# Patient Record
Sex: Male | Born: 1955 | Race: Black or African American | Hispanic: No | Smoking: Former smoker
Health system: Southern US, Community
[De-identification: ages and names within clinical notes are randomized; demographics above are authoritative.]

## PROBLEM LIST (undated history)

## (undated) DIAGNOSIS — K219 Gastro-esophageal reflux disease without esophagitis: Secondary | ICD-10-CM

## (undated) DIAGNOSIS — I10 Essential (primary) hypertension: Secondary | ICD-10-CM

## (undated) DIAGNOSIS — K760 Fatty (change of) liver, not elsewhere classified: Secondary | ICD-10-CM

## (undated) DIAGNOSIS — K831 Obstruction of bile duct: Secondary | ICD-10-CM

## (undated) DIAGNOSIS — E785 Hyperlipidemia, unspecified: Secondary | ICD-10-CM

## (undated) DIAGNOSIS — E119 Type 2 diabetes mellitus without complications: Secondary | ICD-10-CM

## (undated) HISTORY — DX: Hyperlipidemia, unspecified: E78.5

## (undated) HISTORY — PX: CATARACT EXTRACTION: SUR2

## (undated) HISTORY — DX: Obstruction of bile duct: K83.1

## (undated) HISTORY — DX: Gastro-esophageal reflux disease without esophagitis: K21.9

## (undated) HISTORY — DX: Type 2 diabetes mellitus without complications: E11.9

## (undated) HISTORY — DX: Fatty (change of) liver, not elsewhere classified: K76.0

## (undated) HISTORY — DX: Essential (primary) hypertension: I10

## (undated) HISTORY — PX: NECK SURGERY: SHX720

---

## 2015-06-09 DIAGNOSIS — M25562 Pain in left knee: Secondary | ICD-10-CM | POA: Diagnosis not present

## 2015-06-19 DIAGNOSIS — M47812 Spondylosis without myelopathy or radiculopathy, cervical region: Secondary | ICD-10-CM | POA: Diagnosis not present

## 2015-06-19 DIAGNOSIS — M5412 Radiculopathy, cervical region: Secondary | ICD-10-CM | POA: Diagnosis not present

## 2015-06-21 DIAGNOSIS — Z01810 Encounter for preprocedural cardiovascular examination: Secondary | ICD-10-CM | POA: Diagnosis not present

## 2015-06-21 DIAGNOSIS — Z01818 Encounter for other preprocedural examination: Secondary | ICD-10-CM | POA: Diagnosis not present

## 2015-06-21 DIAGNOSIS — M79603 Pain in arm, unspecified: Secondary | ICD-10-CM | POA: Diagnosis not present

## 2015-06-21 DIAGNOSIS — Z79899 Other long term (current) drug therapy: Secondary | ICD-10-CM | POA: Diagnosis not present

## 2015-06-21 DIAGNOSIS — R52 Pain, unspecified: Secondary | ICD-10-CM | POA: Diagnosis not present

## 2015-07-05 DIAGNOSIS — M47892 Other spondylosis, cervical region: Secondary | ICD-10-CM | POA: Diagnosis not present

## 2015-07-05 DIAGNOSIS — M50123 Cervical disc disorder at C6-C7 level with radiculopathy: Secondary | ICD-10-CM | POA: Diagnosis not present

## 2015-07-05 DIAGNOSIS — Z79899 Other long term (current) drug therapy: Secondary | ICD-10-CM | POA: Diagnosis not present

## 2015-07-05 DIAGNOSIS — E119 Type 2 diabetes mellitus without complications: Secondary | ICD-10-CM | POA: Diagnosis not present

## 2015-07-05 DIAGNOSIS — Z23 Encounter for immunization: Secondary | ICD-10-CM | POA: Diagnosis not present

## 2015-07-05 DIAGNOSIS — Z87891 Personal history of nicotine dependence: Secondary | ICD-10-CM | POA: Diagnosis not present

## 2015-07-05 DIAGNOSIS — M5412 Radiculopathy, cervical region: Secondary | ICD-10-CM | POA: Diagnosis not present

## 2015-07-05 DIAGNOSIS — E785 Hyperlipidemia, unspecified: Secondary | ICD-10-CM | POA: Diagnosis not present

## 2015-07-05 DIAGNOSIS — Z7982 Long term (current) use of aspirin: Secondary | ICD-10-CM | POA: Diagnosis not present

## 2015-07-05 DIAGNOSIS — M5013 Cervical disc disorder with radiculopathy, cervicothoracic region: Secondary | ICD-10-CM | POA: Diagnosis not present

## 2015-07-06 DIAGNOSIS — Z87891 Personal history of nicotine dependence: Secondary | ICD-10-CM | POA: Diagnosis not present

## 2015-07-06 DIAGNOSIS — Z79899 Other long term (current) drug therapy: Secondary | ICD-10-CM | POA: Diagnosis not present

## 2015-07-06 DIAGNOSIS — Z23 Encounter for immunization: Secondary | ICD-10-CM | POA: Diagnosis not present

## 2015-07-06 DIAGNOSIS — M5013 Cervical disc disorder with radiculopathy, cervicothoracic region: Secondary | ICD-10-CM | POA: Diagnosis not present

## 2015-07-06 DIAGNOSIS — E119 Type 2 diabetes mellitus without complications: Secondary | ICD-10-CM | POA: Diagnosis not present

## 2015-07-06 DIAGNOSIS — E785 Hyperlipidemia, unspecified: Secondary | ICD-10-CM | POA: Diagnosis not present

## 2015-07-06 DIAGNOSIS — Z7982 Long term (current) use of aspirin: Secondary | ICD-10-CM | POA: Diagnosis not present

## 2015-07-19 DIAGNOSIS — Z0000 Encounter for general adult medical examination without abnormal findings: Secondary | ICD-10-CM | POA: Diagnosis not present

## 2015-09-26 DIAGNOSIS — M5412 Radiculopathy, cervical region: Secondary | ICD-10-CM | POA: Diagnosis not present

## 2015-11-22 DIAGNOSIS — Z1389 Encounter for screening for other disorder: Secondary | ICD-10-CM | POA: Diagnosis not present

## 2015-11-22 DIAGNOSIS — E1165 Type 2 diabetes mellitus with hyperglycemia: Secondary | ICD-10-CM | POA: Diagnosis not present

## 2015-11-22 DIAGNOSIS — N400 Enlarged prostate without lower urinary tract symptoms: Secondary | ICD-10-CM | POA: Diagnosis not present

## 2015-11-22 DIAGNOSIS — E782 Mixed hyperlipidemia: Secondary | ICD-10-CM | POA: Diagnosis not present

## 2015-12-17 DIAGNOSIS — E119 Type 2 diabetes mellitus without complications: Secondary | ICD-10-CM | POA: Diagnosis not present

## 2016-01-01 DIAGNOSIS — M5412 Radiculopathy, cervical region: Secondary | ICD-10-CM | POA: Diagnosis not present

## 2016-02-19 DIAGNOSIS — E1165 Type 2 diabetes mellitus with hyperglycemia: Secondary | ICD-10-CM | POA: Diagnosis not present

## 2016-02-26 DIAGNOSIS — E782 Mixed hyperlipidemia: Secondary | ICD-10-CM | POA: Diagnosis not present

## 2016-02-26 DIAGNOSIS — E1165 Type 2 diabetes mellitus with hyperglycemia: Secondary | ICD-10-CM | POA: Diagnosis not present

## 2016-02-26 DIAGNOSIS — I10 Essential (primary) hypertension: Secondary | ICD-10-CM | POA: Diagnosis not present

## 2016-06-17 DIAGNOSIS — H25813 Combined forms of age-related cataract, bilateral: Secondary | ICD-10-CM | POA: Diagnosis not present

## 2016-07-01 DIAGNOSIS — M5412 Radiculopathy, cervical region: Secondary | ICD-10-CM | POA: Diagnosis not present

## 2016-08-05 DIAGNOSIS — Z131 Encounter for screening for diabetes mellitus: Secondary | ICD-10-CM | POA: Diagnosis not present

## 2016-08-05 DIAGNOSIS — Z0000 Encounter for general adult medical examination without abnormal findings: Secondary | ICD-10-CM | POA: Diagnosis not present

## 2016-08-05 DIAGNOSIS — Z6828 Body mass index (BMI) 28.0-28.9, adult: Secondary | ICD-10-CM | POA: Diagnosis not present

## 2016-08-05 DIAGNOSIS — Z125 Encounter for screening for malignant neoplasm of prostate: Secondary | ICD-10-CM | POA: Diagnosis not present

## 2016-08-12 DIAGNOSIS — E049 Nontoxic goiter, unspecified: Secondary | ICD-10-CM | POA: Diagnosis not present

## 2016-09-04 DIAGNOSIS — R748 Abnormal levels of other serum enzymes: Secondary | ICD-10-CM | POA: Diagnosis not present

## 2016-10-15 DIAGNOSIS — Z6827 Body mass index (BMI) 27.0-27.9, adult: Secondary | ICD-10-CM | POA: Diagnosis not present

## 2016-10-15 DIAGNOSIS — Z23 Encounter for immunization: Secondary | ICD-10-CM | POA: Diagnosis not present

## 2016-10-15 DIAGNOSIS — I10 Essential (primary) hypertension: Secondary | ICD-10-CM | POA: Diagnosis not present

## 2016-10-15 DIAGNOSIS — E1165 Type 2 diabetes mellitus with hyperglycemia: Secondary | ICD-10-CM | POA: Diagnosis not present

## 2016-12-17 DIAGNOSIS — Z1339 Encounter for screening examination for other mental health and behavioral disorders: Secondary | ICD-10-CM | POA: Diagnosis not present

## 2016-12-17 DIAGNOSIS — Z1331 Encounter for screening for depression: Secondary | ICD-10-CM | POA: Diagnosis not present

## 2016-12-17 DIAGNOSIS — E1165 Type 2 diabetes mellitus with hyperglycemia: Secondary | ICD-10-CM | POA: Diagnosis not present

## 2016-12-17 DIAGNOSIS — E782 Mixed hyperlipidemia: Secondary | ICD-10-CM | POA: Diagnosis not present

## 2016-12-22 DIAGNOSIS — E119 Type 2 diabetes mellitus without complications: Secondary | ICD-10-CM | POA: Diagnosis not present

## 2017-03-11 DIAGNOSIS — E1165 Type 2 diabetes mellitus with hyperglycemia: Secondary | ICD-10-CM | POA: Diagnosis not present

## 2017-03-11 DIAGNOSIS — Z6827 Body mass index (BMI) 27.0-27.9, adult: Secondary | ICD-10-CM | POA: Diagnosis not present

## 2017-03-11 DIAGNOSIS — E782 Mixed hyperlipidemia: Secondary | ICD-10-CM | POA: Diagnosis not present

## 2017-03-11 DIAGNOSIS — I10 Essential (primary) hypertension: Secondary | ICD-10-CM | POA: Diagnosis not present

## 2017-03-11 DIAGNOSIS — R5383 Other fatigue: Secondary | ICD-10-CM | POA: Diagnosis not present

## 2017-06-16 DIAGNOSIS — H25813 Combined forms of age-related cataract, bilateral: Secondary | ICD-10-CM | POA: Diagnosis not present

## 2017-07-08 DIAGNOSIS — I10 Essential (primary) hypertension: Secondary | ICD-10-CM | POA: Diagnosis not present

## 2017-07-08 DIAGNOSIS — E1165 Type 2 diabetes mellitus with hyperglycemia: Secondary | ICD-10-CM | POA: Diagnosis not present

## 2017-07-08 DIAGNOSIS — E782 Mixed hyperlipidemia: Secondary | ICD-10-CM | POA: Diagnosis not present

## 2017-07-28 DIAGNOSIS — E119 Type 2 diabetes mellitus without complications: Secondary | ICD-10-CM | POA: Diagnosis not present

## 2017-07-28 DIAGNOSIS — H25812 Combined forms of age-related cataract, left eye: Secondary | ICD-10-CM | POA: Diagnosis not present

## 2017-07-28 DIAGNOSIS — H40013 Open angle with borderline findings, low risk, bilateral: Secondary | ICD-10-CM | POA: Diagnosis not present

## 2017-08-25 DIAGNOSIS — Z79899 Other long term (current) drug therapy: Secondary | ICD-10-CM | POA: Diagnosis not present

## 2017-08-25 DIAGNOSIS — Z87891 Personal history of nicotine dependence: Secondary | ICD-10-CM | POA: Diagnosis not present

## 2017-08-25 DIAGNOSIS — Z7984 Long term (current) use of oral hypoglycemic drugs: Secondary | ICD-10-CM | POA: Diagnosis not present

## 2017-08-25 DIAGNOSIS — I10 Essential (primary) hypertension: Secondary | ICD-10-CM | POA: Diagnosis not present

## 2017-08-25 DIAGNOSIS — Z7982 Long term (current) use of aspirin: Secondary | ICD-10-CM | POA: Diagnosis not present

## 2017-08-25 DIAGNOSIS — H259 Unspecified age-related cataract: Secondary | ICD-10-CM | POA: Diagnosis not present

## 2017-08-25 DIAGNOSIS — H25812 Combined forms of age-related cataract, left eye: Secondary | ICD-10-CM | POA: Diagnosis not present

## 2017-08-25 DIAGNOSIS — E1136 Type 2 diabetes mellitus with diabetic cataract: Secondary | ICD-10-CM | POA: Diagnosis not present

## 2017-08-25 DIAGNOSIS — H40013 Open angle with borderline findings, low risk, bilateral: Secondary | ICD-10-CM | POA: Diagnosis not present

## 2017-10-06 DIAGNOSIS — Z87891 Personal history of nicotine dependence: Secondary | ICD-10-CM | POA: Diagnosis not present

## 2017-10-06 DIAGNOSIS — Z7982 Long term (current) use of aspirin: Secondary | ICD-10-CM | POA: Diagnosis not present

## 2017-10-06 DIAGNOSIS — E1136 Type 2 diabetes mellitus with diabetic cataract: Secondary | ICD-10-CM | POA: Diagnosis not present

## 2017-10-06 DIAGNOSIS — Z79899 Other long term (current) drug therapy: Secondary | ICD-10-CM | POA: Diagnosis not present

## 2017-10-06 DIAGNOSIS — Z7984 Long term (current) use of oral hypoglycemic drugs: Secondary | ICD-10-CM | POA: Diagnosis not present

## 2017-10-06 DIAGNOSIS — H25811 Combined forms of age-related cataract, right eye: Secondary | ICD-10-CM | POA: Diagnosis not present

## 2017-10-06 DIAGNOSIS — I10 Essential (primary) hypertension: Secondary | ICD-10-CM | POA: Diagnosis not present

## 2017-10-07 DIAGNOSIS — Z125 Encounter for screening for malignant neoplasm of prostate: Secondary | ICD-10-CM | POA: Diagnosis not present

## 2017-10-07 DIAGNOSIS — Z0000 Encounter for general adult medical examination without abnormal findings: Secondary | ICD-10-CM | POA: Diagnosis not present

## 2017-10-07 DIAGNOSIS — Z6828 Body mass index (BMI) 28.0-28.9, adult: Secondary | ICD-10-CM | POA: Diagnosis not present

## 2017-10-07 DIAGNOSIS — Z131 Encounter for screening for diabetes mellitus: Secondary | ICD-10-CM | POA: Diagnosis not present

## 2017-11-05 DIAGNOSIS — Z23 Encounter for immunization: Secondary | ICD-10-CM | POA: Diagnosis not present

## 2017-12-02 DIAGNOSIS — E1165 Type 2 diabetes mellitus with hyperglycemia: Secondary | ICD-10-CM | POA: Diagnosis not present

## 2017-12-02 DIAGNOSIS — Z6828 Body mass index (BMI) 28.0-28.9, adult: Secondary | ICD-10-CM | POA: Diagnosis not present

## 2017-12-02 DIAGNOSIS — Z2821 Immunization not carried out because of patient refusal: Secondary | ICD-10-CM | POA: Diagnosis not present

## 2017-12-02 DIAGNOSIS — I10 Essential (primary) hypertension: Secondary | ICD-10-CM | POA: Diagnosis not present

## 2018-03-17 DIAGNOSIS — E1165 Type 2 diabetes mellitus with hyperglycemia: Secondary | ICD-10-CM | POA: Diagnosis not present

## 2018-03-17 DIAGNOSIS — K760 Fatty (change of) liver, not elsewhere classified: Secondary | ICD-10-CM | POA: Diagnosis not present

## 2018-03-17 DIAGNOSIS — Z1331 Encounter for screening for depression: Secondary | ICD-10-CM | POA: Diagnosis not present

## 2018-03-17 DIAGNOSIS — Z6828 Body mass index (BMI) 28.0-28.9, adult: Secondary | ICD-10-CM | POA: Diagnosis not present

## 2018-07-06 DIAGNOSIS — E782 Mixed hyperlipidemia: Secondary | ICD-10-CM | POA: Diagnosis not present

## 2018-07-06 DIAGNOSIS — Z6828 Body mass index (BMI) 28.0-28.9, adult: Secondary | ICD-10-CM | POA: Diagnosis not present

## 2018-07-06 DIAGNOSIS — I10 Essential (primary) hypertension: Secondary | ICD-10-CM | POA: Diagnosis not present

## 2018-07-06 DIAGNOSIS — E1165 Type 2 diabetes mellitus with hyperglycemia: Secondary | ICD-10-CM | POA: Diagnosis not present

## 2018-11-04 DIAGNOSIS — Z9181 History of falling: Secondary | ICD-10-CM | POA: Diagnosis not present

## 2018-11-04 DIAGNOSIS — Z23 Encounter for immunization: Secondary | ICD-10-CM | POA: Diagnosis not present

## 2018-11-04 DIAGNOSIS — Z6829 Body mass index (BMI) 29.0-29.9, adult: Secondary | ICD-10-CM | POA: Diagnosis not present

## 2018-11-04 DIAGNOSIS — Z1331 Encounter for screening for depression: Secondary | ICD-10-CM | POA: Diagnosis not present

## 2018-11-04 DIAGNOSIS — E782 Mixed hyperlipidemia: Secondary | ICD-10-CM | POA: Diagnosis not present

## 2018-11-04 DIAGNOSIS — Z131 Encounter for screening for diabetes mellitus: Secondary | ICD-10-CM | POA: Diagnosis not present

## 2018-11-04 DIAGNOSIS — Z125 Encounter for screening for malignant neoplasm of prostate: Secondary | ICD-10-CM | POA: Diagnosis not present

## 2018-11-04 DIAGNOSIS — Z0000 Encounter for general adult medical examination without abnormal findings: Secondary | ICD-10-CM | POA: Diagnosis not present

## 2018-11-19 DIAGNOSIS — R601 Generalized edema: Secondary | ICD-10-CM | POA: Diagnosis not present

## 2018-11-19 DIAGNOSIS — R609 Edema, unspecified: Secondary | ICD-10-CM | POA: Diagnosis not present

## 2018-11-19 DIAGNOSIS — L0390 Cellulitis, unspecified: Secondary | ICD-10-CM | POA: Diagnosis not present

## 2018-11-25 DIAGNOSIS — Z6828 Body mass index (BMI) 28.0-28.9, adult: Secondary | ICD-10-CM | POA: Diagnosis not present

## 2018-11-25 DIAGNOSIS — R600 Localized edema: Secondary | ICD-10-CM | POA: Diagnosis not present

## 2018-11-25 DIAGNOSIS — I10 Essential (primary) hypertension: Secondary | ICD-10-CM | POA: Diagnosis not present

## 2018-11-26 DIAGNOSIS — M79604 Pain in right leg: Secondary | ICD-10-CM | POA: Diagnosis not present

## 2018-11-26 DIAGNOSIS — R600 Localized edema: Secondary | ICD-10-CM | POA: Diagnosis not present

## 2018-12-10 DIAGNOSIS — E119 Type 2 diabetes mellitus without complications: Secondary | ICD-10-CM | POA: Diagnosis not present

## 2019-04-07 DIAGNOSIS — Z6828 Body mass index (BMI) 28.0-28.9, adult: Secondary | ICD-10-CM | POA: Diagnosis not present

## 2019-04-07 DIAGNOSIS — E1165 Type 2 diabetes mellitus with hyperglycemia: Secondary | ICD-10-CM | POA: Diagnosis not present

## 2019-04-07 DIAGNOSIS — Z1331 Encounter for screening for depression: Secondary | ICD-10-CM | POA: Diagnosis not present

## 2019-04-07 DIAGNOSIS — I10 Essential (primary) hypertension: Secondary | ICD-10-CM | POA: Diagnosis not present

## 2019-04-11 DIAGNOSIS — Z01818 Encounter for other preprocedural examination: Secondary | ICD-10-CM | POA: Diagnosis not present

## 2019-04-29 DIAGNOSIS — Z20828 Contact with and (suspected) exposure to other viral communicable diseases: Secondary | ICD-10-CM | POA: Diagnosis not present

## 2019-04-29 DIAGNOSIS — Z7189 Other specified counseling: Secondary | ICD-10-CM | POA: Diagnosis not present

## 2019-05-06 DIAGNOSIS — Z7982 Long term (current) use of aspirin: Secondary | ICD-10-CM | POA: Diagnosis not present

## 2019-05-06 DIAGNOSIS — K5750 Diverticulosis of both small and large intestine without perforation or abscess without bleeding: Secondary | ICD-10-CM | POA: Diagnosis not present

## 2019-05-06 DIAGNOSIS — Z1211 Encounter for screening for malignant neoplasm of colon: Secondary | ICD-10-CM | POA: Diagnosis not present

## 2019-05-06 DIAGNOSIS — E119 Type 2 diabetes mellitus without complications: Secondary | ICD-10-CM | POA: Diagnosis not present

## 2019-05-06 DIAGNOSIS — I10 Essential (primary) hypertension: Secondary | ICD-10-CM | POA: Diagnosis not present

## 2019-05-06 DIAGNOSIS — Z79899 Other long term (current) drug therapy: Secondary | ICD-10-CM | POA: Diagnosis not present

## 2019-05-06 DIAGNOSIS — K760 Fatty (change of) liver, not elsewhere classified: Secondary | ICD-10-CM | POA: Diagnosis not present

## 2019-05-06 DIAGNOSIS — K621 Rectal polyp: Secondary | ICD-10-CM | POA: Diagnosis not present

## 2019-05-06 DIAGNOSIS — Z7984 Long term (current) use of oral hypoglycemic drugs: Secondary | ICD-10-CM | POA: Diagnosis not present

## 2019-05-27 DIAGNOSIS — K828 Other specified diseases of gallbladder: Secondary | ICD-10-CM | POA: Diagnosis not present

## 2019-05-27 DIAGNOSIS — K760 Fatty (change of) liver, not elsewhere classified: Secondary | ICD-10-CM | POA: Diagnosis not present

## 2020-03-23 DIAGNOSIS — E1142 Type 2 diabetes mellitus with diabetic polyneuropathy: Secondary | ICD-10-CM

## 2020-03-23 DIAGNOSIS — I10 Essential (primary) hypertension: Secondary | ICD-10-CM | POA: Diagnosis present

## 2020-03-23 DIAGNOSIS — K219 Gastro-esophageal reflux disease without esophagitis: Secondary | ICD-10-CM | POA: Insufficient documentation

## 2020-03-23 DIAGNOSIS — E782 Mixed hyperlipidemia: Secondary | ICD-10-CM | POA: Insufficient documentation

## 2020-03-30 DIAGNOSIS — K760 Fatty (change of) liver, not elsewhere classified: Secondary | ICD-10-CM | POA: Insufficient documentation

## 2020-06-26 DIAGNOSIS — R1011 Right upper quadrant pain: Secondary | ICD-10-CM | POA: Insufficient documentation

## 2020-07-13 DIAGNOSIS — K831 Obstruction of bile duct: Secondary | ICD-10-CM | POA: Diagnosis present

## 2020-07-24 DIAGNOSIS — K8689 Other specified diseases of pancreas: Secondary | ICD-10-CM | POA: Insufficient documentation

## 2020-07-27 DIAGNOSIS — C259 Malignant neoplasm of pancreas, unspecified: Secondary | ICD-10-CM | POA: Diagnosis present

## 2020-07-31 LAB — GLUCOSE, POCT (MANUAL RESULT ENTRY): POC Glucose: 49 mg/dl — AB (ref 70–99)

## 2020-08-01 ENCOUNTER — Telehealth: Payer: Self-pay | Admitting: Hematology and Oncology

## 2020-08-01 NOTE — Progress Notes (Signed)
Jeff Jordan  [address] [place],  [place]  [address] [phone]  Clinic Day:  08/02/2020  Referring physician: No ref. provider found   CHIEF COMPLAINT:  CC: A 65 year old male with newly found pancreatic mass here for further evaluation.  Current Treatment:  Diagnostics   HISTORY OF PRESENT ILLNESS:  Jeff Jordan is a 65 y.o. male with a history of pancreatic mass on CT imaging who is referred in consultation with Jeff Jordan, [facility] for assessment and management. His history dates back several months to when he started having low back pain that moved to his abdomen. He then began to have abdominal distention and saw his PCP where he was found to have elevated liver enzymes. At that time, he also began experiencing increased itching. Ultrasound of the abdomen revealed intrahepatic and extrahepatic biliary dilation including a distended gallbladder. He was scheduled for MRCP and had this on 07/23/2020. Findings from this revealed: Diffuse intra and extrahepatic biliary ductal dilatation, due to distal common bile duct stricture in the region of the pancreatic head. Ill-defined soft tissue prominence in the pancreatic head and uncinate process is poorly characterized without IV contrast, but is suspicious for pancreatic carcinoma with focal pancreatitis considered less likely. Consider pancreatic protocol abdomen CT without and with contrast for further evaluation. Mild perihepatic ascites and diffuse mesenteric edema.   CT imaging of abdomen was obtained 07/27/2020 and revealed 1. 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 There is loss of fat plane between this mass and the descending and transverse portions of the duodenum. Cannot exclude duodenal invasion. Within the tail of pancreas there is hypoenhancement and masslike enlargement. This may represent a second tumor, focus of  metastasis, or focal pancreatitis due to main duct obstruction. 2. 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 There is also complete encasement of the superior mesenteric artery. Findings compatible with vascular involvement. 3. No signs of liver involvement. 4. Prominent upper abdominal lymph nodes are identified none of which meet CT criteria for adenopathy. Nonetheless these are somewhat suspicious in the current context and given their multiplicity. 5. There is a complex solid and cystic lesion arising off the inferior pole of the right kidney which is indeterminate. Cannot rule out small renal cell carcinoma. 6. Small volume of free fluid noted along the right pericolic gutter and mild thickening along the peritoneal reflection of the left pericolic gutter. No convincing evidence for peritoneal nodularity at this time.   He presents to clinic today with continued complaints of diarrhea, decreased appetite, weight loss, nausea, abdominal pain, back pain and shortness of breath upon exertion. He works as a Clinical cytogeneticist at [employer] and states it has become more difficult for him to work due to fatigue and weakness. He is taking prescribed Oxycodone 5 mg every 4 hours for his pain, but this is not relieving his pain adequately. He denies fever or chills. His medical history is significant for hypertension, diabetes, GERD and hypercholesterolemia. His surgical history is significant for cataract surgery and neck surgery. He denies any family history of cancer. He used to drink alcohol, but quit in 2002. He used to smoke around a pack per day, but quit about 12 years ago. He lives alone and has one daughter.    REVIEW OF SYSTEMS:  Review of Systems  Constitutional:  Positive for appetite change, fatigue and unexpected  weight change. Negative for chills, diaphoresis and fever.  HENT:   Negative for hearing loss,  lump/mass, mouth sores, nosebleeds, sore throat, tinnitus, trouble swallowing and voice change.   Eyes:  Positive for icterus. Negative for eye problems.  Respiratory:  Positive for shortness of breath. Negative for chest tightness, cough, hemoptysis and wheezing.   Cardiovascular:  Negative for chest pain, leg swelling and palpitations.  Gastrointestinal:  Positive for abdominal distention, abdominal pain and diarrhea. Negative for blood in stool, constipation, nausea, rectal pain and vomiting.  Endocrine: Negative for hot flashes.  Genitourinary:  Negative for bladder incontinence, difficulty urinating, dyspareunia, dysuria, frequency, hematuria and nocturia.   Musculoskeletal:  Positive for back pain. Negative for arthralgias, flank pain, gait problem, myalgias, neck pain and neck stiffness.  Skin:  Positive for itching. Negative for rash and wound.  Neurological:  Negative for dizziness, extremity weakness, gait problem, headaches, light-headedness, numbness, seizures and speech difficulty.  Hematological:  Negative for adenopathy. Does not bruise/bleed easily.  Psychiatric/Behavioral:  Negative for confusion, decreased concentration, depression, sleep disturbance and suicidal ideas. The patient is not nervous/anxious.     VITALS:  Blood pressure (!) 152/61, pulse 90, temperature 97.8 F (36.6 C), temperature source Oral, resp. rate 16, height 5\' 10"  (1.778 m), weight 164 lb 11.2 oz (74.7 kg), SpO2 99 %.  Wt Readings from Last 3 Encounters:  08/02/20 164 lb 11.2 oz (74.7 kg)    Body mass index is 23.63 kg/m.  Performance status (ECOG): 1 - Symptomatic but completely ambulatory  PHYSICAL EXAM:  Physical Exam Constitutional:      General: He is not in acute distress.    Appearance: Normal appearance. He is normal weight. He is not ill-appearing, toxic-appearing or diaphoretic.  HENT:     Head: Normocephalic and atraumatic.     Nose: Nose normal. No congestion or rhinorrhea.      Mouth/Throat:     Mouth: Mucous membranes are moist.     Pharynx: Oropharynx is clear. No oropharyngeal exudate or posterior oropharyngeal erythema.  Eyes:     General: Scleral icterus present.        Right eye: No discharge.        Left eye: No discharge.     Extraocular Movements: Extraocular movements intact.     Conjunctiva/sclera: Conjunctivae normal.     Pupils: Pupils are equal, round, and reactive to light.  Neck:     Vascular: No carotid bruit.  Cardiovascular:     Rate and Rhythm: Normal rate and regular rhythm.     Heart sounds: No murmur heard.   No friction rub. No gallop.  Pulmonary:     Effort: Pulmonary effort is normal. No respiratory distress.     Breath sounds: Normal breath sounds. No stridor. No wheezing, rhonchi or rales.  Chest:     Chest wall: No tenderness.  Abdominal:     General: Bowel sounds are normal. There is distension.     Palpations: There is no mass.     Tenderness: There is abdominal tenderness. There is no right CVA tenderness, left CVA tenderness, guarding or rebound.     Hernia: No hernia is present.  Musculoskeletal:        General: No swelling, tenderness, deformity or signs of injury. Normal range of motion.     Cervical back: Normal range of motion and neck supple. No rigidity or tenderness.     Right lower leg: No edema.     Left lower leg: No edema.  Lymphadenopathy:     Cervical: No cervical adenopathy.  Skin:    General: Skin is warm and dry.     Capillary Refill: Capillary refill takes less than 2 seconds.     Coloration: Skin is jaundiced. Skin is not pale.     Findings: No bruising, erythema, lesion or rash.  Neurological:     General: No focal deficit present.     Mental Status: He is alert and oriented to person, place, and time. Mental status is at baseline.     Cranial Nerves: No cranial nerve deficit.     Sensory: No sensory deficit.     Motor: No weakness.     Coordination: Coordination normal.     Gait: Gait normal.      Deep Tendon Reflexes: Reflexes normal.  Psychiatric:        Mood and Affect: Mood normal.        Behavior: Behavior normal.        Thought Content: Thought content normal.        Judgment: Judgment normal.   Lymph nodes:   There is no cervical, clavicular, axillary or lymphadenopathy.  LABS:  No flowsheet data found. No flowsheet data found.   No results found for: CEA1 / No results found for: CEA1 No results found for: PSA1 No results found for: LOV564 No results found for: CAN125  No results found for: TOTALPROTELP, ALBUMINELP, A1GS, A2GS, BETS, BETA2SER, GAMS, MSPIKE, SPEI No results found for: TIBC, FERRITIN, IRONPCTSAT No results found for: LDH  STUDIES:  No results found.    HISTORY:   Past Medical History:  Diagnosis Date   Biliary obstruction    Essential hypertension    Gastroesophageal reflux    Hepatic steatosis    Mild hyperlipidemia    Type 2 diabetes mellitus ([place])       No family history on file.  Social History:  reports that he quit smoking about 20 years ago. His smoking use included cigarettes. He has a 12.00 pack-year smoking history. He has never used smokeless tobacco. He reports previous alcohol use. He reports previous drug use.The patient is alone  today.  Allergies: No Known Allergies  Current Medications: Current Outpatient Medications  Medication Sig Dispense Refill   amLODipine (NORVASC) 5 MG tablet Take 5 mg by mouth daily.     aspirin 81 MG EC tablet Take by mouth.     JARDIANCE 25 MG TABS tablet Take 25 mg by mouth daily.     metFORMIN (GLUCOPHAGE) 1000 MG tablet Take 1,000 mg by mouth 2 (two) times daily.     No current facility-administered medications for this visit.     ASSESSMENT & PLAN:   Assessment:  Jeff Jordan is a 65 y.o. male with newly found pancreatic mass and biliary obstruction. Total bilirubin is 2.4. We discussed the findings of the CT today and the urgency of the need for biopsy and biliary stent  placement. We also discussed overall treatment planning to include chemotherapy should the biopsy confirm malignancy. The patient is agreeable to moving forward with continued diagnostics as well as following treatment.   Plan: 1.  We will refer to [facility] for evaluation and likely ERCP with biopsy and biliary stent placement. Once pathology results have been obtained, we will see the patient back in the office with one of the attendings for further treatment planning. I will send in an increase in pain medicine today as well as zofran for nausea. I also provided him  a sample of anti-diarrheal medication.   He verbalizes understanding of and agreement to the plans discussed today. He knows to call the office should any new questions or concerns arise.     Melodye Ped, NP

## 2020-08-01 NOTE — Telephone Encounter (Signed)
Patient referred by Barnie Del, PA-C for Pancreatic Mass.  Appt made for 08/02/20 Labs 10:00 am - Consult 10:30 am

## 2020-08-02 ENCOUNTER — Inpatient Hospital Stay: Payer: Medicare HMO

## 2020-08-02 ENCOUNTER — Other Ambulatory Visit: Payer: Self-pay

## 2020-08-02 ENCOUNTER — Encounter: Payer: Self-pay | Admitting: Hematology and Oncology

## 2020-08-02 ENCOUNTER — Other Ambulatory Visit: Payer: Self-pay | Admitting: Hematology and Oncology

## 2020-08-02 ENCOUNTER — Inpatient Hospital Stay: Payer: Medicare HMO | Attending: Hematology and Oncology | Admitting: Hematology and Oncology

## 2020-08-02 VITALS — BP 152/61 | HR 90 | Temp 97.8°F | Resp 16 | Ht 70.0 in | Wt 164.7 lb

## 2020-08-02 DIAGNOSIS — K8689 Other specified diseases of pancreas: Secondary | ICD-10-CM

## 2020-08-02 MED ORDER — ONDANSETRON HCL 4 MG PO TABS: 4.0000 mg | ORAL_TABLET | ORAL | 3 refills | Status: DC | PRN

## 2020-08-02 MED ORDER — OXYCODONE HCL 10 MG PO TABS: 10.0000 mg | ORAL_TABLET | ORAL | 0 refills | Status: AC | PRN

## 2020-08-03 ENCOUNTER — Telehealth: Payer: Self-pay | Admitting: Gastroenterology

## 2020-08-03 NOTE — Telephone Encounter (Signed)
Hi Dr. Rush Landmark, we have received an urgent referral from the [facility]. For pancreatic mass and biliary obstruction. Records will be sent to you for review. Please advise on scheduling. Thank you.

## 2020-08-06 ENCOUNTER — Other Ambulatory Visit: Payer: Self-pay

## 2020-08-06 DIAGNOSIS — K831 Obstruction of bile duct: Secondary | ICD-10-CM

## 2020-08-06 NOTE — Telephone Encounter (Signed)
I had an opportunity to review the chart and imaging now that I am back in the office (was out of the office on Friday afternoon). Biliary obstruction with likely pancreatic versus duodenal mass causing obstruction. Bilirubin on 5 July was already greater than 10. Patient needs next available EUS/ERCP versus ERCP. I am willing to do this next week on my purple week (Tuesday/Wednesday/Thursday) at either hospital at lunchtime first available 1230. If DJ has something sooner than this he certainly can be offered that slot. If the patient has progressive symptoms or develops fevers or chills he needs to come to the hospital as he may end up needing a hospital-based ERCP (placing CG on this since he is the hospital ERCP used). Patty, please move forward with scheduling. Please update NP Ronne Binning to when the patient is scheduled. Thanks. GM

## 2020-08-06 NOTE — Telephone Encounter (Signed)
Thanks Patty. GM 

## 2020-08-06 NOTE — Telephone Encounter (Signed)
ERCP scheduled for 08/14/20 at 1230 pm with GM at [facility].    Instructions mailed to the home and given to the pt verbally over the phone.    The pt has been advised to call if his symptoms worsen or if he develops fever or chills.

## 2020-08-08 ENCOUNTER — Encounter (HOSPITAL_COMMUNITY): Payer: Self-pay | Admitting: Gastroenterology

## 2020-08-08 ENCOUNTER — Other Ambulatory Visit: Payer: Self-pay

## 2020-08-14 ENCOUNTER — Encounter (HOSPITAL_COMMUNITY)
Admission: RE | Disposition: A | Discharge status: Home Health | Payer: Self-pay | Source: Home / Self Care | Attending: Student

## 2020-08-14 ENCOUNTER — Other Ambulatory Visit: Payer: Self-pay

## 2020-08-14 ENCOUNTER — Ambulatory Visit (HOSPITAL_COMMUNITY): Payer: Medicare HMO

## 2020-08-14 ENCOUNTER — Ambulatory Visit (HOSPITAL_COMMUNITY): Payer: Medicare HMO | Admitting: Anesthesiology

## 2020-08-14 ENCOUNTER — Encounter (HOSPITAL_COMMUNITY): Payer: Self-pay | Admitting: Gastroenterology

## 2020-08-14 ENCOUNTER — Inpatient Hospital Stay (HOSPITAL_COMMUNITY)
Admission: RE | Admit: 2020-08-14 | Discharge: 2020-09-06 | DRG: 435 | Disposition: A | Discharge status: Home Health | Payer: Medicare HMO | Attending: Internal Medicine | Admitting: Internal Medicine

## 2020-08-14 DIAGNOSIS — K8689 Other specified diseases of pancreas: Secondary | ICD-10-CM

## 2020-08-14 DIAGNOSIS — K2971 Gastritis, unspecified, with bleeding: Secondary | ICD-10-CM | POA: Diagnosis not present

## 2020-08-14 DIAGNOSIS — E1165 Type 2 diabetes mellitus with hyperglycemia: Secondary | ICD-10-CM | POA: Diagnosis present

## 2020-08-14 DIAGNOSIS — C250 Malignant neoplasm of head of pancreas: Principal | ICD-10-CM | POA: Diagnosis present

## 2020-08-14 DIAGNOSIS — K8050 Calculus of bile duct without cholangitis or cholecystitis without obstruction: Secondary | ICD-10-CM

## 2020-08-14 DIAGNOSIS — R627 Adult failure to thrive: Secondary | ICD-10-CM | POA: Diagnosis present

## 2020-08-14 DIAGNOSIS — R079 Chest pain, unspecified: Secondary | ICD-10-CM

## 2020-08-14 DIAGNOSIS — K315 Obstruction of duodenum: Secondary | ICD-10-CM

## 2020-08-14 DIAGNOSIS — D490 Neoplasm of unspecified behavior of digestive system: Secondary | ICD-10-CM | POA: Diagnosis not present

## 2020-08-14 DIAGNOSIS — K922 Gastrointestinal hemorrhage, unspecified: Secondary | ICD-10-CM | POA: Diagnosis not present

## 2020-08-14 DIAGNOSIS — E785 Hyperlipidemia, unspecified: Secondary | ICD-10-CM | POA: Diagnosis present

## 2020-08-14 DIAGNOSIS — E872 Acidosis: Secondary | ICD-10-CM | POA: Diagnosis not present

## 2020-08-14 DIAGNOSIS — E43 Unspecified severe protein-calorie malnutrition: Secondary | ICD-10-CM | POA: Insufficient documentation

## 2020-08-14 DIAGNOSIS — E8770 Fluid overload, unspecified: Secondary | ICD-10-CM | POA: Diagnosis present

## 2020-08-14 DIAGNOSIS — Z4659 Encounter for fitting and adjustment of other gastrointestinal appliance and device: Secondary | ICD-10-CM

## 2020-08-14 DIAGNOSIS — K219 Gastro-esophageal reflux disease without esophagitis: Secondary | ICD-10-CM | POA: Diagnosis present

## 2020-08-14 DIAGNOSIS — K760 Fatty (change of) liver, not elsewhere classified: Secondary | ICD-10-CM | POA: Diagnosis present

## 2020-08-14 DIAGNOSIS — K311 Adult hypertrophic pyloric stenosis: Secondary | ICD-10-CM | POA: Diagnosis present

## 2020-08-14 DIAGNOSIS — E119 Type 2 diabetes mellitus without complications: Secondary | ICD-10-CM | POA: Diagnosis not present

## 2020-08-14 DIAGNOSIS — C259 Malignant neoplasm of pancreas, unspecified: Secondary | ICD-10-CM | POA: Diagnosis present

## 2020-08-14 DIAGNOSIS — K831 Obstruction of bile duct: Secondary | ICD-10-CM | POA: Diagnosis present

## 2020-08-14 DIAGNOSIS — Z79899 Other long term (current) drug therapy: Secondary | ICD-10-CM

## 2020-08-14 DIAGNOSIS — Z0189 Encounter for other specified special examinations: Secondary | ICD-10-CM

## 2020-08-14 DIAGNOSIS — Z7984 Long term (current) use of oral hypoglycemic drugs: Secondary | ICD-10-CM

## 2020-08-14 DIAGNOSIS — Z538 Procedure and treatment not carried out for other reasons: Secondary | ICD-10-CM | POA: Diagnosis not present

## 2020-08-14 DIAGNOSIS — Z20822 Contact with and (suspected) exposure to covid-19: Secondary | ICD-10-CM | POA: Diagnosis present

## 2020-08-14 DIAGNOSIS — R509 Fever, unspecified: Secondary | ICD-10-CM

## 2020-08-14 DIAGNOSIS — E871 Hypo-osmolality and hyponatremia: Secondary | ICD-10-CM | POA: Diagnosis present

## 2020-08-14 DIAGNOSIS — I10 Essential (primary) hypertension: Secondary | ICD-10-CM | POA: Diagnosis present

## 2020-08-14 DIAGNOSIS — Z7982 Long term (current) use of aspirin: Secondary | ICD-10-CM

## 2020-08-14 DIAGNOSIS — E11649 Type 2 diabetes mellitus with hypoglycemia without coma: Secondary | ICD-10-CM | POA: Diagnosis not present

## 2020-08-14 DIAGNOSIS — D638 Anemia in other chronic diseases classified elsewhere: Secondary | ICD-10-CM | POA: Diagnosis present

## 2020-08-14 DIAGNOSIS — K8309 Other cholangitis: Secondary | ICD-10-CM

## 2020-08-14 DIAGNOSIS — E1142 Type 2 diabetes mellitus with diabetic polyneuropathy: Secondary | ICD-10-CM

## 2020-08-14 DIAGNOSIS — E861 Hypovolemia: Secondary | ICD-10-CM | POA: Diagnosis not present

## 2020-08-14 DIAGNOSIS — D509 Iron deficiency anemia, unspecified: Secondary | ICD-10-CM | POA: Diagnosis not present

## 2020-08-14 DIAGNOSIS — E876 Hypokalemia: Secondary | ICD-10-CM | POA: Diagnosis not present

## 2020-08-14 DIAGNOSIS — B370 Candidal stomatitis: Secondary | ICD-10-CM | POA: Diagnosis not present

## 2020-08-14 DIAGNOSIS — A4189 Other specified sepsis: Secondary | ICD-10-CM | POA: Diagnosis not present

## 2020-08-14 DIAGNOSIS — E870 Hyperosmolality and hypernatremia: Secondary | ICD-10-CM | POA: Diagnosis not present

## 2020-08-14 DIAGNOSIS — Z87891 Personal history of nicotine dependence: Secondary | ICD-10-CM

## 2020-08-14 DIAGNOSIS — T85598A Other mechanical complication of other gastrointestinal prosthetic devices, implants and grafts, initial encounter: Secondary | ICD-10-CM

## 2020-08-14 DIAGNOSIS — E440 Moderate protein-calorie malnutrition: Secondary | ICD-10-CM | POA: Diagnosis present

## 2020-08-14 DIAGNOSIS — R1110 Vomiting, unspecified: Secondary | ICD-10-CM

## 2020-08-14 DIAGNOSIS — Z6821 Body mass index (BMI) 21.0-21.9, adult: Secondary | ICD-10-CM

## 2020-08-14 HISTORY — PX: FINE NEEDLE ASPIRATION: SHX5430

## 2020-08-14 HISTORY — PX: ESOPHAGOGASTRODUODENOSCOPY: SHX5428

## 2020-08-14 HISTORY — PX: ENDOSCOPIC RETROGRADE CHOLANGIOPANCREATOGRAPHY (ERCP) WITH PROPOFOL: SHX5810

## 2020-08-14 HISTORY — PX: BIOPSY: SHX5522

## 2020-08-14 HISTORY — PX: EUS: SHX5427

## 2020-08-14 LAB — CBC WITH DIFFERENTIAL/PLATELET
Abs Immature Granulocytes: 0.04 10*3/uL (ref 0.00–0.07)
Basophils Absolute: 0 10*3/uL (ref 0.0–0.1)
Basophils Relative: 1 %
Eosinophils Absolute: 0 10*3/uL (ref 0.0–0.5)
Eosinophils Relative: 0 %
HCT: 37.6 % — ABNORMAL LOW (ref 39.0–52.0)
Hemoglobin: 13.1 g/dL (ref 13.0–17.0)
Immature Granulocytes: 1 %
Lymphocytes Relative: 10 %
Lymphs Abs: 0.6 10*3/uL — ABNORMAL LOW (ref 0.7–4.0)
MCH: 28.2 pg (ref 26.0–34.0)
MCHC: 34.8 g/dL (ref 30.0–36.0)
MCV: 80.9 fL (ref 80.0–100.0)
Monocytes Absolute: 0.1 10*3/uL (ref 0.1–1.0)
Monocytes Relative: 2 %
Neutro Abs: 5 10*3/uL (ref 1.7–7.7)
Neutrophils Relative %: 86 %
Platelets: 264 10*3/uL (ref 150–400)
RBC: 4.65 MIL/uL (ref 4.22–5.81)
RDW: 25.1 % — ABNORMAL HIGH (ref 11.5–15.5)
WBC: 5.7 10*3/uL (ref 4.0–10.5)
nRBC: 0 % (ref 0.0–0.2)

## 2020-08-14 LAB — COMPREHENSIVE METABOLIC PANEL
ALT: 115 U/L — ABNORMAL HIGH (ref 0–44)
AST: 163 U/L — ABNORMAL HIGH (ref 15–41)
Albumin: 2.9 g/dL — ABNORMAL LOW (ref 3.5–5.0)
Alkaline Phosphatase: 166 U/L — ABNORMAL HIGH (ref 38–126)
Anion gap: 17 — ABNORMAL HIGH (ref 5–15)
BUN: 26 mg/dL — ABNORMAL HIGH (ref 8–23)
CO2: 26 mmol/L (ref 22–32)
Calcium: 9.6 mg/dL (ref 8.9–10.3)
Chloride: 99 mmol/L (ref 98–111)
Creatinine, Ser: 1.15 mg/dL (ref 0.61–1.24)
GFR, Estimated: >60 mL/min (ref >60)
Glucose, Bld: 160 mg/dL — ABNORMAL HIGH (ref 70–99)
Potassium: 4.9 mmol/L (ref 3.5–5.1)
Sodium: 142 mmol/L (ref 135–145)
Total Bilirubin: 18 mg/dL — ABNORMAL HIGH (ref 0.3–1.2)
Total Protein: 6.9 g/dL (ref 6.5–8.1)

## 2020-08-14 LAB — ABO/RH: ABO/RH(D): AB POS

## 2020-08-14 LAB — TYPE AND SCREEN
ABO/RH(D): AB POS
Antibody Screen: NEGATIVE

## 2020-08-14 LAB — GLUCOSE, CAPILLARY
Glucose-Capillary: 116 mg/dL — ABNORMAL HIGH (ref 70–99)
Glucose-Capillary: 171 mg/dL — ABNORMAL HIGH (ref 70–99)

## 2020-08-14 LAB — PROTIME-INR
INR: 1.7 — ABNORMAL HIGH (ref 0.8–1.2)
Prothrombin Time: 19.6 seconds — ABNORMAL HIGH (ref 11.4–15.2)

## 2020-08-14 SURGERY — ENDOSCOPIC RETROGRADE CHOLANGIOPANCREATOGRAPHY (ERCP) WITH PROPOFOL
Anesthesia: General

## 2020-08-14 MED ORDER — INSULIN ASPART 100 UNIT/ML IJ SOLN
0.0000 [IU] | Freq: Three times a day (TID) | INTRAMUSCULAR | Status: DC
Start: 2020-08-15 — End: 2020-08-20
  Administered 2020-08-16: 1 [IU] via SUBCUTANEOUS
  Administered 2020-08-17 (×2): 2 [IU] via SUBCUTANEOUS
  Administered 2020-08-17 – 2020-08-18 (×3): 1 [IU] via SUBCUTANEOUS
  Administered 2020-08-18: 2 [IU] via SUBCUTANEOUS
  Administered 2020-08-19: 3 [IU] via SUBCUTANEOUS
  Administered 2020-08-19: 2 [IU] via SUBCUTANEOUS
  Administered 2020-08-19: 1 [IU] via SUBCUTANEOUS
  Administered 2020-08-20 (×2): 2 [IU] via SUBCUTANEOUS

## 2020-08-14 MED ORDER — MIDAZOLAM HCL 2 MG/2ML IJ SOLN
INTRAMUSCULAR | Status: AC
  Filled 2020-08-14: qty 2

## 2020-08-14 MED ORDER — FENTANYL CITRATE (PF) 100 MCG/2ML IJ SOLN
INTRAMUSCULAR | Status: DC | PRN
  Administered 2020-08-14 (×2): 50 ug via INTRAVENOUS

## 2020-08-14 MED ORDER — MIDAZOLAM HCL 5 MG/5ML IJ SOLN
INTRAMUSCULAR | Status: DC | PRN
  Administered 2020-08-14: 1 mg via INTRAVENOUS

## 2020-08-14 MED ORDER — INDOMETHACIN 50 MG RE SUPP
RECTAL | Status: DC | PRN
  Administered 2020-08-14: 100 mg via RECTAL

## 2020-08-14 MED ORDER — GLUCAGON HCL RDNA (DIAGNOSTIC) 1 MG IJ SOLR
INTRAMUSCULAR | Status: AC
  Filled 2020-08-14: qty 1

## 2020-08-14 MED ORDER — LIDOCAINE 2% (20 MG/ML) 5 ML SYRINGE
INTRAMUSCULAR | Status: DC | PRN
  Administered 2020-08-14: 60 mg via INTRAVENOUS

## 2020-08-14 MED ORDER — SUGAMMADEX SODIUM 200 MG/2ML IV SOLN
INTRAVENOUS | Status: DC | PRN
  Administered 2020-08-14: 200 mg via INTRAVENOUS

## 2020-08-14 MED ORDER — HYDRALAZINE HCL 20 MG/ML IJ SOLN
10.0000 mg | INTRAMUSCULAR | Status: DC | PRN
  Administered 2020-08-19 – 2020-08-29 (×3): 10 mg via INTRAVENOUS
  Filled 2020-08-14 (×3): qty 1

## 2020-08-14 MED ORDER — FENTANYL CITRATE (PF) 100 MCG/2ML IJ SOLN
INTRAMUSCULAR | Status: AC
  Filled 2020-08-14: qty 2

## 2020-08-14 MED ORDER — FERROUS SULFATE 325 (65 FE) MG PO TABS
325.0000 mg | ORAL_TABLET | Freq: Every day | ORAL | Status: DC
  Administered 2020-08-17 – 2020-09-06 (×19): 325 mg via ORAL
  Filled 2020-08-14 (×20): qty 1

## 2020-08-14 MED ORDER — CIPROFLOXACIN IN D5W 400 MG/200ML IV SOLN
INTRAVENOUS | Status: AC
  Filled 2020-08-14: qty 200

## 2020-08-14 MED ORDER — ROSUVASTATIN CALCIUM 5 MG PO TABS
5.0000 mg | ORAL_TABLET | ORAL | Status: DC
  Administered 2020-08-18: 5 mg via ORAL
  Filled 2020-08-14 (×2): qty 1

## 2020-08-14 MED ORDER — PROPOFOL 10 MG/ML IV BOLUS
INTRAVENOUS | Status: DC | PRN
  Administered 2020-08-14: 180 mg via INTRAVENOUS

## 2020-08-14 MED ORDER — PANTOPRAZOLE SODIUM 40 MG PO TBEC
40.0000 mg | DELAYED_RELEASE_TABLET | Freq: Every day | ORAL | Status: DC
  Administered 2020-08-14: 40 mg via ORAL
  Filled 2020-08-14: qty 1

## 2020-08-14 MED ORDER — GLUCAGON HCL RDNA (DIAGNOSTIC) 1 MG IJ SOLR
INTRAMUSCULAR | Status: DC | PRN
  Administered 2020-08-14: .25 mg via INTRAVENOUS

## 2020-08-14 MED ORDER — INDOMETHACIN 50 MG RE SUPP
RECTAL | Status: AC
  Filled 2020-08-14: qty 2

## 2020-08-14 MED ORDER — AMLODIPINE BESYLATE 5 MG PO TABS
5.0000 mg | ORAL_TABLET | Freq: Every day | ORAL | Status: DC
  Administered 2020-08-16 – 2020-08-28 (×11): 5 mg via ORAL
  Filled 2020-08-14 (×12): qty 1

## 2020-08-14 MED ORDER — PROPOFOL 10 MG/ML IV BOLUS
INTRAVENOUS | Status: AC
  Filled 2020-08-14: qty 20

## 2020-08-14 MED ORDER — LACTATED RINGERS IV SOLN: INTRAVENOUS | Status: DC

## 2020-08-14 MED ORDER — SODIUM CHLORIDE 0.9 % IV SOLN: INTRAVENOUS | Status: DC

## 2020-08-14 MED ORDER — ONDANSETRON HCL 4 MG/2ML IJ SOLN
INTRAMUSCULAR | Status: DC | PRN
  Administered 2020-08-14: 4 mg via INTRAVENOUS

## 2020-08-14 MED ORDER — CIPROFLOXACIN IN D5W 400 MG/200ML IV SOLN
INTRAVENOUS | Status: DC | PRN
  Administered 2020-08-14: 400 mg via INTRAVENOUS

## 2020-08-14 MED ORDER — ROCURONIUM BROMIDE 10 MG/ML (PF) SYRINGE
PREFILLED_SYRINGE | INTRAVENOUS | Status: DC | PRN
  Administered 2020-08-14: 10 mg via INTRAVENOUS
  Administered 2020-08-14: 50 mg via INTRAVENOUS

## 2020-08-14 MED ORDER — SODIUM CHLORIDE 0.9 % IV SOLN
2.0000 g | INTRAVENOUS | Status: AC
  Administered 2020-08-16: 2 g via INTRAVENOUS
  Filled 2020-08-14: qty 2

## 2020-08-14 MED ORDER — DEXAMETHASONE SODIUM PHOSPHATE 10 MG/ML IJ SOLN
INTRAMUSCULAR | Status: DC | PRN
  Administered 2020-08-14: 4 mg via INTRAVENOUS

## 2020-08-14 MED ORDER — OXYCODONE HCL 5 MG PO TABS
10.0000 mg | ORAL_TABLET | Freq: Four times a day (QID) | ORAL | Status: DC | PRN
  Administered 2020-08-14 – 2020-08-19 (×8): 10 mg via ORAL
  Filled 2020-08-14 (×8): qty 2

## 2020-08-14 NOTE — Progress Notes (Addendum)
IR was requested for image guided percutaneous biliary tube placement.   Dr. Serafina Royals was contacted by GI, procedure can be done either tomorrow or day after tomorrow per GI.  The procedure is tentatively scheduled for Thursday pending IR schedule.  Made npo at midnight on Thursday.  Cefoxitin ordered.  INR, CBC, CMP ordered by primary team, will review result.   Patient to be remained off anticoagulation and/or antiplatelet treatment until percutaneous biliary tube placement is done with IR.  Please call IR for questions and concerns.  Formal consult to follow.  [name] Shreyansh Tiffany PA-C 08/14/2020 4:17 PM

## 2020-08-14 NOTE — Op Note (Addendum)
[facility] Patient Name: Jeff Jordan Procedure Date: 08/14/2020 MRN: [id number] Attending MD: Justice Britain , MD Date of Birth: Aug 02, 1955 CSN: [id number] Age: 65 Admit Type: Outpatient Procedure:                Upper EUS Indications:              Esophageal mucosal mass/polyp found on endoscopy Providers:                Justice Britain, MD, Elmer Ramp. Tilden Dome, RN,                            Laverda Sorenson, Technician, Arnoldo Hooker, CRNA Referring MD:              Medicines:                General Anesthesia Complications:            No immediate complications. Estimated Blood Loss:     Estimated blood loss was minimal. Procedure:                Pre-Anesthesia Assessment:                           - Prior to the procedure, a History and Physical                            was performed, and patient medications and                            allergies were reviewed. The patient's tolerance of                            previous anesthesia was also reviewed. The risks                            and benefits of the procedure and the sedation                            options and risks were discussed with the patient.                            All questions were answered, and informed consent                            was obtained. Prior Anticoagulants: The patient has                            taken no previous anticoagulant or antiplatelet                            agents. ASA Grade Assessment: III - A patient with                            severe systemic disease. After reviewing the risks  and benefits, the patient was deemed in                            satisfactory condition to undergo the procedure.                           After obtaining informed consent, the endoscope was                            passed under direct vision. Throughout the                            procedure, the patient's blood pressure, pulse, and                             oxygen saturations were monitored continuously. The                            [id number]-[id number] ([id number]) Olympus gastroscope was                            introduced through the mouth, and advanced to the                            second part of duodenum. The [id number] ([id number])                            Olympus Linear EUS was introduced through the                            mouth, and advanced to the duodenum for ultrasound                            examination from the stomach and duodenum. The                            upper EUS was somewhat difficult due to unusual                            anatomy. Successful completion of the procedure was                            aided by performing the maneuvers documented                            (below) in this report. The patient tolerated the                            procedure. Scope In: Scope Out: Findings:      ENDOSCOPIC FINDING: :      No gross lesions were noted in the entire esophagus.      The Z-line was regular and was found 39 cm from the incisors.      Hematin (altered blood/coffee-ground-like material) was found in the  entire examined stomach. Lavage of the area was performed using a large       amount, resulting in clearance with adequate visualization.      Scattered moderate inflammation with hemorrhage characterized by       erosions, erythema and granularity was found in the entire examined       stomach. Biopsies were taken with a cold forceps for histology and       Helicobacter pylori testing.      An acquired malignant-appearing, intrinsic severe stenosis was found in       the D1/D2 angle. The region is approximately 8 mm in diameter and after       gentle traction with the adult endoscope, I could traverse this area.       There seemed to be 2 separate other areas of malignant appearing       stenoses in D2 and at the presumed ampullary region (though I never       could see the ampulla  fully). Biopsies were taken with a cold forceps       for histology to understand invasiveness.      ENDOSONOGRAPHIC FINDING: :      An irregular mass-like was identified in the pancreatic head. The area       was hypoechoic. It measured 27 mm by 23 mm in maximal cross-sectional       diameter. The outer margins were irregular. There was sonographic       evidence suggesting invasion into the superior mesenteric vein       (manifested by encasement). An intact interface was seen between the       mass and the celiac trunk suggesting a lack of invasion. The remainder       of the pancreas was examined. The endosonographic appearance of       parenchyma and the upstream pancreatic duct indicated duct dilation (PDH       - 4.5 mm, PDN - 5.5 mm, PDB - 5.4 mm, PDT - 2.8 mm) and parenchymal       atrophy. Fine needle biopsy was performed of the mass. Color Doppler       imaging was utilized prior to needle puncture to confirm a lack of       significant vascular structures within the needle path. Six passes were       made with the Acquire 22 gauge ultrasound biopsy needle using a       transduodenal approach. Visible cores of tissue were obtained. A       preliminary cytologic examination was performed. Final cytology results       are pending.      There was dilation in the common bile duct and in the common hepatic       duct.      No malignant appearing lymph nodes were visualized in the celiac region       (level 20), peripancreatic region and porta hepatis region.      The celiac region was visualized. Impression:               EGD Impression:                           - No gross lesions in esophagus. Z-line regular, 39  cm from the incisors.                           - Hematin (altered blood/coffee-ground-like                            material) in the entire stomach. Gastritis with                            hemorrhage. Biopsied.                           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 Biopsied regions that had                            invasive appearance.                           EUS Impression:                           - A mass-like area was identified in the pancreatic                            head. Tissue was obtained from this exam, and                            results are pending. However, the endosonographic                            appearance is highly suspicious for adenocarcinoma.                            There is encasement of the superior mesenteric vein                            at a minimum, but currently the SMA and Celiac look                            uninvolved. This was staged T2 N0 Mx by                            endosonographic criteria. The staging applies if                            malignancy is confirmed. Fine needle biopsy                            performed.                           -  There was dilation in the common bile duct and in                            the common hepatic duct.                           - No malignant appearing lymph nodes were                            visualized in the celiac region (level 20),                            peripancreatic region and porta hepatis region. Moderate Sedation:      Not Applicable - Patient had care per Anesthesia. Recommendation:           - Proceed to scheduled ERCP for attempt at                            decompression.                           - Observe patient's clinical course.                           - Await cytology results and await path results.                           - The findings and recommendations were discussed                            with the patient.                           - The findings and recommendations were discussed                            with the patient's  family. Procedure Code(s):        --- Professional ---                           [phone], Esophagogastroduodenoscopy, flexible,                            transoral; with transendoscopic ultrasound-guided                            intramural or transmural fine needle                            aspiration/biopsy(s), (includes endoscopic                            ultrasound examination limited to the esophagus,                            stomach or duodenum, and adjacent structures) Diagnosis Code(s):        ---  Professional ---                           K92.2, Gastrointestinal hemorrhage, unspecified                           K29.71, Gastritis, unspecified, with bleeding                           K31.5, Obstruction of duodenum                           K86.89, Other specified diseases of pancreas                           K22.8, Other specified diseases of esophagus                           K83.8, Other specified diseases of biliary tract CPT copyright 2019 [employer]. All rights reserved. The codes documented in this report are preliminary and upon coder review may  be revised to meet current compliance requirements. Justice Britain, MD 08/14/2020 5:22:50 PM Number of Addenda: 0

## 2020-08-14 NOTE — H&P (Signed)
History and Physical    Jeff Jordan GNF:[id number] DOB: 05-12-1955 DOA: 08/14/2020  PCP: Georganna Skeans, PA-C  Patient coming from: Home.  Chief Complaint: Abdominal pain.  HPI: Jeff Jordan is a 65 y.o. 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 Rush Landmark for stent placement for obstruction and had ERCP done today during which stent was attempted to be placed for the biliary obstruction but was not able to be placed and patient was referred for admission for IR consult.  Patient states over the last few days he has been having poor appetite with some nausea vomiting.  Denies any fever chills.  ED Course: Patient is a direct admit.  Review of Systems: As per HPI, rest all negative.   Past Medical History:  Diagnosis Date   Biliary obstruction    Essential hypertension    Gastroesophageal reflux    Hepatic steatosis    Mild hyperlipidemia    Type 2 diabetes mellitus ([place])     Past Surgical History:  Procedure Laterality Date   CATARACT EXTRACTION Bilateral    NECK SURGERY       reports that he quit smoking about 20 years ago. His smoking use included cigarettes. He has a 12.00 pack-year smoking history. He has never used smokeless tobacco. He reports previous alcohol use. He reports previous drug use.  No Known Allergies  Family History  Family history unknown: Yes    Prior to Admission medications   Medication Sig Start Date End Date Taking? Authorizing Provider  amLODipine (NORVASC) 5 MG tablet Take 5 mg by mouth daily. 06/19/20  Yes [provider]  aspirin 81 MG EC tablet Take 81 mg by mouth daily.   Yes [provider]  ferrous sulfate 325 (65 FE) MG tablet Take 325 mg by mouth daily with breakfast.   Yes [provider]  JARDIANCE 25 MG TABS tablet Take 25 mg by mouth daily. 07/23/20  Yes [provider]  loperamide (IMODIUM) 2  MG capsule Take by mouth as needed for diarrhea or loose stools.   Yes [provider]  metFORMIN (GLUCOPHAGE) 1000 MG tablet Take 1,000 mg by mouth 2 (two) times daily. 07/16/20  Yes [provider]  olmesartan (BENICAR) 40 MG tablet Take 40 mg by mouth daily. 06/25/20  Yes [provider]  omeprazole (PRILOSEC) 40 MG capsule Take 40 mg by mouth every morning. 03/01/20  Yes [provider]  ondansetron (ZOFRAN) 4 MG tablet Take 1 tablet (4 mg total) by mouth every 4 (four) hours as needed for nausea. 08/02/20  Yes Dayton Scrape A, NP  Oxycodone HCl 10 MG TABS Take 1 tablet (10 mg total) by mouth every 4 (four) hours as needed. Patient taking differently: Take 10 mg by mouth every 4 (four) hours as needed (pain). 08/02/20  Yes Dayton Scrape A, NP  pioglitazone (ACTOS) 45 MG tablet Take 45 mg by mouth daily. 06/25/20  Yes [provider]  rosuvastatin (CRESTOR) 5 MG tablet Take 5 mg by mouth once a week. 06/25/20  Yes [provider]    Physical Exam: Constitutional: Moderately built and nourished. Vitals:   08/14/20 1452 08/14/20 1500 08/14/20 1510 08/14/20 1536  BP: (!) 161/57 (!) 152/61 (!) 151/58 (!) 147/65  Pulse: 91 86 89 83  Resp: 14 16 15 16   Temp: 97.7 F (36.5 C)   97.6 F (36.4 C)  TempSrc: Axillary   Oral  SpO2:  100% 100% 99% 100%  Weight:      Height:       Eyes: Icterus present no pallor. ENMT: No discharge from the ears eyes nose and mouth. Neck: No mass felt.  No neck rigidity. Respiratory: No rhonchi or crepitations. Cardiovascular: S1-S2 heard. Abdomen: Soft nontender bowel sounds present. Musculoskeletal: No edema. Skin: No rash. Neurologic: Alert awake oriented to time place and person.  Moves all extremities. Psychiatric: Appears normal.  Normal affect.   Labs on Admission: I have personally reviewed following labs and imaging studies  CBC: No results for input(s): WBC, NEUTROABS, HGB, HCT, MCV, PLT in the  last 168 hours. Basic Metabolic Panel: No results for input(s): NA, K, CL, CO2, GLUCOSE, BUN, CREATININE, CALCIUM, MG, PHOS in the last 168 hours. GFR: CrCl cannot be calculated (No successful lab value found.). Liver Function Tests: No results for input(s): AST, ALT, ALKPHOS, BILITOT, PROT, ALBUMIN in the last 168 hours. No results for input(s): LIPASE, AMYLASE in the last 168 hours. No results for input(s): AMMONIA in the last 168 hours. Coagulation Profile: No results for input(s): INR, PROTIME in the last 168 hours. Cardiac Enzymes: No results for input(s): CKTOTAL, CKMB, CKMBINDEX, TROPONINI in the last 168 hours. BNP (last 3 results) No results for input(s): PROBNP in the last 8760 hours. HbA1C: No results for input(s): HGBA1C in the last 72 hours. CBG: Recent Labs  Lab 08/14/20 1225  GLUCAP 116*   Lipid Profile: No results for input(s): CHOL, HDL, LDLCALC, TRIG, CHOLHDL, LDLDIRECT in the last 72 hours. Thyroid Function Tests: No results for input(s): TSH, T4TOTAL, FREET4, T3FREE, THYROIDAB in the last 72 hours. Anemia Panel: No results for input(s): VITAMINB12, FOLATE, FERRITIN, TIBC, IRON, RETICCTPCT in the last 72 hours. Urine analysis: No results found for: COLORURINE, APPEARANCEUR, LABSPEC, PHURINE, GLUCOSEU, HGBUR, BILIRUBINUR, KETONESUR, PROTEINUR, UROBILINOGEN, NITRITE, LEUKOCYTESUR Sepsis Labs: @LABRCNTIP (procalcitonin:4,lacticidven:4) )No results found for this or any previous visit (from the past 240 hour(s)).   Radiological Exams on Admission: DG ERCP  Result Date: 08/14/2020 CLINICAL DATA:  Pancreatic mass and biliary obstruction. EXAM: ERCP TECHNIQUE: Multiple spot images obtained with the fluoroscopic device and submitted for interpretation post-procedure. COMPARISON:  CT of the abdomen on 07/27/2020 at [facility]: Imaging with a C-arm demonstrates attempted cannulation of the ampulla and common bile duct. Cholangiogram was not able to be  performed. IMPRESSION: Attempted cannulation of the ampulla and common bile duct. These images were submitted for radiologic interpretation only. Please see the procedural report for the amount of contrast and the fluoroscopy time utilized. Electronically Signed   By: Aletta Edouard M.D.   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  Stent placement for which IR has been consulted.  GI will be following. Hypertension continue amlodipine we will hold ARB as needed IV hydralazine. Diabetes mellitus type 2 we will keep patient on sliding scale coverage.  All labs are pending.  COVID test is pending.   DVT prophylaxis: SCDs.  In anticipation of procedure will avoid anticoagulation. Code Status: Full code. Family Communication: Patient's daughter. Disposition Plan: Home. Consults called: Interventional radiology and GI. Admission status: Observation.   Rise Patience MD [facility] Pager [phone].  If 7PM-7AM, please contact night-coverage [email or web address] Password [email or web address]  08/14/2020, 4:20 PM

## 2020-08-14 NOTE — Anesthesia Preprocedure Evaluation (Addendum)
Anesthesia Evaluation  Patient identified by MRN, date of birth, ID band Patient awake    Reviewed: Allergy & Precautions, NPO status , Patient's Chart, lab work & pertinent test results  History of Anesthesia Complications Negative for: history of anesthetic complications  Airway Mallampati: II  TM Distance: >3 FB Neck ROM: Full    Dental  (+) Edentulous Upper, Edentulous Lower   Pulmonary former smoker,    Pulmonary exam normal        Cardiovascular hypertension, Pt. on medications Normal cardiovascular exam     Neuro/Psych negative neurological ROS  negative psych ROS   GI/Hepatic Neg liver ROS, GERD  Medicated and Controlled,  Endo/Other  diabetes, Type 2, Oral Hypoglycemic Agents  Renal/GU negative Renal ROS     Musculoskeletal negative musculoskeletal ROS (+)   Abdominal   Peds  Hematology negative hematology ROS (+)   Anesthesia Other Findings   Reproductive/Obstetrics                            Anesthesia Physical Anesthesia Plan  ASA: 3  Anesthesia Plan: General   Post-op Pain Management:    Induction: Intravenous  PONV Risk Score and Plan: 2 and Treatment may vary due to age or medical condition, Ondansetron and Dexamethasone  Airway Management Planned: Oral ETT  Additional Equipment: None  Intra-op Plan:   Post-operative Plan: Extubation in OR  Informed Consent: I have reviewed the patients History and Physical, chart, labs and discussed the procedure including the risks, benefits and alternatives for the proposed anesthesia with the patient or authorized representative who has indicated his/her understanding and acceptance.     Dental advisory given  Plan Discussed with: CRNA and Anesthesiologist  Anesthesia Plan Comments:        Anesthesia Quick Evaluation

## 2020-08-14 NOTE — Anesthesia Procedure Notes (Signed)
Procedure Name: Intubation Date/Time: 08/14/2020 1:18 PM Performed by: Lavina Hamman, CRNA Pre-anesthesia Checklist: Patient identified, Emergency Drugs available, Suction available, Patient being monitored and Timeout performed Patient Re-evaluated:Patient Re-evaluated prior to induction Oxygen Delivery Method: Circle system utilized Preoxygenation: Pre-oxygenation with 100% oxygen Induction Type: IV induction Ventilation: Mask ventilation without difficulty Laryngoscope Size: Mac and 4 Grade View: Grade I Tube type: Oral Tube size: 7.0 mm Number of attempts: 1 Airway Equipment and Method: Stylet Placement Confirmation: ETT inserted through vocal cords under direct vision, positive ETCO2, CO2 detector and breath sounds checked- equal and bilateral Secured at: 22 cm Tube secured with: Tape Dental Injury: Teeth and Oropharynx as per pre-operative assessment  Comments: ATOI by EMS student, large epiglottis.

## 2020-08-14 NOTE — H&P (Signed)
GASTROENTEROLOGY PROCEDURE H&P NOTE   Primary Care Physician: Georganna Skeans, PA-C  HPI: Jeff Jordan is a 65 y.o. male who presents for EGD/EUS with ERCP attempt at biliary obstruction from pancreatic v duodenal mass.  Past Medical History:  Diagnosis Date   Biliary obstruction    Essential hypertension    Gastroesophageal reflux    Hepatic steatosis    Mild hyperlipidemia    Type 2 diabetes mellitus ([place])    Past Surgical History:  Procedure Laterality Date   CATARACT EXTRACTION Bilateral    NECK SURGERY     Current Facility-Administered Medications  Medication Dose Route Frequency Provider Last Rate Last Admin   0.9 %  sodium chloride infusion   Intravenous Continuous Mansouraty, Telford Nab., MD       lactated ringers infusion   Intravenous Continuous Mansouraty, Telford Nab., MD   New Bag at 08/14/20 1230   No Known Allergies History reviewed. No pertinent family history. Social History   Socioeconomic History   Marital status: Divorced    Spouse name: Not on file   Number of children: 1   Years of education: Not on file   Highest education level: Not on file  Occupational History   Not on file  Tobacco Use   Smoking status: Former    Packs/day: 1.00    Years: 12.00    Pack years: 12.00    Types: Cigarettes    Quit date: 01/28/2000    Years since quitting: 20.5   Smokeless tobacco: Never  Vaping Use   Vaping Use: Never used  Substance and Sexual Activity   Alcohol use: Not Currently    Comment: quit 2002 usednto drink alot   Drug use: Not Currently   Sexual activity: Yes  Other Topics Concern   Not on file  Social History Narrative   Not on file   Social Determinants of Health   Financial Resource Strain: Not on file  Food Insecurity: Not on file  Transportation Needs: Not on file  Physical Activity: Not on file  Stress: Not on file  Social Connections: Not on file  Intimate Partner Violence: Not on file    Physical Exam: Vital signs in  last 24 hours: Temp:  [97.8 F (36.6 C)] 97.8 F (36.6 C) (07/19 1203) Pulse Rate:  [95] 95 (07/19 1203) Resp:  [18] 18 (07/19 1203) BP: (164)/(53) 164/53 (07/19 1203) SpO2:  [100 %] 100 % (07/19 1203) Weight:  [68 kg] 68 kg (07/19 1203)   GEN: NAD EYE: Sclerae anicteric ENT: MMM CV: Non-tachycardic GI: Soft, TTP throughout abdomen 7-8/10 pain currently NEURO:  Alert & Oriented x 3  Lab Results: No results for input(s): WBC, HGB, HCT, PLT in the last 72 hours. BMET No results for input(s): NA, K, CL, CO2, GLUCOSE, BUN, CREATININE, CALCIUM in the last 72 hours. LFT No results for input(s): PROT, ALBUMIN, AST, ALT, ALKPHOS, BILITOT, BILIDIR, IBILI in the last 72 hours. PT/INR No results for input(s): LABPROT, INR in the last 72 hours.   Impression / Plan: This is a 65 y.o.malewho presents for EGD/EUS with ERCP attempt at biliary obstruction from pancreatic v duodenal mass.  The risks of an EUS including intestinal perforation, bleeding, infection, aspiration, and medication effects were discussed as was the possibility it may not give a definitive diagnosis if a biopsy is performed.  When a biopsy of the pancreas is done as part of the EUS, there is an additional risk of pancreatitis at the rate of about  1-2%.  It was explained that procedure related pancreatitis is typically mild, although it can be severe and even life threatening, which is why we do not perform random pancreatic biopsies and only biopsy a lesion/area we feel is concerning enough to warrant the risk.  The risks of an ERCP were discussed at length, including but not limited to the risk of perforation, bleeding, abdominal pain, post-ERCP pancreatitis (while usually mild can be severe and even life threatening).   The risks and benefits of endoscopic evaluation were discussed with the patient; these include but are not limited to the risk of perforation, infection, bleeding, missed lesions, lack of diagnosis, severe  illness requiring hospitalization, as well as anesthesia and sedation related illnesses.  The patient is agreeable to proceed.    Justice Britain, MD [facility] Advanced [facility]oscopy Office # [phone]

## 2020-08-14 NOTE — Transfer of Care (Signed)
Immediate Anesthesia Transfer of Care Note  Patient: Jeff Jordan  Procedure(s) Performed: ENDOSCOPIC RETROGRADE CHOLANGIOPANCREATOGRAPHY (ERCP) WITH PROPOFOL BIOPSY FINE NEEDLE ASPIRATION (FNA) LINEAR UPPER ENDOSCOPIC ULTRASOUND (EUS) LINEAR ESOPHAGOGASTRODUODENOSCOPY (EGD)  Patient Location: PACU  Anesthesia Type:General  Level of Consciousness: sedated  Airway & Oxygen Therapy: Patient Spontanous Breathing and Patient connected to face mask oxygen  Post-op Assessment: Report given to RN and Post -op Vital signs reviewed and stable  Post vital signs: Reviewed and stable  Last Vitals:  Vitals Value Taken Time  BP    Temp    Pulse    Resp    SpO2      Last Pain:  Vitals:   08/14/20 1203  TempSrc: Oral  PainSc: 7          Complications: No notable events documented.

## 2020-08-14 NOTE — Anesthesia Postprocedure Evaluation (Signed)
Anesthesia Post Note  Patient: Jeff Jordan  Procedure(s) Performed: ENDOSCOPIC RETROGRADE CHOLANGIOPANCREATOGRAPHY (ERCP) WITH PROPOFOL BIOPSY FINE NEEDLE ASPIRATION (FNA) LINEAR UPPER ENDOSCOPIC ULTRASOUND (EUS) LINEAR ESOPHAGOGASTRODUODENOSCOPY (EGD)     Patient location during evaluation: PACU Anesthesia Type: General Level of consciousness: awake and alert Pain management: pain level controlled Vital Signs Assessment: post-procedure vital signs reviewed and stable Respiratory status: spontaneous breathing, nonlabored ventilation and respiratory function stable Cardiovascular status: stable and blood pressure returned to baseline Anesthetic complications: no   No notable events documented.  Last Vitals:  Vitals:   08/14/20 1510 08/14/20 1536  BP: (!) 151/58 (!) 147/65  Pulse: 89 83  Resp: 15 16  Temp:  36.4 C  SpO2: 99% 100%    Last Pain:  Vitals:   08/14/20 1536  TempSrc: Oral  PainSc:                  Audry Pili

## 2020-08-14 NOTE — Op Note (Signed)
[facility] Patient Name: Jeff Jordan Procedure Date: 08/14/2020 MRN: [id number] Attending MD: Justice Britain , MD Date of Birth: Dec 02, 1955 CSN: [id number] Age: 65 Admit Type: Outpatient Procedure:                ERCP Indications:              Malignant stricture of the common bile duct,                            Abnormal abdominal CT, Abnormal MRCP, Jaundice,                            Abnormal liver function test, Tumor of the head of                            pancreas Providers:                Justice Britain, MD, Elmer Ramp. Tilden Dome, RN,                            Laverda Sorenson, Technician, [employer], CRNA Referring MD:              Medicines:                General Anesthesia, Cipro 400 mg IV, Glucagon 0.25                            mg IV, Indomethacin 793 mg PR Complications:            No immediate complications. Estimated Blood Loss:     Estimated blood loss was minimal. Procedure:                Pre-Anesthesia Assessment:                           - Prior to the procedure, a History and Physical                            was performed, and patient medications and                            allergies were reviewed. The patient's tolerance of                            previous anesthesia was also reviewed. The risks                            and benefits of the procedure and the sedation                            options and risks were discussed with the patient.                            All questions were answered, and informed consent  was obtained. Prior Anticoagulants: The patient has                            taken no previous anticoagulant or antiplatelet                            agents. ASA Grade Assessment: III - A patient with                            severe systemic disease. After reviewing the risks                            and benefits, the patient was deemed in                             satisfactory condition to undergo the procedure.                           After obtaining informed consent, the scope was                            passed under direct vision. Throughout the                            procedure, the patient's blood pressure, pulse, and                            oxygen saturations were monitored continuously. The                            TJF-Q180V ([id number]) Olympus Duodenoscope was                            introduced through the mouth, and used to inject                            contrast into without successful cannulation. The                            ERCP was extremely difficult due to challenging                            cannulation because of inability to visualize the                            major papilla and duodenal stenosis. The patient                            tolerated the procedure. Scope In: Scope Out: Findings:      The scout film was normal.      The upper GI tract had been evaluated during EUS. As previously noted in       the EUS report, an acquired malignant-appearing, intrinsic severe       stenosis was found in  the D1/D2 angle but was non-traversable with the       Duodenoscope. I placed a wire into the duodenum and used Fluroscopy in       an attempt to get deeper into the D2 portion but this was not possible.      In an area of possible proximal ampullary tissue, I attempted to       cannulate using a long-positioned sphincterotome and bend the wire       upwards towards the biliary tree fluroroscopically. This was not       successful.      Unfortunately, endoscopic placement of a biliary stent is not possible       at this junction within typical ERCP capabilities.      The duodenoscope was withdrawn from the patient. 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                           - The major papilla was not found  because of                            duodenal obstruction. Moderate Sedation:      Not Applicable - Patient had care per Anesthesia. Recommendation:           - The patient will be observed post-procedure,                            until all discharge criteria are met.                           - Admit the patient to hospital ward for ongoing                            care.                           - Observe patient's clinical course.                           - Refer to an interventional radiologist (order in                            place) for attempt at percutaneous biliary drainage                            catheter placement to aid in biliary obstruction.                           - Biliary brushings would be ideal as well.                           - Watch for pancreatitis, bleeding, perforation,  and cholangitis.                           - Depending on how patient does in the coming                            days/weeks, regarding his nutritional status, he                            may require consideration of TPN +/- enteral                            stenting. Recommend patient undergo Nutrition                            consultation and optimization with protein shakes                            as able.                           - The findings and recommendations were discussed                            with the patient.                           - The findings and recommendations were discussed                            with the patient's family.                           - The findings and recommendations were discussed                            with the [facility] team. Procedure Code(s):        --- Professional ---                           [phone], Esophagogastroduodenoscopy, flexible,                            transoral; diagnostic, including collection of                            specimen(s) by brushing or washing, when  performed                            (separate procedure) Diagnosis Code(s):        --- Professional ---                           K31.5, Obstruction of duodenum                           K83.1, Obstruction of bile duct  R17, Unspecified jaundice                           R94.5, Abnormal results of liver function studies                           D49.0, Neoplasm of unspecified behavior of                            digestive system                           R93.5, Abnormal findings on diagnostic imaging of                            other abdominal regions, including retroperitoneum                           R93.2, Abnormal findings on diagnostic imaging of                            liver and biliary tract CPT copyright 2019 [employer]. All rights reserved. The codes documented in this report are preliminary and upon coder review may  be revised to meet current compliance requirements. Justice Britain, MD 08/14/2020 9:29:47 PM Number of Addenda: 0

## 2020-08-15 ENCOUNTER — Encounter: Payer: Self-pay | Admitting: Gastroenterology

## 2020-08-15 ENCOUNTER — Other Ambulatory Visit: Payer: Self-pay | Admitting: Oncology

## 2020-08-15 ENCOUNTER — Encounter (HOSPITAL_COMMUNITY): Payer: Self-pay | Admitting: Internal Medicine

## 2020-08-15 DIAGNOSIS — E119 Type 2 diabetes mellitus without complications: Secondary | ICD-10-CM | POA: Diagnosis not present

## 2020-08-15 DIAGNOSIS — D638 Anemia in other chronic diseases classified elsewhere: Secondary | ICD-10-CM | POA: Diagnosis present

## 2020-08-15 DIAGNOSIS — I10 Essential (primary) hypertension: Secondary | ICD-10-CM | POA: Diagnosis present

## 2020-08-15 DIAGNOSIS — R112 Nausea with vomiting, unspecified: Secondary | ICD-10-CM | POA: Diagnosis not present

## 2020-08-15 DIAGNOSIS — K8689 Other specified diseases of pancreas: Secondary | ICD-10-CM | POA: Diagnosis not present

## 2020-08-15 DIAGNOSIS — E870 Hyperosmolality and hypernatremia: Secondary | ICD-10-CM | POA: Diagnosis not present

## 2020-08-15 DIAGNOSIS — B370 Candidal stomatitis: Secondary | ICD-10-CM | POA: Diagnosis not present

## 2020-08-15 DIAGNOSIS — R509 Fever, unspecified: Secondary | ICD-10-CM | POA: Diagnosis not present

## 2020-08-15 DIAGNOSIS — E8770 Fluid overload, unspecified: Secondary | ICD-10-CM | POA: Diagnosis present

## 2020-08-15 DIAGNOSIS — K831 Obstruction of bile duct: Secondary | ICD-10-CM | POA: Diagnosis present

## 2020-08-15 DIAGNOSIS — D62 Acute posthemorrhagic anemia: Secondary | ICD-10-CM | POA: Diagnosis not present

## 2020-08-15 DIAGNOSIS — K311 Adult hypertrophic pyloric stenosis: Secondary | ICD-10-CM | POA: Diagnosis present

## 2020-08-15 DIAGNOSIS — K2971 Gastritis, unspecified, with bleeding: Secondary | ICD-10-CM | POA: Diagnosis present

## 2020-08-15 DIAGNOSIS — K920 Hematemesis: Secondary | ICD-10-CM | POA: Diagnosis not present

## 2020-08-15 DIAGNOSIS — C259 Malignant neoplasm of pancreas, unspecified: Secondary | ICD-10-CM | POA: Diagnosis not present

## 2020-08-15 DIAGNOSIS — K56600 Partial intestinal obstruction, unspecified as to cause: Secondary | ICD-10-CM | POA: Diagnosis not present

## 2020-08-15 DIAGNOSIS — R627 Adult failure to thrive: Secondary | ICD-10-CM | POA: Diagnosis present

## 2020-08-15 DIAGNOSIS — Z87891 Personal history of nicotine dependence: Secondary | ICD-10-CM | POA: Diagnosis not present

## 2020-08-15 DIAGNOSIS — K315 Obstruction of duodenum: Secondary | ICD-10-CM | POA: Diagnosis present

## 2020-08-15 DIAGNOSIS — C250 Malignant neoplasm of head of pancreas: Secondary | ICD-10-CM | POA: Diagnosis present

## 2020-08-15 DIAGNOSIS — E871 Hypo-osmolality and hyponatremia: Secondary | ICD-10-CM | POA: Diagnosis present

## 2020-08-15 DIAGNOSIS — E11649 Type 2 diabetes mellitus with hypoglycemia without coma: Secondary | ICD-10-CM | POA: Diagnosis not present

## 2020-08-15 DIAGNOSIS — A4189 Other specified sepsis: Secondary | ICD-10-CM | POA: Diagnosis not present

## 2020-08-15 DIAGNOSIS — Z515 Encounter for palliative care: Secondary | ICD-10-CM | POA: Diagnosis not present

## 2020-08-15 DIAGNOSIS — K219 Gastro-esophageal reflux disease without esophagitis: Secondary | ICD-10-CM | POA: Diagnosis present

## 2020-08-15 DIAGNOSIS — Z20822 Contact with and (suspected) exposure to covid-19: Secondary | ICD-10-CM | POA: Diagnosis present

## 2020-08-15 DIAGNOSIS — E785 Hyperlipidemia, unspecified: Secondary | ICD-10-CM | POA: Diagnosis present

## 2020-08-15 DIAGNOSIS — D509 Iron deficiency anemia, unspecified: Secondary | ICD-10-CM | POA: Diagnosis not present

## 2020-08-15 DIAGNOSIS — E1165 Type 2 diabetes mellitus with hyperglycemia: Secondary | ICD-10-CM | POA: Diagnosis present

## 2020-08-15 DIAGNOSIS — Z7189 Other specified counseling: Secondary | ICD-10-CM | POA: Diagnosis not present

## 2020-08-15 DIAGNOSIS — K760 Fatty (change of) liver, not elsewhere classified: Secondary | ICD-10-CM | POA: Diagnosis present

## 2020-08-15 DIAGNOSIS — K8309 Other cholangitis: Secondary | ICD-10-CM | POA: Diagnosis present

## 2020-08-15 DIAGNOSIS — E872 Acidosis: Secondary | ICD-10-CM | POA: Diagnosis not present

## 2020-08-15 DIAGNOSIS — Z0189 Encounter for other specified special examinations: Secondary | ICD-10-CM | POA: Diagnosis not present

## 2020-08-15 DIAGNOSIS — E440 Moderate protein-calorie malnutrition: Secondary | ICD-10-CM | POA: Diagnosis present

## 2020-08-15 LAB — BILIRUBIN, DIRECT: Bilirubin, Direct: 11.4 mg/dL — ABNORMAL HIGH (ref 0.0–0.2)

## 2020-08-15 LAB — CBC
HCT: 36 % — ABNORMAL LOW (ref 39.0–52.0)
Hemoglobin: 12.7 g/dL — ABNORMAL LOW (ref 13.0–17.0)
MCH: 28.1 pg (ref 26.0–34.0)
MCHC: 35.3 g/dL (ref 30.0–36.0)
MCV: 79.6 fL — ABNORMAL LOW (ref 80.0–100.0)
Platelets: 274 10*3/uL (ref 150–400)
RBC: 4.52 MIL/uL (ref 4.22–5.81)
RDW: 24.8 % — ABNORMAL HIGH (ref 11.5–15.5)
WBC: 10 10*3/uL (ref 4.0–10.5)
nRBC: 0 % (ref 0.0–0.2)

## 2020-08-15 LAB — SARS CORONAVIRUS 2 (TAT 6-24 HRS): SARS Coronavirus 2: NEGATIVE

## 2020-08-15 LAB — GLUCOSE, CAPILLARY
Glucose-Capillary: 135 mg/dL — ABNORMAL HIGH (ref 70–99)
Glucose-Capillary: 140 mg/dL — ABNORMAL HIGH (ref 70–99)
Glucose-Capillary: 148 mg/dL — ABNORMAL HIGH (ref 70–99)
Glucose-Capillary: 121 mg/dL — ABNORMAL HIGH (ref 70–99)

## 2020-08-15 LAB — COMPREHENSIVE METABOLIC PANEL
ALT: 130 U/L — ABNORMAL HIGH (ref 0–44)
AST: 187 U/L — ABNORMAL HIGH (ref 15–41)
Albumin: 3 g/dL — ABNORMAL LOW (ref 3.5–5.0)
Alkaline Phosphatase: 171 U/L — ABNORMAL HIGH (ref 38–126)
Anion gap: 19 — ABNORMAL HIGH (ref 5–15)
BUN: 25 mg/dL — ABNORMAL HIGH (ref 8–23)
CO2: 25 mmol/L (ref 22–32)
Calcium: 9.8 mg/dL (ref 8.9–10.3)
Chloride: 100 mmol/L (ref 98–111)
Creatinine, Ser: 0.95 mg/dL (ref 0.61–1.24)
GFR, Estimated: >60 mL/min (ref >60)
Glucose, Bld: 127 mg/dL — ABNORMAL HIGH (ref 70–99)
Potassium: 4.6 mmol/L (ref 3.5–5.1)
Sodium: 144 mmol/L (ref 135–145)
Total Bilirubin: 19.1 mg/dL (ref 0.3–1.2)
Total Protein: 7.2 g/dL (ref 6.5–8.1)

## 2020-08-15 LAB — SURGICAL PATHOLOGY

## 2020-08-15 LAB — PROTIME-INR
INR: 1.5 — ABNORMAL HIGH (ref 0.8–1.2)
Prothrombin Time: 18.4 seconds — ABNORMAL HIGH (ref 11.4–15.2)

## 2020-08-15 LAB — CYTOLOGY - NON PAP

## 2020-08-15 LAB — HIV ANTIBODY (ROUTINE TESTING W REFLEX): HIV Screen 4th Generation wRfx: NONREACTIVE

## 2020-08-15 MED ORDER — ONDANSETRON HCL 4 MG/2ML IJ SOLN
4.0000 mg | Freq: Four times a day (QID) | INTRAMUSCULAR | Status: DC | PRN
  Administered 2020-08-17 – 2020-08-19 (×4): 4 mg via INTRAVENOUS
  Filled 2020-08-15 (×4): qty 2

## 2020-08-15 MED ORDER — VITAMIN K1 10 MG/ML IJ SOLN
10.0000 mg | Freq: Once | INTRAVENOUS | Status: AC
  Administered 2020-08-15: 10 mg via INTRAVENOUS
  Filled 2020-08-15: qty 1

## 2020-08-15 MED ORDER — MORPHINE SULFATE (PF) 2 MG/ML IV SOLN
2.0000 mg | INTRAVENOUS | Status: DC | PRN
  Administered 2020-08-15 – 2020-08-20 (×10): 2 mg via INTRAVENOUS
  Filled 2020-08-15 (×11): qty 1

## 2020-08-15 NOTE — Progress Notes (Signed)
PROGRESS NOTE  Jeff Jordan ASN:[id number] DOB: Jul 04, 1955 DOA: 08/14/2020 PCP: Georganna Skeans, PA-C  HPI/Recap of past 46 hours: 65 year old male with past medical history of hypertension and diabetes mellitus with recent diagnosis of pancreatic mass suspected to be pancreatic adenocarcinoma referred to Dr. Allison Quarry of gastroenterology for stent placement for obstruction and underwent ERCP on 7/19 with stent unable to be placed.  Patient was then directly admitted to the hospital service for interventional radiology consultation and stent intervention.    Seen by interventional radiology with plans for stent placement on 7/21.  In the interim, biopsy results positive for pancreatic adenocarcinoma.  Patient continues to complain of midepigastric pain and some nausea.  Assessment/Plan: Principal Problem:   Pancreatic adenocarcinoma causing biliary obstruction: Unable to place ERCP.  Interventional radiology for stent placement on 7/21.  Nutrition consulted.  Referral made for oncology as outpatient Active Problems:   Diabetes mellitus type 2 in nonobese [facility]): Patient NPO.  On sliding scale.    Essential hypertension: On IV antihypertensives.   Code Status: Full code  Family Communication: Left message for family  Disposition Plan: Discharge in next 1 to 2 days with stent placed   Consultants: Interventional radiology Gastroenterology  Procedures: Outpatient ERCP attempted 7/19-unsuccessful Interventional radiology biliary drain placement potential  Antimicrobials: IV cefoxitin x1 dose preop  DVT prophylaxis: SCDs  Level of care: Med-Surg   Objective: Vitals:   08/14/20 2333 08/15/20 0335  BP: (!) 149/60 (!) 134/56  Pulse: 97 96  Resp: 16 16  Temp: 98.7 F (37.1 C) 98.1 F (36.7 C)  SpO2: 100% 99%    Intake/Output Summary (Last 24 hours) at 08/15/2020 1017 Last data filed at 08/15/2020 0600 Gross per 24 hour  Intake 1100 ml  Output 50 ml  Net 1050 ml    Filed Weights   08/08/20 1138 08/14/20 1203  Weight: 72.6 kg 68 kg   Body mass index is 21.52 kg/m.  Exam:  General: Alert and oriented x3, mild distress secondary to pain HEENT: Normocephalic, atraumatic, sclera anicteric Cardiovascular: Regular rate and rhythm, S1-S2 Respiratory: Clear to auscultation bilaterally Abdomen: Soft, midepigastric abdominal pain with radiation to back, hypoactive bowel sounds Musculoskeletal: No clubbing or cyanosis or edema Skin: Jaundiced skin Psychiatry: Appropriate, no evidence of psychoses Neurology: No focal deficits   Data Reviewed: CBC: Recent Labs  Lab 08/14/20 1934 08/15/20 0452  WBC 5.7 10.0  NEUTROABS 5.0  --   HGB 13.1 12.7*  HCT 37.6* 36.0*  MCV 80.9 79.6*  PLT 264 193   Basic Metabolic Panel: Recent Labs  Lab 08/14/20 1934 08/15/20 0452  NA 142 144  K 4.9 4.6  CL 99 100  CO2 26 25  GLUCOSE 160* 127*  BUN 26* 25*  CREATININE 1.15 0.95  CALCIUM 9.6 9.8   GFR: Estimated Creatinine Clearance: 73.6 mL/min (by C-G formula based on SCr of 0.95 mg/dL). Liver Function Tests: Recent Labs  Lab 08/14/20 1934 08/15/20 0452  AST 163* 187*  ALT 115* 130*  ALKPHOS 166* 171*  BILITOT 18.0* 19.1*  PROT 6.9 7.2  ALBUMIN 2.9* 3.0*   No results for input(s): LIPASE, AMYLASE in the last 168 hours. No results for input(s): AMMONIA in the last 168 hours. Coagulation Profile: Recent Labs  Lab 08/14/20 1934 08/15/20 0452  INR 1.7* 1.5*   Cardiac Enzymes: No results for input(s): CKTOTAL, CKMB, CKMBINDEX, TROPONINI in the last 168 hours. BNP (last 3 results) No results for input(s): PROBNP in the last 8760 hours. HbA1C: No results  for input(s): HGBA1C in the last 72 hours. CBG: Recent Labs  Lab 08/14/20 1225 08/14/20 2102 08/15/20 0733  GLUCAP 116* 171* 135*   Lipid Profile: No results for input(s): CHOL, HDL, LDLCALC, TRIG, CHOLHDL, LDLDIRECT in the last 72 hours. Thyroid Function Tests: No results for  input(s): TSH, T4TOTAL, FREET4, T3FREE, THYROIDAB in the last 72 hours. Anemia Panel: No results for input(s): VITAMINB12, FOLATE, FERRITIN, TIBC, IRON, RETICCTPCT in the last 72 hours. Urine analysis: No results found for: COLORURINE, APPEARANCEUR, LABSPEC, PHURINE, GLUCOSEU, HGBUR, BILIRUBINUR, KETONESUR, PROTEINUR, UROBILINOGEN, NITRITE, LEUKOCYTESUR Sepsis Labs: @LABRCNTIP (procalcitonin:4,lacticidven:4)  ) Recent Results (from the past 240 hour(s))  SARS CORONAVIRUS 2 (TAT 6-24 HRS) Nasopharyngeal Nasopharyngeal Swab     Status: None   Collection Time: 08/14/20  6:04 PM   Specimen: Nasopharyngeal Swab  Result Value Ref Range Status   SARS Coronavirus 2 NEGATIVE NEGATIVE Final    Comment: (NOTE) SARS-CoV-2 target nucleic acids are NOT DETECTED.  The SARS-CoV-2 RNA is generally detectable in upper and lower respiratory specimens during the acute phase of infection. Negative results do not preclude SARS-CoV-2 infection, do not rule out co-infections with other pathogens, and should not be used as the sole basis for treatment or other patient management decisions. Negative results must be combined with clinical observations, patient history, and epidemiological information. The expected result is Negative.  Fact Sheet for Patients: SugarRoll.be  Fact Sheet for Healthcare Providers: https://www.woods-mathews.com/  This test is not yet approved or cleared by the [place] FDA and  has been authorized for detection and/or diagnosis of SARS-CoV-2 by FDA under an Emergency Use Authorization (EUA). This EUA will remain  in effect (meaning this test can be used) for the duration of the COVID-19 declaration under Se ction 564(b)(1) of the Act, 21 U.S.C. section 360bbb-3(b)(1), unless the authorization is terminated or revoked sooner.  Performed at [facility], [place] [address], [place], [place] [address]       Studies: DG  ERCP  Result Date: 08/14/2020 CLINICAL DATA:  Pancreatic mass and biliary obstruction. EXAM: ERCP TECHNIQUE: Multiple spot images obtained with the fluoroscopic device and submitted for interpretation post-procedure. COMPARISON:  CT of the abdomen on 07/27/2020 at [place]: Imaging with a C-arm demonstrates attempted cannulation of the ampulla and common bile duct. Cholangiogram was not able to be performed. IMPRESSION: Attempted cannulation of the ampulla and common bile duct. These images were submitted for radiologic interpretation only. Please see the procedural report for the amount of contrast and the fluoroscopy time utilized. Electronically Signed   By: Aletta Edouard M.D.   On: 08/14/2020 15:00    Scheduled Meds:  amLODipine  5 mg Oral Daily   ferrous sulfate  325 mg Oral Q breakfast   insulin aspart  0-6 Units Subcutaneous TID WC   pantoprazole  40 mg Oral Daily   rosuvastatin  5 mg Oral Weekly    Continuous Infusions:  [START ON 08/16/2020] cefOXitin     phytonadione (VITAMIN K) IV       LOS: 0 days     Annita Brod, MD [facility]   08/15/2020, 10:17 AM

## 2020-08-15 NOTE — Plan of Care (Signed)
Patient is alert and oriented x4. Up to the bathroom with walker and stand-by assist. Patient medicated for pain with oxycodone. NPO per order. Problem: Education: Goal: Knowledge of General Education information will improve Description: Including pain rating scale, medication(s)/side effects and non-pharmacologic comfort measures Outcome: Progressing   Problem: Health Behavior/Discharge Planning: Goal: Ability to manage health-related needs will improve Outcome: Progressing   Problem: Clinical Measurements: Goal: Ability to maintain clinical measurements within normal limits will improve Outcome: Progressing Goal: Will remain free from infection Outcome: Progressing Goal: Diagnostic test results will improve Outcome: Progressing Goal: Respiratory complications will improve Outcome: Progressing Goal: Cardiovascular complication will be avoided Outcome: Progressing   Problem: Activity: Goal: Risk for activity intolerance will decrease Outcome: Progressing   Problem: Nutrition: Goal: Adequate nutrition will be maintained Outcome: Progressing   Problem: Coping: Goal: Level of anxiety will decrease Outcome: Progressing   Problem: Elimination: Goal: Will not experience complications related to bowel motility Outcome: Progressing Goal: Will not experience complications related to urinary retention Outcome: Progressing   Problem: Pain Managment: Goal: General experience of comfort will improve Outcome: Progressing   Problem: Safety: Goal: Ability to remain free from injury will improve Outcome: Progressing   Problem: Skin Integrity: Goal: Risk for impaired skin integrity will decrease Outcome: Progressing

## 2020-08-15 NOTE — H&P (Addendum)
Chief Complaint: Patient was seen in consultation today for image guided biliary drain placement at the request of Dr. Hal Hope, A  Referring Physician(s):  Dr. Hal Hope, A  Supervising Physician: Ruthann Cancer  Patient Status: [facility] - In-pt  History of Present Illness: Jeff Jordan is a 65 y.o. male with past medical history of HTN, hyperlipidemia, DM type II, hepatic steatosis, recent diagnosis of pancreatic mass with biliary obstruction.  Per note from oncology, patient underwent CT imaging recently which showed a pancreatic mass.  Patient also underwent ultrasound which revealed intrahepatic and extrahepatic biliary dilation including a distended gallbladder.  Patient has subsequent MRCP on 07/23/2020 which showed diffuse intra and extrahepatic biliary ductal dilation due to distal common bile duct stricture in the region of the pancreatic head, the pancreatic mass is suspicious for pancreatic carcinoma.  Patient had a consultation with hematology/oncology on 08/02/2020, when a urgent biopsy and biliary stent placement was recommended to the patient.  Patient was referred to GI and underwent ERCP with stent placement on 08/13/20, the  procedure was unsuccessful.   IR was requested for image guided biliary drain placement. Case was reviewed and approved by Dr. Serafina Royals.  Patient laying in bed, not in acute distress.  RN at the bedside. Reports mild tenderness on right upper quadrant.  Denise headache, fever, chills, shortness of breath, cough, chest pain,  nausea ,vomiting, and bleeding.  Past Medical History:  Diagnosis Date   Biliary obstruction    Essential hypertension    Gastroesophageal reflux    Hepatic steatosis    Mild hyperlipidemia    Type 2 diabetes mellitus [facility])     Past Surgical History:  Procedure Laterality Date   BIOPSY  08/14/2020   Procedure: BIOPSY;  Surgeon: Rush Landmark Telford Nab., MD;  Location: [facility];  Service: Gastroenterology;;   CATARACT  EXTRACTION Bilateral    ENDOSCOPIC RETROGRADE CHOLANGIOPANCREATOGRAPHY (ERCP) WITH PROPOFOL N/A 08/14/2020   Procedure: ENDOSCOPIC RETROGRADE CHOLANGIOPANCREATOGRAPHY (ERCP) WITH PROPOFOL;  Surgeon: Irving Copas., MD;  Location: [facility];  Service: Gastroenterology;  Laterality: N/A;  aborted ercp   ESOPHAGOGASTRODUODENOSCOPY N/A 08/14/2020   Procedure: ESOPHAGOGASTRODUODENOSCOPY (EGD);  Surgeon: Irving Copas., MD;  Location: [facility];  Service: Gastroenterology;  Laterality: N/A;   EUS  08/14/2020   Procedure: UPPER ENDOSCOPIC ULTRASOUND (EUS) LINEAR;  Surgeon: Irving Copas., MD;  Location: [facility];  Service: Gastroenterology;;   FINE NEEDLE ASPIRATION  08/14/2020   Procedure: FINE NEEDLE ASPIRATION (FNA) LINEAR;  Surgeon: Irving Copas., MD;  Location: [facility];  Service: Gastroenterology;;   NECK SURGERY      Allergies: Patient has no known allergies.  Medications: Prior to Admission medications   Medication Sig Start Date End Date Taking? Authorizing Provider  amLODipine (NORVASC) 5 MG tablet Take 5 mg by mouth daily. 06/19/20  Yes [provider]  aspirin 81 MG EC tablet Take 81 mg by mouth daily.   Yes [provider]  ferrous sulfate 325 (65 FE) MG tablet Take 325 mg by mouth daily with breakfast.   Yes [provider]  JARDIANCE 25 MG TABS tablet Take 25 mg by mouth daily. 07/23/20  Yes [provider]  loperamide (IMODIUM) 2 MG capsule Take by mouth as needed for diarrhea or loose stools.   Yes [provider]  metFORMIN (GLUCOPHAGE) 1000 MG tablet Take 1,000 mg by mouth 2 (two) times daily. 07/16/20  Yes [provider]  olmesartan (BENICAR) 40 MG tablet Take 40 mg by mouth daily. 06/25/20  Yes [provider]  omeprazole (PRILOSEC) 40 MG capsule Take 40 mg by mouth every morning. 03/01/20  Yes [provider]  ondansetron (ZOFRAN) 4 MG tablet Take 1 tablet (4 mg  total) by mouth every 4 (four) hours as needed for nausea. 08/02/20  Yes Dayton Scrape A, NP  Oxycodone HCl 10 MG TABS Take 1 tablet (10 mg total) by mouth every 4 (four) hours as needed. Patient taking differently: Take 10 mg by mouth every 4 (four) hours as needed (pain). 08/02/20  Yes Dayton Scrape A, NP  pioglitazone (ACTOS) 45 MG tablet Take 45 mg by mouth daily. 06/25/20  Yes [provider]  rosuvastatin (CRESTOR) 5 MG tablet Take 5 mg by mouth once a week. 06/25/20  Yes [provider]     Family History  Family history unknown: Yes    Social History   Socioeconomic History   Marital status: Divorced    Spouse name: Not on file   Number of children: 1   Years of education: Not on file   Highest education level: Not on file  Occupational History   Not on file  Tobacco Use   Smoking status: Former    Packs/day: 1.00    Years: 12.00    Pack years: 12.00    Types: Cigarettes    Quit date: 01/28/2000    Years since quitting: 20.5   Smokeless tobacco: Never  Vaping Use   Vaping Use: Never used  Substance and Sexual Activity   Alcohol use: Not Currently    Comment: quit 2002 usednto drink alot   Drug use: Not Currently   Sexual activity: Yes  Other Topics Concern   Not on file  Social History Narrative   Not on file   Social Determinants of Health   Financial Resource Strain: Not on file  Food Insecurity: Not on file  Transportation Needs: Not on file  Physical Activity: Not on file  Stress: Not on file  Social Connections: Not on file     Review of Systems: A 12 point ROS discussed and pertinent positives are indicated in the HPI above.  All other systems are negative.   Vital Signs: BP (!) 140/54 (BP Location: Right Arm)   Pulse 99   Temp 97.9 F (36.6 C) (Oral)   Resp 16   Ht 5\' 10"  (1.778 m)   Wt 150 lb (68 kg)   SpO2 100%   BMI 21.52 kg/m   Physical Exam Vitals reviewed.  Constitutional:      Appearance: Normal appearance.   HENT:     Head: Normocephalic and atraumatic.     Mouth/Throat:     Mouth: Mucous membranes are moist.  Cardiovascular:     Rate and Rhythm: Normal rate and regular rhythm.     Pulses: Normal pulses.     Heart sounds: Normal heart sounds.  Pulmonary:     Effort: Pulmonary effort is normal.     Breath sounds: Normal breath sounds.  Abdominal:     General: Abdomen is flat. Bowel sounds are normal.     Palpations: Abdomen is soft.  Musculoskeletal:     Cervical back: Neck supple.  Skin:    General: Skin is warm.     Coloration: Skin is jaundiced. Skin is not pale.     Comments: Scleral icterus  Neurological:     Mental Status: He is alert and oriented to person, place, and time.  Psychiatric:        Mood  and Affect: Mood normal.        Behavior: Behavior normal.        Judgment: Judgment normal.    Imaging: DG ERCP  Result Date: 08/14/2020 CLINICAL DATA:  Pancreatic mass and biliary obstruction. EXAM: ERCP TECHNIQUE: Multiple spot images obtained with the fluoroscopic device and submitted for interpretation post-procedure. COMPARISON:  CT of the abdomen on 07/27/2020 at [place]: Imaging with a C-arm demonstrates attempted cannulation of the ampulla and common bile duct. Cholangiogram was not able to be performed. IMPRESSION: Attempted cannulation of the ampulla and common bile duct. These images were submitted for radiologic interpretation only. Please see the procedural report for the amount of contrast and the fluoroscopy time utilized. Electronically Signed   By: Aletta Edouard M.D.   On: 08/14/2020 15:00    Labs:  CBC: Recent Labs    08/14/20 1934 08/15/20 0452  WBC 5.7 10.0  HGB 13.1 12.7*  HCT 37.6* 36.0*  PLT 264 274    COAGS: Recent Labs    08/14/20 1934 08/15/20 0452  INR 1.7* 1.5*    BMP: Recent Labs    08/14/20 1934 08/15/20 0452  NA 142 144  K 4.9 4.6  CL 99 100  CO2 26 25  GLUCOSE 160* 127*  BUN 26* 25*  CALCIUM 9.6 9.8   CREATININE 1.15 0.95  GFRNONAA >60 >60    LIVER FUNCTION TESTS: Recent Labs    08/14/20 1934 08/15/20 0452  BILITOT 18.0* 19.1*  AST 163* 187*  ALT 115* 130*  ALKPHOS 166* 171*  PROT 6.9 7.2  ALBUMIN 2.9* 3.0*    TUMOR MARKERS: No results for input(s): AFPTM, CEA, CA199, CHROMGRNA in the last 8760 hours.  Assessment and Plan: 65 y.o. male with recent diagnosis of pancreatic mass with biliary obstruction.  ERCP with stent placement on 08/13/2020 was unsuccessful.  IR was requested for image guided biliary drain placement. Case was reviewed and approved by Dr. Serafina Royals. VS hypertensive 140/54 but stable CBC shows mild anemia Hgb 12.7, unremarkable otherwise CMP shows worsening LFT, AST 187, ALT 130, T bili 19.1 (was 18.0 yesterday).  RF normal. INR 1.5 - will repeat INR tomorrow am  Not on anticoag/antiplt   Risks and benefits of biliary drain placement was discussed with the patient including, but not limited to bleeding, infection which may lead to sepsis or even death and damage to adjacent structures.  All of the patient's questions were answered, patient is agreeable to proceed.  Consent signed and in chart.  Procedure is tentatively scheduled for tomorrow pending IR schedule.   Thank you for this interesting consult.  I greatly enjoyed meeting Jeff Jordan and look forward to participating in their care.  A copy of this report was sent to the requesting provider on this date.  Electronically Signed: Tera Mater, PA-C 08/15/2020, 2:55 PM   I spent a total of 30 min  in face to face in clinical consultation, greater than 50% of which was counseling/coordinating care for biliary drain placement

## 2020-08-15 NOTE — Progress Notes (Signed)
     [facility] Progress Note  CC:  Biliary obstruction  Subjective:  Having some pain on his right side.  Waiting for IR to come and see him.  Complains of a bad taste in his mouth.  Objective:  Vital signs in last 24 hours: Temp:  [97.5 F (36.4 C)-98.7 F (37.1 C)] 98.1 F (36.7 C) (07/20 0335) Pulse Rate:  [83-97] 96 (07/20 0335) Resp:  [14-18] 16 (07/20 0335) BP: (134-164)/(47-65) 134/56 (07/20 0335) SpO2:  [99 %-100 %] 99 % (07/20 0335) Weight:  [68 kg] 68 kg (07/19 1203) Last BM Date: 08/14/20 General:  Alert, Well-developed, in NAD; scleral icterus noted Heart:  Regular rate and rhythm; no murmurs Pulm:  CTAB.  No W/R/R. Abdomen:  Soft, non-distended.  BS present.  Non-tender. Extremities:  Without edema. Neurologic:  Alert and oriented x 4;  grossly normal neurologically. Psych:  Alert and cooperative. Normal mood and affect.  Intake/Output from previous day: 07/19 0701 - 07/20 0700 In: 1100 [P.O.:100; I.V.:1000] Out: 50 [Blood:50]  Lab Results: Recent Labs    08/14/20 1934 08/15/20 0452  WBC 5.7 10.0  HGB 13.1 12.7*  HCT 37.6* 36.0*  PLT 264 274   BMET Recent Labs    08/14/20 1934 08/15/20 0452  NA 142 144  K 4.9 4.6  CL 99 100  CO2 26 25  GLUCOSE 160* 127*  BUN 26* 25*  CREATININE 1.15 0.95  CALCIUM 9.6 9.8   LFT Recent Labs    08/15/20 0452  PROT 7.2  ALBUMIN 3.0*  AST 187*  ALT 130*  ALKPHOS 171*  BILITOT 19.1*  BILIDIR 11.4*   PT/INR Recent Labs    08/14/20 1934 08/15/20 0452  LABPROT 19.6* 18.4*  INR 1.7* 1.5*    DG ERCP  Result Date: 08/14/2020 CLINICAL DATA:  Pancreatic mass and biliary obstruction. EXAM: ERCP TECHNIQUE: Multiple spot images obtained with the fluoroscopic device and submitted for interpretation post-procedure. COMPARISON:  CT of the abdomen on 07/27/2020 at [place]: Imaging with a C-arm demonstrates attempted cannulation of the ampulla and common bile duct. Cholangiogram was  not able to be performed. IMPRESSION: Attempted cannulation of the ampulla and common bile duct. These images were submitted for radiologic interpretation only. Please see the procedural report for the amount of contrast and the fluoroscopy time utilized. Electronically Signed   By: Aletta Edouard M.D.   On: 08/14/2020 15:00    Assessment / Plan: *Biliary obstruction from suspected pancreatic adenocarcinoma:  S/p EUS on 7/19.  Pathology/cytology pending.  CA 19-9 pending.  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  He is NPO for PBD with IR hopefully today vs tomorrow.  Trend LFTs.  I placed a nutrition consult as well.    LOS: 0 days   Jeff Jordan. Margarit Minshall  08/15/2020, 9:23 AM

## 2020-08-16 ENCOUNTER — Inpatient Hospital Stay (HOSPITAL_COMMUNITY): Payer: Medicare HMO

## 2020-08-16 ENCOUNTER — Inpatient Hospital Stay: Payer: Medicare HMO | Admitting: Oncology

## 2020-08-16 DIAGNOSIS — D62 Acute posthemorrhagic anemia: Secondary | ICD-10-CM | POA: Diagnosis not present

## 2020-08-16 DIAGNOSIS — K8689 Other specified diseases of pancreas: Secondary | ICD-10-CM | POA: Diagnosis not present

## 2020-08-16 DIAGNOSIS — K920 Hematemesis: Secondary | ICD-10-CM | POA: Diagnosis not present

## 2020-08-16 DIAGNOSIS — I10 Essential (primary) hypertension: Secondary | ICD-10-CM | POA: Diagnosis not present

## 2020-08-16 DIAGNOSIS — K831 Obstruction of bile duct: Secondary | ICD-10-CM | POA: Diagnosis not present

## 2020-08-16 DIAGNOSIS — K315 Obstruction of duodenum: Secondary | ICD-10-CM | POA: Diagnosis not present

## 2020-08-16 DIAGNOSIS — C250 Malignant neoplasm of head of pancreas: Secondary | ICD-10-CM | POA: Diagnosis not present

## 2020-08-16 HISTORY — PX: IR BILIARY DRAIN PLACEMENT WITH CHOLANGIOGRAM: IMG6043

## 2020-08-16 LAB — CBC
HCT: 33.4 % — ABNORMAL LOW (ref 39.0–52.0)
Hemoglobin: 11.4 g/dL — ABNORMAL LOW (ref 13.0–17.0)
MCH: 28 pg (ref 26.0–34.0)
MCHC: 34.1 g/dL (ref 30.0–36.0)
MCV: 82.1 fL (ref 80.0–100.0)
Platelets: 278 10*3/uL (ref 150–400)
RBC: 4.07 MIL/uL — ABNORMAL LOW (ref 4.22–5.81)
RDW: 25.7 % — ABNORMAL HIGH (ref 11.5–15.5)
WBC: 10.4 10*3/uL (ref 4.0–10.5)
nRBC: 0 % (ref 0.0–0.2)

## 2020-08-16 LAB — LIPASE, BLOOD: Lipase: 83 U/L — ABNORMAL HIGH (ref 11–51)

## 2020-08-16 LAB — CANCER ANTIGEN 19-9: CA 19-9: 1046 U/mL — ABNORMAL HIGH (ref 0–35)

## 2020-08-16 LAB — COMPREHENSIVE METABOLIC PANEL
ALT: 153 U/L — ABNORMAL HIGH (ref 0–44)
AST: 197 U/L — ABNORMAL HIGH (ref 15–41)
Albumin: 3.1 g/dL — ABNORMAL LOW (ref 3.5–5.0)
Alkaline Phosphatase: 170 U/L — ABNORMAL HIGH (ref 38–126)
Anion gap: 17 — ABNORMAL HIGH (ref 5–15)
BUN: 24 mg/dL — ABNORMAL HIGH (ref 8–23)
CO2: 25 mmol/L (ref 22–32)
Calcium: 10.2 mg/dL (ref 8.9–10.3)
Chloride: 107 mmol/L (ref 98–111)
Creatinine, Ser: 0.73 mg/dL (ref 0.61–1.24)
GFR, Estimated: >60 mL/min (ref >60)
Glucose, Bld: 150 mg/dL — ABNORMAL HIGH (ref 70–99)
Potassium: 4.3 mmol/L (ref 3.5–5.1)
Sodium: 149 mmol/L — ABNORMAL HIGH (ref 135–145)
Total Bilirubin: 23.7 mg/dL (ref 0.3–1.2)
Total Protein: 7.5 g/dL (ref 6.5–8.1)

## 2020-08-16 LAB — GLUCOSE, CAPILLARY
Glucose-Capillary: 160 mg/dL — ABNORMAL HIGH (ref 70–99)
Glucose-Capillary: 142 mg/dL — ABNORMAL HIGH (ref 70–99)
Glucose-Capillary: 194 mg/dL — ABNORMAL HIGH (ref 70–99)
Glucose-Capillary: 235 mg/dL — ABNORMAL HIGH (ref 70–99)

## 2020-08-16 LAB — PROTIME-INR
INR: 1.1 (ref 0.8–1.2)
Prothrombin Time: 13.8 seconds (ref 11.4–15.2)

## 2020-08-16 MED ORDER — FENTANYL CITRATE (PF) 100 MCG/2ML IJ SOLN
INTRAMUSCULAR | Status: AC
  Filled 2020-08-16: qty 4

## 2020-08-16 MED ORDER — IOHEXOL 9 MG/ML PO SOLN
ORAL | Status: AC
  Filled 2020-08-16: qty 1000

## 2020-08-16 MED ORDER — LIDOCAINE HCL 1 % IJ SOLN
INTRAMUSCULAR | Status: AC | PRN
  Administered 2020-08-16: 5 mL

## 2020-08-16 MED ORDER — SODIUM CHLORIDE 0.9% FLUSH
5.0000 mL | Freq: Three times a day (TID) | INTRAVENOUS | Status: DC
  Administered 2020-08-16 – 2020-09-06 (×31): 5 mL

## 2020-08-16 MED ORDER — FENTANYL CITRATE (PF) 100 MCG/2ML IJ SOLN
INTRAMUSCULAR | Status: AC | PRN
  Administered 2020-08-16 (×4): 50 ug via INTRAVENOUS

## 2020-08-16 MED ORDER — IOHEXOL 300 MG/ML  SOLN
50.0000 mL | Freq: Once | INTRAMUSCULAR | Status: AC | PRN
  Administered 2020-08-16: 15 mL

## 2020-08-16 MED ORDER — LIDOCAINE HCL 1 % IJ SOLN
INTRAMUSCULAR | Status: AC
  Filled 2020-08-16: qty 20

## 2020-08-16 MED ORDER — PANTOPRAZOLE SODIUM 40 MG IV SOLR
40.0000 mg | Freq: Two times a day (BID) | INTRAVENOUS | Status: DC
  Administered 2020-08-19 – 2020-08-22 (×6): 40 mg via INTRAVENOUS
  Filled 2020-08-16 (×6): qty 40

## 2020-08-16 MED ORDER — IOHEXOL 350 MG/ML SOLN
80.0000 mL | Freq: Once | INTRAVENOUS | Status: AC | PRN
  Administered 2020-08-16: 80 mL via INTRAVENOUS

## 2020-08-16 MED ORDER — MIDAZOLAM HCL 2 MG/2ML IJ SOLN
INTRAMUSCULAR | Status: AC
  Filled 2020-08-16: qty 4

## 2020-08-16 MED ORDER — MIDAZOLAM HCL 2 MG/2ML IJ SOLN
INTRAMUSCULAR | Status: AC | PRN
  Administered 2020-08-16 (×2): 1 mg via INTRAVENOUS

## 2020-08-16 MED ORDER — PANTOPRAZOLE INFUSION (NEW) - SIMPLE MED
8.0000 mg/h | INTRAVENOUS | Status: DC
  Administered 2020-08-16 – 2020-08-17 (×2): 8 mg/h via INTRAVENOUS
  Filled 2020-08-16 (×2): qty 80
  Filled 2020-08-16 (×3): qty 100

## 2020-08-16 MED ORDER — IOHEXOL 9 MG/ML PO SOLN
500.0000 mL | ORAL | Status: AC
Start: 2020-08-16 — End: 2020-08-16

## 2020-08-16 NOTE — Progress Notes (Signed)
     [facility] Progress Note  CC:  Biliary obstruction  Subjective:  Had an episode of emesis that consisted of thick bloody clots around 1 AM then had a BM with maroon colored blood around 8 AM.  CBC is pending.  Is supposed to have PBD today at noon.  Objective:  Vital signs in last 24 hours: Temp:  [97.7 F (36.5 C)-98.2 F (36.8 C)] 98.2 F (36.8 C) (07/21 0515) Pulse Rate:  [99-109] 105 (07/21 0515) Resp:  [16-19] 19 (07/21 0515) BP: (134-168)/(49-85) 159/85 (07/21 0515) SpO2:  [95 %-100 %] 100 % (07/21 0515) Last BM Date: 08/15/20 General:  Alert, chronically ill-appearing, in NAD; deep scleral icterus noted Heart:  Regular rate and rhythm; no murmurs Pulm:  CTAB.  No W/R/R. Abdomen:  Soft, non-distended.  BS present.  Non-tender. Extremities:  Without edema. Neurologic:  Alert and oriented x 4;  grossly normal neurologically.  Intake/Output from previous day: 07/20 0701 - 07/21 0700 In: 120 [P.O.:120] Out: 75 [Emesis/NG output:75]  Lab Results: Recent Labs    08/14/20 1934 08/15/20 0452  WBC 5.7 10.0  HGB 13.1 12.7*  HCT 37.6* 36.0*  PLT 264 274   BMET Recent Labs    08/14/20 1934 08/15/20 0452  NA 142 144  K 4.9 4.6  CL 99 100  CO2 26 25  GLUCOSE 160* 127*  BUN 26* 25*  CREATININE 1.15 0.95  CALCIUM 9.6 9.8   LFT Recent Labs    08/15/20 0452  PROT 7.2  ALBUMIN 3.0*  AST 187*  ALT 130*  ALKPHOS 171*  BILITOT 19.1*  BILIDIR 11.4*   PT/INR Recent Labs    08/15/20 0452 08/16/20 0553  LABPROT 18.4* 13.8  INR 1.5* 1.1    DG ERCP  Result Date: 08/14/2020 CLINICAL DATA:  Pancreatic mass and biliary obstruction. EXAM: ERCP TECHNIQUE: Multiple spot images obtained with the fluoroscopic device and submitted for interpretation post-procedure. COMPARISON:  CT of the abdomen on 07/27/2020 at [place]: Imaging with a C-arm demonstrates attempted cannulation of the ampulla and common bile duct. Cholangiogram was not  able to be performed. IMPRESSION: Attempted cannulation of the ampulla and common bile duct. These images were submitted for radiologic interpretation only. Please see the procedural report for the amount of contrast and the fluoroscopy time utilized. Electronically Signed   By: Aletta Edouard M.D.   On: 08/14/2020 15:00    Assessment / Plan: *Biliary obstruction from suspected pancreatic adenocarcinoma:  S/p EUS on 7/19.  Pathology/cytology pending.  CA 19-9 is 1046.  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  He is NPO for PBD with IR hopefully today.  Trend LFTs.  *Hematemesis/maroon stool: Each occurred once this morning.  CBC is pending.  He did have a lot of oozing after his EUS procedure and INR was elevated so he received some IV vitamin K.  INR is 1.1 this morning.  He is at risk of duodenal stenosis causing obstruction and subsequently leading to some backup, which could cause him to retch and vomit and bleeding occurring with that.  We will follow-up CBC.  Change of from once daily p.o. PPI to PPI drip for now.  No plans for repeat upper endoscopy unless significant bleeding occurs.     LOS: 1 day   Jeff Jordan. Jeff Jordan  08/16/2020, 9:38 AM

## 2020-08-16 NOTE — Progress Notes (Signed)
Dr [place] Pink notified of patient having BM with Maroon colored stool. No new orders. Will continue to monitor.

## 2020-08-16 NOTE — Progress Notes (Signed)
   08/16/20 1738  Assess: MEWS Score  Temp 97.9 F (36.6 C)  BP (!) 150/57  Pulse Rate (!) 117  Resp 20  SpO2 98 %  Assess: MEWS Score  MEWS Temp 0  MEWS Systolic 0  MEWS Pulse 2  MEWS RR 0  MEWS LOC 0  MEWS Score 2  MEWS Score Color Yellow  Assess: if the MEWS score is Yellow or Red  Were vital signs taken at a resting state? Yes (yellow from procedure)  Focused Assessment No change from prior assessment  Does the patient meet 2 or more of the SIRS criteria? No  MEWS guidelines implemented *See Row Information* Yes  Treat  MEWS Interventions Administered scheduled meds/treatments  Pain Scale 0-10  Pain Score 3  Pain Type Surgical pain  Pain Location Abdomen  Pain Orientation Right;Upper  Pain Descriptors / Indicators Sharp  Pain Frequency Constant  Pain Onset On-going  Pain Intervention(s) Refused  Take Vital Signs  Increase Vital Sign Frequency  Yellow: Q 2hr X 2 then Q 4hr X 2, if remains yellow, continue Q 4hrs  Escalate  MEWS: Escalate Yellow: discuss with charge nurse/RN and consider discussing with provider and RRT  Notify: Charge Nurse/RN  Name of Charge Nurse/RN Notified Monica (other RN, This RN is charge)  Date Charge Nurse/RN Notified 08/16/20  Time Charge Nurse/RN Notified 1745  Notify: Provider  Provider Name/Title [place] Pink  Date Provider Notified 08/16/20  Time Provider Notified 1745  Notification Type Page  Notification Reason Change in status  Provider response No new orders  Date of Provider Response 08/16/20  Time of Provider Response 1750  Document  Patient Outcome Other (Comment)  Progress note created (see row info) Yes  Assess: SIRS CRITERIA  SIRS Temperature  0  SIRS Pulse 1  SIRS Respirations  0  SIRS WBC 0  SIRS Score Sum  1

## 2020-08-16 NOTE — Progress Notes (Signed)
0152 Pt vomited approx 75 mL to 100 mL thick bloody clots into sink VS 168/63 P 109 RR 16 O2 100% RA  stated he felt hot and then vomited Olena Heckle, NP made aware BP rechecked 134/53 HR 102 @ 0220 no new orders rec'd at this time advising to notify if pt c/o nausea or has another episode of bloody emesis.

## 2020-08-16 NOTE — Progress Notes (Signed)
PROGRESS NOTE  Jeff Jordan CHE:[id number] DOB: 10-Jan-1956 DOA: 08/14/2020 PCP: Georganna Skeans, PA-C  HPI/Recap of past 3 hours: 65 year old male with past medical history of hypertension and diabetes mellitus with recent diagnosis of pancreatic mass suspected to be pancreatic adenocarcinoma referred to Dr. Allison Quarry of gastroenterology for stent placement for obstruction and underwent ERCP on 7/19 with stent unable to be placed.  Patient was then directly admitted to the hospitalist service and seen by interventional radiology with plans for stent placement today.  In the interim, biopsy results positive for pancreatic adenocarcinoma and patient has been made a referral to oncology in [place].    Overnight, patient had 1 episode of throwing up clots.  This morning had large bowel movement with very maroon stool.  No further episodes.  Patient not hypotensive although did have some mild tachycardia.  Morning labs, bilirubin up to 23 and hemoglobin down 1 g from previous day.  Patient states pain is little better controlled.  He is tired.  Assessment/Plan: Principal Problem:   Pancreatic adenocarcinoma causing biliary obstruction: Unable to place ERCP.  Interventional radiology for stent placement on 7/21.  Nutrition consulted.  Referral made for oncology as outpatient  Questionable GI bleed with hematemesis and possible hematochezia: INR normal.  Not on blood thinning medication.  Continue to follow  Active Problems:   Diabetes mellitus type 2 in nonobese [facility]): Patient NPO.  On sliding scale.    Essential hypertension: On IV antihypertensives.   Code Status: Full code  Family Communication: Left message for family  Disposition Plan: Discharge after stent placement and no further bleeding episodes   Consultants: Interventional radiology Gastroenterology  Procedures: Outpatient ERCP attempted 7/19-unsuccessful Interventional radiology biliary drain placement for  7/21  Antimicrobials: IV cefoxitin x1 dose preop  DVT prophylaxis: SCDs  Level of care: Med-Surg   Objective: Vitals:   08/16/20 0220 08/16/20 0515  BP: (!) 134/53 (!) 159/85  Pulse: (!) 102 (!) 105  Resp:  19  Temp:  98.2 F (36.8 C)  SpO2: 100% 100%    Intake/Output Summary (Last 24 hours) at 08/16/2020 1434 Last data filed at 08/16/2020 0152 Gross per 24 hour  Intake 120 ml  Output 75 ml  Net 45 ml    Filed Weights   08/08/20 1138 08/14/20 1203  Weight: 72.6 kg 68 kg   Body mass index is 21.52 kg/m.  Exam:  General: Alert and oriented x3, fatigued HEENT: Normocephalic, atraumatic, sclera anicteric Cardiovascular: Regular rate and rhythm, S1-S2 Respiratory: Clear to auscultation bilaterally Abdomen: Soft, nondistended, hypoactive bowel sounds.  Minimal pain today Musculoskeletal: No clubbing or cyanosis or edema Skin: Jaundiced skin Psychiatry: Appropriate, no evidence of psychoses Neurology: No focal deficits   Data Reviewed: CBC: Recent Labs  Lab 08/14/20 1934 08/15/20 0452 08/16/20 1059  WBC 5.7 10.0 10.4  NEUTROABS 5.0  --   --   HGB 13.1 12.7* 11.4*  HCT 37.6* 36.0* 33.4*  MCV 80.9 79.6* 82.1  PLT 264 274 536    Basic Metabolic Panel: Recent Labs  Lab 08/14/20 1934 08/15/20 0452 08/16/20 0910  NA 142 144 149*  K 4.9 4.6 4.3  CL 99 100 107  CO2 26 25 25   GLUCOSE 160* 127* 150*  BUN 26* 25* 24*  CREATININE 1.15 0.95 0.73  CALCIUM 9.6 9.8 10.2    GFR: Estimated Creatinine Clearance: 87.4 mL/min (by C-G formula based on SCr of 0.73 mg/dL). Liver Function Tests: Recent Labs  Lab 08/14/20 1934 08/15/20 0452 08/16/20 0910  AST 163* 187* 197*  ALT 115* 130* 153*  ALKPHOS 166* 171* 170*  BILITOT 18.0* 19.1* 23.7*  PROT 6.9 7.2 7.5  ALBUMIN 2.9* 3.0* 3.1*    Recent Labs  Lab 08/16/20 0910  LIPASE 83*   No results for input(s): AMMONIA in the last 168 hours. Coagulation Profile: Recent Labs  Lab 08/14/20 1934  08/15/20 0452 08/16/20 0553  INR 1.7* 1.5* 1.1    Cardiac Enzymes: No results for input(s): CKTOTAL, CKMB, CKMBINDEX, TROPONINI in the last 168 hours. BNP (last 3 results) No results for input(s): PROBNP in the last 8760 hours. HbA1C: No results for input(s): HGBA1C in the last 72 hours. CBG: Recent Labs  Lab 08/15/20 0733 08/15/20 1205 08/15/20 1721 08/15/20 2105 08/16/20 0737  GLUCAP 135* 140* 148* 121* 160*    Lipid Profile: No results for input(s): CHOL, HDL, LDLCALC, TRIG, CHOLHDL, LDLDIRECT in the last 72 hours. Thyroid Function Tests: No results for input(s): TSH, T4TOTAL, FREET4, T3FREE, THYROIDAB in the last 72 hours. Anemia Panel: No results for input(s): VITAMINB12, FOLATE, FERRITIN, TIBC, IRON, RETICCTPCT in the last 72 hours. Urine analysis: No results found for: COLORURINE, APPEARANCEUR, LABSPEC, PHURINE, GLUCOSEU, HGBUR, BILIRUBINUR, KETONESUR, PROTEINUR, UROBILINOGEN, NITRITE, LEUKOCYTESUR Sepsis Labs: @LABRCNTIP (procalcitonin:4,lacticidven:4)  ) Recent Results (from the past 240 hour(s))  SARS CORONAVIRUS 2 (TAT 6-24 HRS) Nasopharyngeal Nasopharyngeal Swab     Status: None   Collection Time: 08/14/20  6:04 PM   Specimen: Nasopharyngeal Swab  Result Value Ref Range Status   SARS Coronavirus 2 NEGATIVE NEGATIVE Final    Comment: (NOTE) SARS-CoV-2 target nucleic acids are NOT DETECTED.  The SARS-CoV-2 RNA is generally detectable in upper and lower respiratory specimens during the acute phase of infection. Negative results do not preclude SARS-CoV-2 infection, do not rule out co-infections with other pathogens, and should not be used as the sole basis for treatment or other patient management decisions. Negative results must be combined with clinical observations, patient history, and epidemiological information. The expected result is Negative.  Fact Sheet for Patients: SugarRoll.be  Fact Sheet for Healthcare  Providers: https://www.woods-mathews.com/  This test is not yet approved or cleared by the [place] FDA and  has been authorized for detection and/or diagnosis of SARS-CoV-2 by FDA under an Emergency Use Authorization (EUA). This EUA will remain  in effect (meaning this test can be used) for the duration of the COVID-19 declaration under Se ction 564(b)(1) of the Act, 21 U.S.C. section 360bbb-3(b)(1), unless the authorization is terminated or revoked sooner.  Performed at [facility], [place] [address], [place], [place] [address]        Studies: No results found.  Scheduled Meds:  amLODipine  5 mg Oral Daily   ferrous sulfate  325 mg Oral Q breakfast   insulin aspart  0-6 Units Subcutaneous TID WC   iohexol       [START ON 08/19/2020] pantoprazole  40 mg Intravenous Q12H   rosuvastatin  5 mg Oral Weekly    Continuous Infusions:  cefOXitin     pantoprazole 8 mg/hr (08/16/20 1145)     LOS: 1 day     Annita Brod, MD [facility]   08/16/2020, 2:34 PM

## 2020-08-16 NOTE — Procedures (Signed)
Pre procedural Dx: Obstructive Juandice Post procedural Dx: Same  Successful placement of a right hepatic approach transhepatic 10 Fr drainage catheter with end coiled and locked within the dilated CBD with persistent occlusion of the distal aspect of the CBD.  Biliary drain connected to gravity bag.  EBL: Minimal Complications: None immediate  PLAN: - Maintain external biliary drain to gravity bag - Obtain daily CMPs - Plan on attempting to internalize the external biliary drain early next week once bilirubin has been significantly reduced.   Ronny Bacon, MD Pager #: [phone]

## 2020-08-16 NOTE — Progress Notes (Signed)
Date and time results received: 08/16/20 0955 (use smartphrase ".now" to insert current time)  Test: Total Bili Critical Value: 23.7  Name of Provider Notified: Lyman Speller  Orders Received? Or Actions Taken?:  No new orders

## 2020-08-17 ENCOUNTER — Inpatient Hospital Stay (HOSPITAL_COMMUNITY): Payer: Medicare HMO

## 2020-08-17 ENCOUNTER — Encounter (HOSPITAL_COMMUNITY): Payer: Self-pay | Admitting: Interventional Radiology

## 2020-08-17 DIAGNOSIS — I10 Essential (primary) hypertension: Secondary | ICD-10-CM | POA: Diagnosis not present

## 2020-08-17 DIAGNOSIS — E119 Type 2 diabetes mellitus without complications: Secondary | ICD-10-CM | POA: Diagnosis not present

## 2020-08-17 DIAGNOSIS — C250 Malignant neoplasm of head of pancreas: Secondary | ICD-10-CM | POA: Diagnosis not present

## 2020-08-17 DIAGNOSIS — K831 Obstruction of bile duct: Secondary | ICD-10-CM | POA: Diagnosis not present

## 2020-08-17 DIAGNOSIS — K8689 Other specified diseases of pancreas: Secondary | ICD-10-CM | POA: Diagnosis not present

## 2020-08-17 HISTORY — PX: IR CONVERT BILIARY DRAIN TO INT EXT BILIARY DRAIN: IMG6045

## 2020-08-17 LAB — COMPREHENSIVE METABOLIC PANEL
ALT: 213 U/L — ABNORMAL HIGH (ref 0–44)
AST: 287 U/L — ABNORMAL HIGH (ref 15–41)
Albumin: 3.1 g/dL — ABNORMAL LOW (ref 3.5–5.0)
Alkaline Phosphatase: 179 U/L — ABNORMAL HIGH (ref 38–126)
Anion gap: 15 (ref 5–15)
BUN: 28 mg/dL — ABNORMAL HIGH (ref 8–23)
CO2: 25 mmol/L (ref 22–32)
Calcium: 10.2 mg/dL (ref 8.9–10.3)
Chloride: 116 mmol/L — ABNORMAL HIGH (ref 98–111)
Creatinine, Ser: 0.59 mg/dL — ABNORMAL LOW (ref 0.61–1.24)
GFR, Estimated: >60 mL/min (ref >60)
Glucose, Bld: 194 mg/dL — ABNORMAL HIGH (ref 70–99)
Potassium: 3.7 mmol/L (ref 3.5–5.1)
Sodium: 156 mmol/L — ABNORMAL HIGH (ref 135–145)
Total Bilirubin: 26.8 mg/dL (ref 0.3–1.2)
Total Protein: 7.3 g/dL (ref 6.5–8.1)

## 2020-08-17 LAB — CBC
HCT: 33.7 % — ABNORMAL LOW (ref 39.0–52.0)
Hemoglobin: 11.7 g/dL — ABNORMAL LOW (ref 13.0–17.0)
MCH: 28.3 pg (ref 26.0–34.0)
MCHC: 34.7 g/dL (ref 30.0–36.0)
MCV: 81.6 fL (ref 80.0–100.0)
Platelets: 276 10*3/uL (ref 150–400)
RBC: 4.13 MIL/uL — ABNORMAL LOW (ref 4.22–5.81)
RDW: 25.4 % — ABNORMAL HIGH (ref 11.5–15.5)
WBC: 11.7 10*3/uL — ABNORMAL HIGH (ref 4.0–10.5)
nRBC: 0 % (ref 0.0–0.2)

## 2020-08-17 LAB — GLUCOSE, CAPILLARY
Glucose-Capillary: 175 mg/dL — ABNORMAL HIGH (ref 70–99)
Glucose-Capillary: 210 mg/dL — ABNORMAL HIGH (ref 70–99)
Glucose-Capillary: 225 mg/dL — ABNORMAL HIGH (ref 70–99)
Glucose-Capillary: 159 mg/dL — ABNORMAL HIGH (ref 70–99)

## 2020-08-17 MED ORDER — HYDROMORPHONE HCL 1 MG/ML IJ SOLN
INTRAMUSCULAR | Status: AC | PRN
  Administered 2020-08-17: 1 mg via INTRAVENOUS

## 2020-08-17 MED ORDER — MIDAZOLAM HCL 2 MG/2ML IJ SOLN
INTRAMUSCULAR | Status: AC
  Filled 2020-08-17: qty 6

## 2020-08-17 MED ORDER — MIDAZOLAM HCL 2 MG/2ML IJ SOLN
INTRAMUSCULAR | Status: AC | PRN
Start: 2020-08-17 — End: 2020-08-17
  Administered 2020-08-17 (×2): 1 mg via INTRAVENOUS
  Administered 2020-08-17: 2 mg via INTRAVENOUS

## 2020-08-17 MED ORDER — LIDOCAINE-EPINEPHRINE 1 %-1:100000 IJ SOLN
INTRAMUSCULAR | Status: AC
  Filled 2020-08-17: qty 1

## 2020-08-17 MED ORDER — FENTANYL CITRATE (PF) 100 MCG/2ML IJ SOLN
INTRAMUSCULAR | Status: AC | PRN
  Administered 2020-08-17 (×2): 50 ug via INTRAVENOUS

## 2020-08-17 MED ORDER — FENTANYL CITRATE (PF) 100 MCG/2ML IJ SOLN
INTRAMUSCULAR | Status: AC
  Filled 2020-08-17: qty 6

## 2020-08-17 MED ORDER — HYDROMORPHONE HCL 1 MG/ML IJ SOLN
INTRAMUSCULAR | Status: AC
  Filled 2020-08-17: qty 1

## 2020-08-17 MED ORDER — IOHEXOL 300 MG/ML  SOLN
25.0000 mL | Freq: Once | INTRAMUSCULAR | Status: AC | PRN
  Administered 2020-08-17: 25 mL

## 2020-08-17 MED ORDER — CIPROFLOXACIN HCL 500 MG PO TABS
500.0000 mg | ORAL_TABLET | Freq: Two times a day (BID) | ORAL | Status: AC
  Administered 2020-08-17 – 2020-08-21 (×10): 500 mg via ORAL
  Filled 2020-08-17 (×10): qty 1

## 2020-08-17 MED ORDER — LIDOCAINE HCL (PF) 1 % IJ SOLN
INTRAMUSCULAR | Status: AC | PRN
  Administered 2020-08-17: 10 mL via INTRADERMAL

## 2020-08-17 NOTE — Procedures (Signed)
Interventional Radiology Procedure Note  Procedure: Successful upsize and internalization to a 40F int/ext biliary drain.   Complications: None  Estimated Blood Loss: None  Recommendations: - Drain to bag until bilirubin improves - Flush drain Q shift    Signed,  Criselda Peaches, MD

## 2020-08-17 NOTE — Progress Notes (Addendum)
[facility] Progress Note  CC:  Biliary obstruction; pancreatic adenocarcinoma  Subjective:  Very sleepy this morning.  Says that it aches on the right side.  Biliary drain with dark green bile.  Objective:  Vital signs in last 24 hours: Temp:  [97.6 F (36.4 C)-98.8 F (37.1 C)] 97.6 F (36.4 C) (07/22 0831) Pulse Rate:  [98-137] 110 (07/22 0831) Resp:  [11-22] 16 (07/22 0831) BP: (145-194)/(51-107) 155/59 (07/22 0831) SpO2:  [97 %-100 %] 100 % (07/22 0831) Last BM Date: 08/15/20 General:  Alert, chronically ill-appearing, in NAD; scleral icterus noted Heart:  Tachy; no murmurs Pulm:  CTAB.  No W/R/R. Abdomen:  Soft, non-distended.  BS present.  Right sided TTP. Extremities:  Without edema.  Intake/Output from previous day: 07/21 0701 - 07/22 0700 In: -  Out: 200 [Drains:200] Intake/Output this shift: Total I/O In: 120 [P.O.:120] Out: 50 [Drains:50]  Lab Results: Recent Labs    08/15/20 0452 08/16/20 1059 08/17/20 0519  WBC 10.0 10.4 11.7*  HGB 12.7* 11.4* 11.7*  HCT 36.0* 33.4* 33.7*  PLT 274 278 276   BMET Recent Labs    08/15/20 0452 08/16/20 0910 08/17/20 0519  NA 144 149* 156*  K 4.6 4.3 3.7  CL 100 107 116*  CO2 '25 25 25  '$ GLUCOSE 127* 150* 194*  BUN 25* 24* 28*  CREATININE 0.95 0.73 0.59*  CALCIUM 9.8 10.2 10.2   LFT Recent Labs    08/15/20 0452 08/16/20 0910 08/17/20 0519  PROT 7.2   < > 7.3  ALBUMIN 3.0*   < > 3.1*  AST 187*   < > 287*  ALT 130*   < > 213*  ALKPHOS 171*   < > 179*  BILITOT 19.1*   < > 26.8*  BILIDIR 11.4*  --   --    < > = values in this interval not displayed.   PT/INR Recent Labs    08/15/20 0452 08/16/20 0553  LABPROT 18.4* 13.8  INR 1.5* 1.1    CT CHEST W CONTRAST  Result Date: 08/16/2020 CLINICAL DATA:  Pancreatic cancer, staging EXAM: CT CHEST WITH CONTRAST TECHNIQUE: Multidetector CT imaging of the chest was performed during intravenous contrast administration. CONTRAST:  73m  OMNIPAQUE IOHEXOL 350 MG/ML SOLN COMPARISON:  Partial comparison to CT abdomen dated 07/27/2020 FINDINGS: Cardiovascular: Heart is normal in size.  No pericardial effusion. No evidence of thoracic aortic aneurysm. Atherosclerotic calcifications of the aortic arch. Mediastinum/Nodes: No suspicious mediastinal lymphadenopathy. Visualized thyroid is unremarkable. Lungs/Pleura: Mild bibasilar atelectasis. Mild centrilobular and paraseptal emphysematous changes, upper lung predominant. No focal consolidation. 5 x 3 mm subpleural nodule in the anterior right upper lobe (series 5/image 3). No pleural effusion or pneumothorax. Upper Abdomen: Moderate intrahepatic and extrahepatic ductal dilatation in this patient with known pancreatic head mass (not visualized). Stable 3.4 cm mass in the pancreatic tail extending to the splenic hilum (series 2/image 115), grossly unchanged. Heterogeneous enhancement of the bilateral kidneys, unchanged. Musculoskeletal: Cervical spine fixation hardware, incompletely visualized. Degenerative changes of the lower thoracic/upper lumbar spine. IMPRESSION: 4 mm (mean diameter) subpleural nodule in the anterior right upper lobe. This is not considered overly suspicious for isolated pulmonary metastasis, but warrants attention on follow-up. No findings specific for metastatic disease in the chest. Stable abdominal findings related to known pancreatic head malignancy with additional lesion in the pancreatic tail. Aortic Atherosclerosis (ICD10-I70.0) and Emphysema (ICD10-J43.9). Electronically Signed   By: SJulian HyM.D.   On: 08/16/2020 15:20   CT  PELVIS W CONTRAST  Result Date: 08/16/2020 CLINICAL DATA:  Pancreatic cancer, staging EXAM: CT PELVIS WITH CONTRAST TECHNIQUE: Multidetector CT imaging of the pelvis was performed using the standard protocol following the bolus administration of intravenous contrast. CONTRAST:  81m OMNIPAQUE IOHEXOL 350 MG/ML SOLN COMPARISON:  Partial  comparison to CT abdomen dated 07/27/2020 FINDINGS: Urinary Tract:  Thick-walled bladder. Bowel:  Sigmoid diverticulosis, without evidence of diverticulitis. Vascular/Lymphatic: No evidence of abdominal aortic aneurysm. Atherosclerotic calcifications of the abdominal aorta and branch vessels. No suspicious pelvic lymphadenopathy. Reproductive:  Prostatomegaly, suggesting BPH. Other:  Trace pelvic fluid (series 2/image 30). Musculoskeletal: No focal osseous lesions. IMPRESSION: No evidence of metastatic disease in the pelvis. 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 Electronically Signed   By: SJulian HyM.D.   On: 08/16/2020 15:21   IR BILIARY DRAIN PLACEMENT WITH CHOLANGIOGRAM  Result Date: 08/16/2020 INDICATION: Concern for metastatic pancreatic cancer, now with obstructive jaundice post failed attempt at internal biliary stent placement. Patient presents today for biliary drain placement. EXAM: ULTRASOUND AND FLUOROSCOPIC GUIDED PERCUTANEOUS TRANSHEPATIC CHOLANGIOGRAM AND BILIARY TUBE PLACEMENT COMPARISON:  CT abdomen pelvis-07/27/2020; chest CT-08/16/2020; ERCP-7/19/2 MEDICATIONS: Patient is currently admitted to hospital receiving intravenous antibiotics; The antibiotic was administered with an appropriate time frame prior to the initiation of the procedure CONTRAST:  149mOMNIPAQUE IOHEXOL 300 MG/ML SOLN - administered into the biliary tree. ANESTHESIA/SEDATION: Moderate (conscious) sedation was employed during this procedure. A total of Versed 2 mg and Fentanyl 200 mcg was administered intravenously. Moderate Sedation Time: 29 minutes. The patient's level of consciousness and vital signs were monitored continuously by radiology nursing throughout the procedure under my direct supervision. FLUOROSCOPY TIME:  4 minutes, 18 seconds (130000000Gy) COMPLICATIONS: None immediate. TECHNIQUE: Informed written consent was obtained from the patient after a discussion of the risks,  benefits and alternatives to treatment. Questions regarding the procedure were encouraged and answered. A timeout was performed prior to the initiation of the procedure. The right upper abdominal quadrant was prepped and draped in the usual sterile fashion, and a sterile drape was applied covering the operative field. Maximum barrier sterile technique with sterile gowns and gloves were used for the procedure. A timeout was performed prior to the initiation of the procedure. Ultrasound scanning of the right upper abdominal quadrant was performed to delineate the anatomy and avoid transgression of the gallbladder or the pleural. A spot along the right mid axillary line was marked fluoroscopically inferior to the right costophrenic angle. After the overlying soft tissues were anesthetized with 1% Lidocaine with epinephrine, under direct ultrasound guidance, a 22 gauge needle was utilized to cannulate the peripheral aspect of a right intrahepatic biliary duct. Appropriate position was confirmed with limited contrast injection. Next, the duct was cannulated with a Nitrex wire and dilated with an Accustick set under fluoroscopic guidance. Limited cholangiograms were performed in various obliquities confirming appropriate access. Attempts were made with a 4 French angled glide catheter to advance both a Glidewire beyond the obstruction of the distal aspect of the CBD, however this ultimately proved unsuccessful in part due to the patient's procedure related discomfort. As such, over a short Amplatz wire, the track was dilated ultimately allowing placement of a 10 French standard all-purpose drainage catheter with end coiled locked within the dilated CBD. Postprocedural spot fluoroscopic images were obtained. Dressings were applied. The patient tolerated the procedure well without immediate postprocedural complication though was experiencing expected postprocedural discomfort. FINDINGS: Sonographic evaluation of the liver  demonstrates moderate intrahepatic biliary  ductal dilatation as was demonstrated on preceding abdominal CT. Under direct ultrasound guidance, a dilated peripheral duct within the anterior segment of the right lobe of the liver was accessed allowing placement of a 10 French standard all-purpose drainage catheter with end coiled and locked within the dilated duodenum. Contrast injection demonstrates complete occlusion of the distal aspect of the CBD. There is minimal opacification of the cystic duct and gallbladder without definitive opacification of the left intrahepatic biliary tree. Limited attempts at traversing the distal obstruction of the CBD proved unsuccessful in part due to the patient's discomfort following drainage catheter placement. IMPRESSION: Successful placement of a 10.2 [place] all-purpose drainage catheter with end coiled and locked within the dilated CBD. PLAN: - Maintain external biliary drainage catheter to gravity bag. - Obtain daily CMPs - Once patient's bilirubin has significantly normalized (likely early next week), the patient may return for definitive cholangiogram and attempt at internalization of the drainage catheter. Note, this procedure may be performed at an outpatient basis though does require conscious sedation. Electronically Signed   By: Sandi Mariscal M.D.   On: 08/16/2020 17:10    Assessment / Plan: *Biliary obstruction from pancreatic adenocarcinoma:  S/p EUS on 7/19.  Pathology/cytology showing adenocarcinoma.  CA 19-9 is 1046.  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  PBD placed by IR 7/21.  LFTs up more today but likely from manipulation and will take some time to downtrend but will hopefully see that some in the next day or two. *Hematemesis/maroon stool: Each occurred once yesterday morning, none since.  He did have a lot of oozing after his EUS procedure and INR was elevated so he received some IV vitamin K.  INR was 1.1 yesterday AM.  He is at risk  of duodenal stenosis causing obstruction and subsequently leading to some backup, which could cause him to retch and vomit and bleeding occurring with that.  Hgb stable this AM at 11.7 grams.  Changed from once daily p.o. PPI to PPI drip on 7/21.  No plans for repeat upper endoscopy unless significant bleeding occurs.  **IR messaged and said that they are going to take him to try to internalize his drain today since his bili increased and that was unexpected.    LOS: 2 days   Laban Emperor. Aleathia Purdy  08/17/2020, 9:41 AM

## 2020-08-17 NOTE — Progress Notes (Signed)
Initial Nutrition Assessment  INTERVENTION:   -Once diet advanced: CLD: Boost Breeze po TID, each supplement provides 250 kcal and 9 grams of protein >FLD: Ensure Enlive po BID, each supplement provides 350 kcal and 20 grams of protein -Magic cup TID with meals, each supplement provides 290 kcal and 9 grams of protein   -Multivitamin with minerals daily  NUTRITION DIAGNOSIS:   Increased nutrient needs related to cancer and cancer related treatments as evidenced by estimated needs.  GOAL:   Patient will meet greater than or equal to 90% of their needs  MONITOR:   PO intake, Supplement acceptance, Diet advancement, Labs, Weight trends, I & O's  REASON FOR ASSESSMENT:   Consult Assessment of nutrition requirement/status  ASSESSMENT:   65 year old male with past medical history of hypertension and diabetes mellitus with recent diagnosis of pancreatic mass suspected to be pancreatic adenocarcinoma referred to Dr. Allison Quarry of gastroenterology for stent placement for obstruction and underwent ERCP on 7/19 with stent unable to be placed.  Patient was then directly admitted to the hospitalist service and seen by interventional radiology with plans for stent placement today.  In the interim, biopsy results positive for pancreatic adenocarcinoma.  7/19: s/p EGD 7/21: s/p biliary drain placement  Patient currently NPO. Having his biliary drain internalized by IR today. Has not stayed on a diet since admission 7/19.  Per chart review, pt was having N/V and poor appetite for a few days PTA. Per GI, pt with duodenal stenosis and pt will likely need protein supplements. Will leave recommendations above and order once able.  Per weight records, pt has lost 14 lbs since 7/7 (8% wt loss x 2 weeks, significant for time frame).   Medications: Ferrous sulfate, IV Zofran  Labs reviewed: CBGs: 175-235 Elevated Na   NUTRITION - FOCUSED PHYSICAL EXAM:  Unable to complete  Diet Order:    Diet Order             Diet NPO time specified  Diet effective now                   EDUCATION NEEDS:   Not appropriate for education at this time  Skin:  Skin Assessment: Reviewed RN Assessment  Last BM:  7/21 -type 6  Height:   Ht Readings from Last 1 Encounters:  08/14/20 '5\' 10"'$  (1.778 m)    Weight:   Wt Readings from Last 1 Encounters:  08/14/20 68 kg   BMI:  Body mass index is 21.52 kg/m.  Estimated Nutritional Needs:   Kcal:  2000-2200  Protein:  105-120g  Fluid:  2.2L/day  Clayton Bibles, MS, RD, LDN Inpatient Clinical Dietitian Contact information available via Amion

## 2020-08-17 NOTE — Progress Notes (Signed)
Referring Physician(s): Dr. Rush Landmark  Supervising Physician: Jacqulynn Cadet  Patient Status:  [facility] - In-pt  Chief Complaint: Pancreatic mass  Subjective: Patient resting in bed. Biliary drain in place with dark, blood-tinged output. Output seems to have slowed over the course of the day.   Allergies: Patient has no known allergies.  Medications: Prior to Admission medications   Medication Sig Start Date End Date Taking? Authorizing Provider  amLODipine (NORVASC) 5 MG tablet Take 5 mg by mouth daily. 06/19/20  Yes [provider]  aspirin 81 MG EC tablet Take 81 mg by mouth daily.   Yes [provider]  ferrous sulfate 325 (65 FE) MG tablet Take 325 mg by mouth daily with breakfast.   Yes [provider]  JARDIANCE 25 MG TABS tablet Take 25 mg by mouth daily. 07/23/20  Yes [provider]  loperamide (IMODIUM) 2 MG capsule Take by mouth as needed for diarrhea or loose stools.   Yes [provider]  metFORMIN (GLUCOPHAGE) 1000 MG tablet Take 1,000 mg by mouth 2 (two) times daily. 07/16/20  Yes [provider]  olmesartan (BENICAR) 40 MG tablet Take 40 mg by mouth daily. 06/25/20  Yes [provider]  omeprazole (PRILOSEC) 40 MG capsule Take 40 mg by mouth every morning. 03/01/20  Yes [provider]  ondansetron (ZOFRAN) 4 MG tablet Take 1 tablet (4 mg total) by mouth every 4 (four) hours as needed for nausea. 08/02/20  Yes Dayton Scrape A, NP  Oxycodone HCl 10 MG TABS Take 1 tablet (10 mg total) by mouth every 4 (four) hours as needed. Patient taking differently: Take 10 mg by mouth every 4 (four) hours as needed (pain). 08/02/20  Yes Dayton Scrape A, NP  pioglitazone (ACTOS) 45 MG tablet Take 45 mg by mouth daily. 06/25/20  Yes [provider]  rosuvastatin (CRESTOR) 5 MG tablet Take 5 mg by mouth once a week. 06/25/20  Yes [provider]     Vital Signs: BP (!) 152/70 (BP Location:  Right Arm)   Pulse (!) 102   Temp (!) 97.5 F (36.4 C) (Oral)   Resp 17   Ht '5\' 10"'$  (1.778 m)   Wt 150 lb (68 kg)   SpO2 100%   BMI 21.52 kg/m   Physical Exam NAD, alert Abdomen: soft, generalized tenderness.  Insertion site intact.  Dark, blood-tinged bilious output in collection bag.   Imaging: CT CHEST W CONTRAST  Result Date: 08/16/2020 CLINICAL DATA:  Pancreatic cancer, staging EXAM: CT CHEST WITH CONTRAST TECHNIQUE: Multidetector CT imaging of the chest was performed during intravenous contrast administration. CONTRAST:  21m OMNIPAQUE IOHEXOL 350 MG/ML SOLN COMPARISON:  Partial comparison to CT abdomen dated 07/27/2020 FINDINGS: Cardiovascular: Heart is normal in size.  No pericardial effusion. No evidence of thoracic aortic aneurysm. Atherosclerotic calcifications of the aortic arch. Mediastinum/Nodes: No suspicious mediastinal lymphadenopathy. Visualized thyroid is unremarkable. Lungs/Pleura: Mild bibasilar atelectasis. Mild centrilobular and paraseptal emphysematous changes, upper lung predominant. No focal consolidation. 5 x 3 mm subpleural nodule in the anterior right upper lobe (series 5/image 3). No pleural effusion or pneumothorax. Upper Abdomen: Moderate intrahepatic and extrahepatic ductal dilatation in this patient with known pancreatic head mass (not visualized). Stable 3.4 cm mass in the pancreatic tail extending to the splenic hilum (series 2/image 115), grossly unchanged. Heterogeneous enhancement of the bilateral kidneys, unchanged. Musculoskeletal: Cervical spine fixation hardware, incompletely visualized. Degenerative changes of the lower thoracic/upper lumbar spine. IMPRESSION: 4 mm (mean diameter) subpleural nodule  in the anterior right upper lobe. This is not considered overly suspicious for isolated pulmonary metastasis, but warrants attention on follow-up. No findings specific for metastatic disease in the chest. Stable abdominal findings related to known pancreatic  head malignancy with additional lesion in the pancreatic tail. Aortic Atherosclerosis (ICD10-I70.0) and Emphysema (ICD10-J43.9). Electronically Signed   By: Julian Hy M.D.   On: 08/16/2020 15:20   CT PELVIS W CONTRAST  Result Date: 08/16/2020 CLINICAL DATA:  Pancreatic cancer, staging EXAM: CT PELVIS WITH CONTRAST TECHNIQUE: Multidetector CT imaging of the pelvis was performed using the standard protocol following the bolus administration of intravenous contrast. CONTRAST:  26m OMNIPAQUE IOHEXOL 350 MG/ML SOLN COMPARISON:  Partial comparison to CT abdomen dated 07/27/2020 FINDINGS: Urinary Tract:  Thick-walled bladder. Bowel:  Sigmoid diverticulosis, without evidence of diverticulitis. Vascular/Lymphatic: No evidence of abdominal aortic aneurysm. Atherosclerotic calcifications of the abdominal aorta and branch vessels. No suspicious pelvic lymphadenopathy. Reproductive:  Prostatomegaly, suggesting BPH. Other:  Trace pelvic fluid (series 2/image 30). Musculoskeletal: No focal osseous lesions. IMPRESSION: No evidence of metastatic disease in the pelvis. 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 Electronically Signed   By: SJulian HyM.D.   On: 08/16/2020 15:21   DG ERCP  Result Date: 08/14/2020 CLINICAL DATA:  Pancreatic mass and biliary obstruction. EXAM: ERCP TECHNIQUE: Multiple spot images obtained with the fluoroscopic device and submitted for interpretation post-procedure. COMPARISON:  CT of the abdomen on 07/27/2020 at [facility] with a C-arm demonstrates attempted cannulation of the ampulla and common bile duct. Cholangiogram was not able to be performed. IMPRESSION: Attempted cannulation of the ampulla and common bile duct. These images were submitted for radiologic interpretation only. Please see the procedural report for the amount of contrast and the fluoroscopy time utilized. Electronically Signed   By: GAletta EdouardM.D.   On: 08/14/2020  15:00   IR BILIARY DRAIN PLACEMENT WITH CHOLANGIOGRAM  Result Date: 08/16/2020 INDICATION: Concern for metastatic pancreatic cancer, now with obstructive jaundice post failed attempt at internal biliary stent placement. Patient presents today for biliary drain placement. EXAM: ULTRASOUND AND FLUOROSCOPIC GUIDED PERCUTANEOUS TRANSHEPATIC CHOLANGIOGRAM AND BILIARY TUBE PLACEMENT COMPARISON:  CT abdomen pelvis-07/27/2020; chest CT-08/16/2020; ERCP-7/19/2 MEDICATIONS: Patient is currently admitted to hospital receiving intravenous antibiotics; The antibiotic was administered with an appropriate time frame prior to the initiation of the procedure CONTRAST:  114mOMNIPAQUE IOHEXOL 300 MG/ML SOLN - administered into the biliary tree. ANESTHESIA/SEDATION: Moderate (conscious) sedation was employed during this procedure. A total of Versed 2 mg and Fentanyl 200 mcg was administered intravenously. Moderate Sedation Time: 29 minutes. The patient's level of consciousness and vital signs were monitored continuously by radiology nursing throughout the procedure under my direct supervision. FLUOROSCOPY TIME:  4 minutes, 18 seconds (130000000Gy) COMPLICATIONS: None immediate. TECHNIQUE: Informed written consent was obtained from the patient after a discussion of the risks, benefits and alternatives to treatment. Questions regarding the procedure were encouraged and answered. A timeout was performed prior to the initiation of the procedure. The right upper abdominal quadrant was prepped and draped in the usual sterile fashion, and a sterile drape was applied covering the operative field. Maximum barrier sterile technique with sterile gowns and gloves were used for the procedure. A timeout was performed prior to the initiation of the procedure. Ultrasound scanning of the right upper abdominal quadrant was performed to delineate the anatomy and avoid transgression of the gallbladder or the pleural. A spot along the right mid axillary  line was  marked fluoroscopically inferior to the right costophrenic angle. After the overlying soft tissues were anesthetized with 1% Lidocaine with epinephrine, under direct ultrasound guidance, a 22 gauge needle was utilized to cannulate the peripheral aspect of a right intrahepatic biliary duct. Appropriate position was confirmed with limited contrast injection. Next, the duct was cannulated with a Nitrex wire and dilated with an Accustick set under fluoroscopic guidance. Limited cholangiograms were performed in various obliquities confirming appropriate access. Attempts were made with a 4 French angled glide catheter to advance both a Glidewire beyond the obstruction of the distal aspect of the CBD, however this ultimately proved unsuccessful in part due to the patient's procedure related discomfort. As such, over a short Amplatz wire, the track was dilated ultimately allowing placement of a 10 French standard all-purpose drainage catheter with end coiled locked within the dilated CBD. Postprocedural spot fluoroscopic images were obtained. Dressings were applied. The patient tolerated the procedure well without immediate postprocedural complication though was experiencing expected postprocedural discomfort. FINDINGS: Sonographic evaluation of the liver demonstrates moderate intrahepatic biliary ductal dilatation as was demonstrated on preceding abdominal CT. Under direct ultrasound guidance, a dilated peripheral duct within the anterior segment of the right lobe of the liver was accessed allowing placement of a 10 French standard all-purpose drainage catheter with end coiled and locked within the dilated duodenum. Contrast injection demonstrates complete occlusion of the distal aspect of the CBD. There is minimal opacification of the cystic duct and gallbladder without definitive opacification of the left intrahepatic biliary tree. Limited attempts at traversing the distal obstruction of the CBD proved  unsuccessful in part due to the patient's discomfort following drainage catheter placement. IMPRESSION: Successful placement of a 10.2 [place] all-purpose drainage catheter with end coiled and locked within the dilated CBD. PLAN: - Maintain external biliary drainage catheter to gravity bag. - Obtain daily CMPs - Once patient's bilirubin has significantly normalized (likely early next week), the patient may return for definitive cholangiogram and attempt at internalization of the drainage catheter. Note, this procedure may be performed at an outpatient basis though does require conscious sedation. Electronically Signed   By: Sandi Mariscal M.D.   On: 08/16/2020 17:10    Labs:  CBC: Recent Labs    08/14/20 1934 08/15/20 0452 08/16/20 1059 08/17/20 0519  WBC 5.7 10.0 10.4 11.7*  HGB 13.1 12.7* 11.4* 11.7*  HCT 37.6* 36.0* 33.4* 33.7*  PLT 264 274 278 276    COAGS: Recent Labs    08/14/20 1934 08/15/20 0452 08/16/20 0553  INR 1.7* 1.5* 1.1    BMP: Recent Labs    08/14/20 1934 08/15/20 0452 08/16/20 0910 08/17/20 0519  NA 142 144 149* 156*  K 4.9 4.6 4.3 3.7  CL 99 100 107 116*  CO2 '26 25 25 25  '$ GLUCOSE 160* 127* 150* 194*  BUN 26* 25* 24* 28*  CALCIUM 9.6 9.8 10.2 10.2  CREATININE 1.15 0.95 0.73 0.59*  GFRNONAA >60 >60 >60 >60    LIVER FUNCTION TESTS: Recent Labs    08/14/20 1934 08/15/20 0452 08/16/20 0910 08/17/20 0519  BILITOT 18.0* 19.1* 23.7* 26.8*  AST 163* 187* 197* 287*  ALT 115* 130* 153* 213*  ALKPHOS 166* 171* 170* 179*  PROT 6.9 7.2 7.5 7.3  ALBUMIN 2.9* 3.0* 3.1* 3.1*    Assessment and Plan: Pancreatic mass, biliary obstruction s/p external biliary drain placement 7/21 by Dr. Pascal Lux Patient with ongoing nausea today.  Tbili increased overnight 23.7  26.8. Output was 200 mL overnight, 50 mL  this AM, only a small amount of thick, dark, blood-tinged drainage in bag on assessment this afternoon.  Case discussed with Dr. Pascal Lux and Dr. Laurence Ferrari.  Plan  made to manipulate drain today to facilitate drainage, internalization if possible.  Patient has been NPO today.  He is agreeable to plan.    Electronically Signed: Docia Barrier, PA 08/17/2020, 3:18 PM   I spent a total of 15 Minutes at the the patient's bedside AND on the patient's hospital floor or unit, greater than 50% of which was counseling/coordinating care for pancreatic mass, biliary obstruction.

## 2020-08-17 NOTE — Progress Notes (Addendum)
PROGRESS NOTE  Jeff Jordan [id number] DOB: 11-04-1955 DOA: 08/14/2020 PCP: Georganna Skeans, PA-C  HPI/Recap of past 34 hours: 65 year old male with past medical history of hypertension and diabetes mellitus with recent diagnosis of pancreatic mass suspected to be pancreatic adenocarcinoma referred to Dr. Allison Quarry of gastroenterology for stent placement for obstruction and underwent ERCP on 7/19 with stent unable to be placed.  Patient was then directly admitted to the hospitalist service and seen by interventional radiology with plans for stent placement today.  In the interim, biopsy results positive for pancreatic adenocarcinoma and patient has been made a referral to oncology in [place].    Patient underwent biliary drain placement on 7/21.  By 7/22, bilirubin and transaminases actually increased so patient taken back down by interventional radiology for enlargement of drain and internalization.  Patient tolerated procedure well.  Seen prior to procedure.  Fatigued.  Assessment/Plan: Principal Problem:   Pancreatic adenocarcinoma causing biliary obstruction: Unable to place ERCP.  Interventional radiology for stent placement on 7/21.  Nutrition consulted.  Referral made for oncology as outpatient  Questionable GI bleed with hematemesis and possible hematochezia: INR normal.  Not on blood thinning medication.  Patient had episode of hematemesis on night of 7/20 and morning of 7/22 had bloody bowel movement.  Vitals have remained stable.  Hemoglobin as well.  Continue to monitor closely.  Active Problems:   Diabetes mellitus type 2 in nonobese [facility]): Patient NPO.  On sliding scale.    Essential hypertension: On IV antihypertensives.   Code Status: Full code  Family Communication: Left message for family  Disposition Plan: Discharge once bilirubin has decreased significantly and able to advance diet.   Consultants: Interventional  radiology Gastroenterology  Procedures: Outpatient ERCP attempted 7/19-unsuccessful Interventional radiology biliary drain placement 7/21 Enlargement and internalization of drain 7/22  Antimicrobials: IV cefoxitin x1 dose preop  DVT prophylaxis: SCDs  Level of care: Med-Surg   Objective: Vitals:   08/17/20 1635 08/17/20 1738  BP: (!) 155/74 (!) 164/66  Pulse: (!) 118 (!) 109  Resp: 17 18  Temp:  98 F (36.7 C)  SpO2: 100% 99%    Intake/Output Summary (Last 24 hours) at 08/17/2020 1749 Last data filed at 08/17/2020 1353 Gross per 24 hour  Intake 385.9 ml  Output 500 ml  Net -114.1 ml    Filed Weights   08/08/20 1138 08/14/20 1203  Weight: 72.6 kg 68 kg   Body mass index is 21.52 kg/m.  Exam:  General: Alert and oriented x3, fatigued HEENT: Normocephalic, atraumatic, sclera anicteric Cardiovascular: Regular rate and rhythm, S1-S2 Respiratory: Clear to auscultation bilaterally Abdomen: Soft, nondistended, hypoactive bowel sounds.  Minimal pain today Musculoskeletal: No clubbing or cyanosis or edema Skin: Jaundiced skin Psychiatry: Appropriate, no evidence of psychoses Neurology: No focal deficits   Data Reviewed: CBC: Recent Labs  Lab 08/14/20 1934 08/15/20 0452 08/16/20 1059 08/17/20 0519  WBC 5.7 10.0 10.4 11.7*  NEUTROABS 5.0  --   --   --   HGB 13.1 12.7* 11.4* 11.7*  HCT 37.6* 36.0* 33.4* 33.7*  MCV 80.9 79.6* 82.1 81.6  PLT 264 274 278 [id number]    Basic Metabolic Panel: Recent Labs  Lab 08/14/20 1934 08/15/20 0452 08/16/20 0910 08/17/20 0519  NA 142 144 149* 156*  K 4.9 4.6 4.3 3.7  CL 99 100 107 116*  CO2 '26 25 25 25  '$ GLUCOSE 160* 127* 150* 194*  BUN 26* 25* 24* 28*  CREATININE 1.15 0.95 0.73 0.59*  CALCIUM 9.6  9.8 10.2 10.2    GFR: Estimated Creatinine Clearance: 87.4 mL/min (A) (by C-G formula based on SCr of 0.59 mg/dL (L)). Liver Function Tests: Recent Labs  Lab 08/14/20 1934 08/15/20 0452 08/16/20 0910 08/17/20 0519   AST 163* 187* 197* 287*  ALT 115* 130* 153* 213*  ALKPHOS 166* 171* 170* 179*  BILITOT 18.0* 19.1* 23.7* 26.8*  PROT 6.9 7.2 7.5 7.3  ALBUMIN 2.9* 3.0* 3.1* 3.1*    Recent Labs  Lab 08/16/20 0910  LIPASE 83*    No results for input(s): AMMONIA in the last 168 hours. Coagulation Profile: Recent Labs  Lab 08/14/20 1934 08/15/20 0452 08/16/20 0553  INR 1.7* 1.5* 1.1    Cardiac Enzymes: No results for input(s): CKTOTAL, CKMB, CKMBINDEX, TROPONINI in the last 168 hours. BNP (last 3 results) No results for input(s): PROBNP in the last 8760 hours. HbA1C: No results for input(s): HGBA1C in the last 72 hours. CBG: Recent Labs  Lab 08/16/20 1741 08/16/20 2215 08/17/20 0806 08/17/20 1136 08/17/20 1723  GLUCAP 194* 235* 175* 210* 225*    Lipid Profile: No results for input(s): CHOL, HDL, LDLCALC, TRIG, CHOLHDL, LDLDIRECT in the last 72 hours. Thyroid Function Tests: No results for input(s): TSH, T4TOTAL, FREET4, T3FREE, THYROIDAB in the last 72 hours. Anemia Panel: No results for input(s): VITAMINB12, FOLATE, FERRITIN, TIBC, IRON, RETICCTPCT in the last 72 hours. Urine analysis: No results found for: COLORURINE, APPEARANCEUR, LABSPEC, PHURINE, GLUCOSEU, HGBUR, BILIRUBINUR, KETONESUR, PROTEINUR, UROBILINOGEN, NITRITE, LEUKOCYTESUR Sepsis Labs: '@LABRCNTIP'$ (procalcitonin:4,lacticidven:4)  ) Recent Results (from the past 240 hour(s))  SARS CORONAVIRUS 2 (TAT 6-24 HRS) Nasopharyngeal Nasopharyngeal Swab     Status: None   Collection Time: 08/14/20  6:04 PM   Specimen: Nasopharyngeal Swab  Result Value Ref Range Status   SARS Coronavirus 2 NEGATIVE NEGATIVE Final    Comment: (NOTE) SARS-CoV-2 target nucleic acids are NOT DETECTED.  The SARS-CoV-2 RNA is generally detectable in upper and lower respiratory specimens during the acute phase of infection. Negative results do not preclude SARS-CoV-2 infection, do not rule out co-infections with other pathogens, and should  not be used as the sole basis for treatment or other patient management decisions. Negative results must be combined with clinical observations, patient history, and epidemiological information. The expected result is Negative.  Fact Sheet for Patients: SugarRoll.be  Fact Sheet for Healthcare Providers: https://www.woods-mathews.com/  This test is not yet approved or cleared by the [place] FDA and  has been authorized for detection and/or diagnosis of SARS-CoV-2 by FDA under an Emergency Use Authorization (EUA). This EUA will remain  in effect (meaning this test can be used) for the duration of the COVID-19 declaration under Se ction 564(b)(1) of the Act, 21 U.S.C. section 360bbb-3(b)(1), unless the authorization is terminated or revoked sooner.  Performed at [facility], [place] [address], [place], [place] [address]        Studies: IR CONVERT BILIARY DRAIN TO INT EXT BILIARY DRAIN  Result Date: 08/17/2020 Criselda Peaches, MD     08/17/2020  4:56 PM Interventional Radiology Procedure Note Procedure: Successful upsize and internalization to a 102F int/ext biliary drain. Complications: None Estimated Blood Loss: None Recommendations: - Drain to bag until bilirubin improves - Flush drain Q shift Signed, Criselda Peaches, MD    Scheduled Meds:  amLODipine  5 mg Oral Daily   ciprofloxacin  500 mg Oral BID   ferrous sulfate  325 mg Oral Q breakfast   insulin aspart  0-6 Units Subcutaneous TID  WC   lidocaine-EPINEPHrine       [START ON 08/19/2020] pantoprazole  40 mg Intravenous Q12H   rosuvastatin  5 mg Oral Weekly   sodium chloride flush  5 mL Intracatheter Q8H    Continuous Infusions:  pantoprazole 8 mg/hr (08/17/20 1023)     LOS: 2 days     Annita Brod, MD [facility]   08/17/2020, 5:49 PM

## 2020-08-18 DIAGNOSIS — K831 Obstruction of bile duct: Secondary | ICD-10-CM

## 2020-08-18 DIAGNOSIS — R112 Nausea with vomiting, unspecified: Secondary | ICD-10-CM

## 2020-08-18 DIAGNOSIS — C259 Malignant neoplasm of pancreas, unspecified: Secondary | ICD-10-CM | POA: Diagnosis not present

## 2020-08-18 DIAGNOSIS — R1110 Vomiting, unspecified: Secondary | ICD-10-CM

## 2020-08-18 LAB — COMPREHENSIVE METABOLIC PANEL
ALT: 212 U/L — ABNORMAL HIGH (ref 0–44)
AST: 233 U/L — ABNORMAL HIGH (ref 15–41)
Albumin: 2.8 g/dL — ABNORMAL LOW (ref 3.5–5.0)
Alkaline Phosphatase: 170 U/L — ABNORMAL HIGH (ref 38–126)
Anion gap: 13 (ref 5–15)
BUN: 25 mg/dL — ABNORMAL HIGH (ref 8–23)
CO2: 27 mmol/L (ref 22–32)
Calcium: 10.2 mg/dL (ref 8.9–10.3)
Chloride: 119 mmol/L — ABNORMAL HIGH (ref 98–111)
Creatinine, Ser: 0.76 mg/dL (ref 0.61–1.24)
GFR, Estimated: >60 mL/min (ref >60)
Glucose, Bld: 200 mg/dL — ABNORMAL HIGH (ref 70–99)
Potassium: 3.5 mmol/L (ref 3.5–5.1)
Sodium: 159 mmol/L — ABNORMAL HIGH (ref 135–145)
Total Bilirubin: 23.1 mg/dL (ref 0.3–1.2)
Total Protein: 6.9 g/dL (ref 6.5–8.1)

## 2020-08-18 LAB — GLUCOSE, CAPILLARY
Glucose-Capillary: 192 mg/dL — ABNORMAL HIGH (ref 70–99)
Glucose-Capillary: 201 mg/dL — ABNORMAL HIGH (ref 70–99)
Glucose-Capillary: 187 mg/dL — ABNORMAL HIGH (ref 70–99)
Glucose-Capillary: 217 mg/dL — ABNORMAL HIGH (ref 70–99)

## 2020-08-18 LAB — CBC
HCT: 26.4 % — ABNORMAL LOW (ref 39.0–52.0)
Hemoglobin: 9.4 g/dL — ABNORMAL LOW (ref 13.0–17.0)
MCH: 29.2 pg (ref 26.0–34.0)
MCHC: 35.6 g/dL (ref 30.0–36.0)
MCV: 82 fL (ref 80.0–100.0)
Platelets: 271 10*3/uL (ref 150–400)
RBC: 3.22 MIL/uL — ABNORMAL LOW (ref 4.22–5.81)
RDW: 25.4 % — ABNORMAL HIGH (ref 11.5–15.5)
WBC: 13.6 10*3/uL — ABNORMAL HIGH (ref 4.0–10.5)
nRBC: 0 % (ref 0.0–0.2)

## 2020-08-18 MED ORDER — DEXTROSE 5 % IV SOLN: INTRAVENOUS | Status: DC

## 2020-08-18 MED ORDER — SODIUM CHLORIDE 0.9% FLUSH
5.0000 mL | Freq: Three times a day (TID) | INTRAVENOUS | Status: DC
Start: 2020-08-18 — End: 2020-09-06
  Administered 2020-08-18 – 2020-09-05 (×42): 5 mL

## 2020-08-18 MED ORDER — PROMETHAZINE HCL 25 MG PO TABS
12.5000 mg | ORAL_TABLET | Freq: Four times a day (QID) | ORAL | Status: DC | PRN
  Administered 2020-08-19: 12.5 mg via ORAL
  Filled 2020-08-18: qty 1

## 2020-08-18 NOTE — Progress Notes (Addendum)
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   IR placed stent on 7/21.   Dietitian consulted.   Referral made for oncology as outpatient, in [place]. Supportive care - pain control, antiemetics  Hypernatremia - start D5W.  Follow BMP.   Questionable GI bleed with hematemesis and possible hematochezia: INR normal.  Not on blood thinners.   Episode of hematemesis on night of 7/20 and morning of 7/22 had bloody bowel movement.   Pt has been hemodynamically stable and Hbg stable. --Monitor closely  Anemia - Hbg decline 11.7 >> 9.4.  No overt signs of bleeding.  Nonbloody vomit this AM.  Monitor CBC.   Diabetes mellitus type 2  - Diet resumed post-procedure.  --Sliding scale Novolog   Essential hypertension: amlodipine  Patient BMI: Body mass index is 21.52 kg/m.   DVT prophylaxis: SCDs Start: 08/14/20 1624   Diet:  Diet Orders (From admission, onward)     Start     Ordered   08/18/20 0324  Diet regular Room service appropriate? Yes; Fluid consistency: Thin  Diet effective now       Question Answer Comment  Room service appropriate? Yes   Fluid consistency: Thin      08/18/20 0323              Code Status: Full Code   Brief Narrative / Hospital Course to Date:   "65 year old male with past medical history of hypertension and diabetes mellitus with recent diagnosis of pancreatic mass suspected to be pancreatic adenocarcinoma referred to Dr. Allison Quarry of gastroenterology for stent placement for obstruction and underwent ERCP on 7/19 with stent unable to be placed.  Patient was then directly  admitted to the hospitalist service and seen by interventional radiology with plans for stent placement today.  In the interim, biopsy results positive for pancreatic adenocarcinoma and patient has been made a referral to oncology in [place].     Patient underwent biliary drain placement on 7/21.  By 7/22, bilirubin and transaminases actually increased so patient taken back down by IR for enlargement of drain and internalization.  Patient tolerated procedure well."  Subjective 08/18/20    Pt had N/V after having liquids at breakfast this AM.  Having epigastric pain.  Denies issues with drain this AM.  No other acute complaints.   Disposition Plan & Communication   Status is: Inpatient  Remains inpatient appropriate because:Inpatient level of care appropriate due to severity of illness  Dispo: The patient is from: Home              Anticipated d/c is to: Home              Patient currently is not medically stable to d/c.   Difficult to place patient No     Consults, Procedures, Significant Events   Consultants:  Interventional radiology Gastroenterology  Procedures:  Outpatient ERCP attempted 7/19-unsuccessful Interventional radiology biliary drain placement 7/21 Enlargement and internalization of drain 7/22   Antimicrobials:  Anti-infectives (From admission, onward)    Start     Dose/Rate Route Frequency Ordered Stop   08/17/20 0800  ciprofloxacin (CIPRO) tablet 500 mg        500 mg Oral 2 times daily 08/17/20 0411 08/22/20 0759   08/16/20 0600  cefOXitin (MEFOXIN) 2 g in sodium chloride 0.9 % 100 mL IVPB        2 g 200 mL/hr over 30 Minutes Intravenous To Radiology 08/14/20 1620 08/16/20 1704         Micro    Objective   Vitals:   08/17/20 2024 08/18/20 0011 08/18/20 0428 08/18/20 1402  BP: (!) 149/90 (!) 149/72 (!) 168/67 (!) 143/64  Pulse: (!) 119 (!) 125 (!) 111 87  Resp: '20 15 18 14  '$ Temp: 98.8 F (37.1 C) (!) 97.5 F (36.4 C) 98.9 F (37.2 C) 98.2  F (36.8 C)  TempSrc: Oral Oral Oral Oral  SpO2: 100% 96% 99% 97%  Weight:      Height:        Intake/Output Summary (Last 24 hours) at 08/18/2020 1525 Last data filed at 08/18/2020 1500 Gross per 24 hour  Intake 330 ml  Output 1250 ml  Net -920 ml   Filed Weights   08/08/20 1138 08/14/20 1203  Weight: 72.6 kg 68 kg    Physical Exam:  General exam: awake, alert, no acute distress HEENT: icteric sclera, moist mucus membranes, hearing grossly normal  Respiratory system: CTAB, no wheezes or rhonchi, normal respiratory effort. Cardiovascular system: normal S1/S2, RRR  Gastrointestinal system: soft, epigastric tenderness Central nervous system:no gross focal neurologic deficits, normal speech Extremities: moves all, no edema, normal tone Psychiatry: normal mood, congruent affect, judgement and insight appear normal  Labs   Data Reviewed: I have personally reviewed following labs and imaging studies  CBC: Recent Labs  Lab 08/14/20 1934 08/15/20 0452 08/16/20 1059 08/17/20 0519 08/18/20 0541  WBC 5.7 10.0 10.4 11.7* 13.6*  NEUTROABS 5.0  --   --   --   --   HGB 13.1 12.7* 11.4* 11.7* 9.4*  HCT 37.6* 36.0* 33.4* 33.7* 26.4*  MCV 80.9 79.6* 82.1 81.6 82.0  PLT 264 274 278 276 [id number]   Basic Metabolic Panel: Recent Labs  Lab 08/14/20 1934 08/15/20 0452 08/16/20 0910 08/17/20 0519 08/18/20 0541  NA 142 144 149* 156* 159*  K 4.9 4.6 4.3 3.7 3.5  CL 99 100 107 116* 119*  CO2 '26 25 25 25 27  '$ GLUCOSE 160* 127* 150* 194* 200*  BUN 26* 25* 24* 28* 25*  CREATININE 1.15 0.95 0.73 0.59* 0.76  CALCIUM 9.6 9.8 10.2 10.2 10.2   GFR: Estimated Creatinine Clearance: 87.4 mL/min (by C-G formula based on SCr of 0.76 mg/dL). Liver Function Tests: Recent Labs  Lab 08/14/20 1934 08/15/20 0452 08/16/20 0910 08/17/20 0519 08/18/20 0541  AST 163* 187* 197* 287* 233*  ALT 115* 130* 153* 213* 212*  ALKPHOS 166* 171* 170* 179* 170*  BILITOT 18.0* 19.1* 23.7* 26.8* 23.1*  PROT  6.9 7.2 7.5 7.3 6.9  ALBUMIN 2.9* 3.0* 3.1* 3.1* 2.8*   Recent Labs  Lab 08/16/20 0910  LIPASE 83*   No results for input(s): AMMONIA in the last 168 hours. Coagulation Profile: Recent Labs  Lab 08/14/20 1934 08/15/20 0452 08/16/20 0553  INR 1.7* 1.5* 1.1   Cardiac Enzymes: No results for input(s): CKTOTAL, CKMB, CKMBINDEX, TROPONINI in the last 168 hours. BNP (last 3 results) No results for input(s): PROBNP in the last 8760 hours. HbA1C: No results for input(s): HGBA1C in the last 72 hours. CBG: Recent Labs  Lab 08/17/20 1136 08/17/20 1723 08/17/20 2217  08/18/20 0814 08/18/20 1406  GLUCAP 210* 225* 159* 192* 201*   Lipid Profile: No results for input(s): CHOL, HDL, LDLCALC, TRIG, CHOLHDL, LDLDIRECT in the last 72 hours. Thyroid Function Tests: No results for input(s): TSH, T4TOTAL, FREET4, T3FREE, THYROIDAB in the last 72 hours. Anemia Panel: No results for input(s): VITAMINB12, FOLATE, FERRITIN, TIBC, IRON, RETICCTPCT in the last 72 hours. Sepsis Labs: No results for input(s): PROCALCITON, LATICACIDVEN in the last 168 hours.  Recent Results (from the past 240 hour(s))  SARS CORONAVIRUS 2 (TAT 6-24 HRS) Nasopharyngeal Nasopharyngeal Swab     Status: None   Collection Time: 08/14/20  6:04 PM   Specimen: Nasopharyngeal Swab  Result Value Ref Range Status   SARS Coronavirus 2 NEGATIVE NEGATIVE Final    Comment: (NOTE) SARS-CoV-2 target nucleic acids are NOT DETECTED.  The SARS-CoV-2 RNA is generally detectable in upper and lower respiratory specimens during the acute phase of infection. Negative results do not preclude SARS-CoV-2 infection, do not rule out co-infections with other pathogens, and should not be used as the sole basis for treatment or other patient management decisions. Negative results must be combined with clinical observations, patient history, and epidemiological information. The expected result is Negative.  Fact Sheet for  Patients: SugarRoll.be  Fact Sheet for Healthcare Providers: https://www.woods-mathews.com/  This test is not yet approved or cleared by the [place] FDA and  has been authorized for detection and/or diagnosis of SARS-CoV-2 by FDA under an Emergency Use Authorization (EUA). This EUA will remain  in effect (meaning this test can be used) for the duration of the COVID-19 declaration under Se ction 564(b)(1) of the Act, 21 U.S.C. section 360bbb-3(b)(1), unless the authorization is terminated or revoked sooner.  Performed at [facility], [place] [address] [place], [place] [address]       Imaging Studies   IR BILIARY DRAIN PLACEMENT WITH CHOLANGIOGRAM  Result Date: 08/16/2020 INDICATION: Concern for metastatic pancreatic cancer, now with obstructive jaundice post failed attempt at internal biliary stent placement. Patient presents today for biliary drain placement. EXAM: ULTRASOUND AND FLUOROSCOPIC GUIDED PERCUTANEOUS TRANSHEPATIC CHOLANGIOGRAM AND BILIARY TUBE PLACEMENT COMPARISON:  CT abdomen pelvis-07/27/2020; chest CT-08/16/2020; ERCP-7/19/2 MEDICATIONS: Patient is currently admitted to hospital receiving intravenous antibiotics; The antibiotic was administered with an appropriate time frame prior to the initiation of the procedure CONTRAST:  44m OMNIPAQUE IOHEXOL 300 MG/ML SOLN - administered into the biliary tree. ANESTHESIA/SEDATION: Moderate (conscious) sedation was employed during this procedure. A total of Versed 2 mg and Fentanyl 200 mcg was administered intravenously. Moderate Sedation Time: 29 minutes. The patient's level of consciousness and vital signs were monitored continuously by radiology nursing throughout the procedure under my direct supervision. FLUOROSCOPY TIME:  4 minutes, 18 seconds (10000000mGy) COMPLICATIONS: None immediate. TECHNIQUE: Informed written consent was obtained from the patient after a discussion of the risks,  benefits and alternatives to treatment. Questions regarding the procedure were encouraged and answered. A timeout was performed prior to the initiation of the procedure. The right upper abdominal quadrant was prepped and draped in the usual sterile fashion, and a sterile drape was applied covering the operative field. Maximum barrier sterile technique with sterile gowns and gloves were used for the procedure. A timeout was performed prior to the initiation of the procedure. Ultrasound scanning of the right upper abdominal quadrant was performed to delineate the anatomy and avoid transgression of the gallbladder or the pleural. A spot along the right mid axillary line was marked fluoroscopically inferior to the right  costophrenic angle. After the overlying soft tissues were anesthetized with 1% Lidocaine with epinephrine, under direct ultrasound guidance, a 22 gauge needle was utilized to cannulate the peripheral aspect of a right intrahepatic biliary duct. Appropriate position was confirmed with limited contrast injection. Next, the duct was cannulated with a Nitrex wire and dilated with an Accustick set under fluoroscopic guidance. Limited cholangiograms were performed in various obliquities confirming appropriate access. Attempts were made with a 4 French angled glide catheter to advance both a Glidewire beyond the obstruction of the distal aspect of the CBD, however this ultimately proved unsuccessful in part due to the patient's procedure related discomfort. As such, over a short Amplatz wire, the track was dilated ultimately allowing placement of a 10 French standard all-purpose drainage catheter with end coiled locked within the dilated CBD. Postprocedural spot fluoroscopic images were obtained. Dressings were applied. The patient tolerated the procedure well without immediate postprocedural complication though was experiencing expected postprocedural discomfort. FINDINGS: Sonographic evaluation of the liver  demonstrates moderate intrahepatic biliary ductal dilatation as was demonstrated on preceding abdominal CT. Under direct ultrasound guidance, a dilated peripheral duct within the anterior segment of the right lobe of the liver was accessed allowing placement of a 10 French standard all-purpose drainage catheter with end coiled and locked within the dilated duodenum. Contrast injection demonstrates complete occlusion of the distal aspect of the CBD. There is minimal opacification of the cystic duct and gallbladder without definitive opacification of the left intrahepatic biliary tree. Limited attempts at traversing the distal obstruction of the CBD proved unsuccessful in part due to the patient's discomfort following drainage catheter placement. IMPRESSION: Successful placement of a 10.2 [place] all-purpose drainage catheter with end coiled and locked within the dilated CBD. PLAN: - Maintain external biliary drainage catheter to gravity bag. - Obtain daily CMPs - Once patient's bilirubin has significantly normalized (likely early next week), the patient may return for definitive cholangiogram and attempt at internalization of the drainage catheter. Note, this procedure may be performed at an outpatient basis though does require conscious sedation. Electronically Signed   By: Sandi Mariscal M.D.   On: 08/16/2020 17:10   IR CONVERT BILIARY DRAIN TO INT EXT BILIARY DRAIN  Result Date: 08/17/2020 Criselda Peaches, MD     08/17/2020  4:56 PM Interventional Radiology Procedure Note Procedure: Successful upsize and internalization to a 43F int/ext biliary drain. Complications: None Estimated Blood Loss: None Recommendations: - Drain to bag until bilirubin improves - Flush drain Q shift Signed, Criselda Peaches, MD     Medications   Scheduled Meds:  amLODipine  5 mg Oral Daily   ciprofloxacin  500 mg Oral BID   ferrous sulfate  325 mg Oral Q breakfast   insulin aspart  0-6 Units Subcutaneous TID WC   [START ON  08/19/2020] pantoprazole  40 mg Intravenous Q12H   rosuvastatin  5 mg Oral Weekly   sodium chloride flush  5 mL Intracatheter Q8H   sodium chloride flush  5 mL Intracatheter Q8H   Continuous Infusions:     LOS: 3 days    Time spent: 30 minutes   Ezekiel Slocumb, DO [facility]  08/18/2020, 3:25 PM      If 7PM-7AM, please contact night-coverage. How to contact the [facility] Attending or Consulting provider [place] or covering provider during after hours [place], for this patient?    Check the care team in [facility] and look for a) attending/consulting [place] provider listed and b) the  [place] team listed Log into [email or web address] and use [place]'s universal password to access. If you do not have the password, please contact the hospital operator. Locate the [facility] provider you are looking for under [facility] and page to a number that you can be directly reached. If you still have difficulty reaching the provider, please page the [facility] (Director on Call) for the Hospitalists listed on amion for assistance.

## 2020-08-18 NOTE — Progress Notes (Signed)
[facility] ROUNDING NOTE   Subjective: Underwent successful conversion of biliary drain to internal/external biliary drain by IR yesterday.  No acute events overnight but did have nausea and nonbloody emesis this morning just after drinking some liquids with morning medications.  No overt bleeding overnight.   Objective: Vital signs in last 24 hours: Temp:  [97.5 F (36.4 C)-98.9 F (37.2 C)] 98.9 F (37.2 C) (07/23 0428) Pulse Rate:  [102-129] 111 (07/23 0428) Resp:  [15-28] 18 (07/23 0428) BP: (124-172)/(58-105) 168/67 (07/23 0428) SpO2:  [96 %-100 %] 99 % (07/23 0428) Last BM Date: 08/16/20 General: NAD Lungs:  CTA b/l, no w/r/r Heart:  RRR, no m/r/g Abdomen: Drain in place on right.  Abdomen soft, NT, ND, +BS Ext:  No c/c/e    Intake/Output from previous day: 07/22 0701 - 07/23 0700 In: 635.9 [P.O.:360; I.V.:265.9] Out: 1600 [Urine:1500; Drains:100] Intake/Output this shift: No intake/output data recorded.   Lab Results: Recent Labs    08/16/20 1059 08/17/20 0519 08/18/20 0541  WBC 10.4 11.7* 13.6*  HGB 11.4* 11.7* 9.4*  PLT 278 276 271  MCV 82.1 81.6 82.0   BMET Recent Labs    08/16/20 0910 08/17/20 0519 08/18/20 0541  NA 149* 156* 159*  K 4.3 3.7 3.5  CL 107 116* 119*  CO2 '25 25 27  '$ GLUCOSE 150* 194* 200*  BUN 24* 28* 25*  CREATININE 0.73 0.59* 0.76  CALCIUM 10.2 10.2 10.2   LFT Recent Labs    08/16/20 0910 08/17/20 0519 08/18/20 0541  PROT 7.5 7.3 6.9  ALBUMIN 3.1* 3.1* 2.8*  AST 197* 287* 233*  ALT 153* 213* 212*  ALKPHOS 170* 179* 170*  BILITOT 23.7* 26.8* 23.1*   PT/INR Recent Labs    08/16/20 0553  INR 1.1      Imaging/Other results: CT CHEST W CONTRAST  Result Date: 08/16/2020 CLINICAL DATA:  Pancreatic cancer, staging EXAM: CT CHEST WITH CONTRAST TECHNIQUE: Multidetector CT imaging of the chest was performed during intravenous contrast administration. CONTRAST:  70m OMNIPAQUE IOHEXOL 350 MG/ML SOLN  COMPARISON:  Partial comparison to CT abdomen dated 07/27/2020 FINDINGS: Cardiovascular: Heart is normal in size.  No pericardial effusion. No evidence of thoracic aortic aneurysm. Atherosclerotic calcifications of the aortic arch. Mediastinum/Nodes: No suspicious mediastinal lymphadenopathy. Visualized thyroid is unremarkable. Lungs/Pleura: Mild bibasilar atelectasis. Mild centrilobular and paraseptal emphysematous changes, upper lung predominant. No focal consolidation. 5 x 3 mm subpleural nodule in the anterior right upper lobe (series 5/image 3). No pleural effusion or pneumothorax. Upper Abdomen: Moderate intrahepatic and extrahepatic ductal dilatation in this patient with known pancreatic head mass (not visualized). Stable 3.4 cm mass in the pancreatic tail extending to the splenic hilum (series 2/image 115), grossly unchanged. Heterogeneous enhancement of the bilateral kidneys, unchanged. Musculoskeletal: Cervical spine fixation hardware, incompletely visualized. Degenerative changes of the lower thoracic/upper lumbar spine. IMPRESSION: 4 mm (mean diameter) subpleural nodule in the anterior right upper lobe. This is not considered overly suspicious for isolated pulmonary metastasis, but warrants attention on follow-up. No findings specific for metastatic disease in the chest. Stable abdominal findings related to known pancreatic head malignancy with additional lesion in the pancreatic tail. Aortic Atherosclerosis (ICD10-I70.0) and Emphysema (ICD10-J43.9). Electronically Signed   By: SJulian HyM.D.   On: 08/16/2020 15:20   CT PELVIS W CONTRAST  Result Date: 08/16/2020 CLINICAL DATA:  Pancreatic cancer, staging EXAM: CT PELVIS WITH CONTRAST TECHNIQUE: Multidetector CT imaging of the pelvis was performed using the standard protocol following the bolus administration of intravenous  contrast. CONTRAST:  56m OMNIPAQUE IOHEXOL 350 MG/ML SOLN COMPARISON:  Partial comparison to CT abdomen dated 07/27/2020  FINDINGS: Urinary Tract:  Thick-walled bladder. Bowel:  Sigmoid diverticulosis, without evidence of diverticulitis. Vascular/Lymphatic: No evidence of abdominal aortic aneurysm. Atherosclerotic calcifications of the abdominal aorta and branch vessels. No suspicious pelvic lymphadenopathy. Reproductive:  Prostatomegaly, suggesting BPH. Other:  Trace pelvic fluid (series 2/image 30). Musculoskeletal: No focal osseous lesions. IMPRESSION: No evidence of metastatic disease in the pelvis. 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 Electronically Signed   By: SJulian HyM.D.   On: 08/16/2020 15:21   IR BILIARY DRAIN PLACEMENT WITH CHOLANGIOGRAM  Result Date: 08/16/2020 INDICATION: Concern for metastatic pancreatic cancer, now with obstructive jaundice post failed attempt at internal biliary stent placement. Patient presents today for biliary drain placement. EXAM: ULTRASOUND AND FLUOROSCOPIC GUIDED PERCUTANEOUS TRANSHEPATIC CHOLANGIOGRAM AND BILIARY TUBE PLACEMENT COMPARISON:  CT abdomen pelvis-07/27/2020; chest CT-08/16/2020; ERCP-7/19/2 MEDICATIONS: Patient is currently admitted to hospital receiving intravenous antibiotics; The antibiotic was administered with an appropriate time frame prior to the initiation of the procedure CONTRAST:  117mOMNIPAQUE IOHEXOL 300 MG/ML SOLN - administered into the biliary tree. ANESTHESIA/SEDATION: Moderate (conscious) sedation was employed during this procedure. A total of Versed 2 mg and Fentanyl 200 mcg was administered intravenously. Moderate Sedation Time: 29 minutes. The patient's level of consciousness and vital signs were monitored continuously by radiology nursing throughout the procedure under my direct supervision. FLUOROSCOPY TIME:  4 minutes, 18 seconds (130000000Gy) COMPLICATIONS: None immediate. TECHNIQUE: Informed written consent was obtained from the patient after a discussion of the risks, benefits and alternatives to treatment. Questions  regarding the procedure were encouraged and answered. A timeout was performed prior to the initiation of the procedure. The right upper abdominal quadrant was prepped and draped in the usual sterile fashion, and a sterile drape was applied covering the operative field. Maximum barrier sterile technique with sterile gowns and gloves were used for the procedure. A timeout was performed prior to the initiation of the procedure. Ultrasound scanning of the right upper abdominal quadrant was performed to delineate the anatomy and avoid transgression of the gallbladder or the pleural. A spot along the right mid axillary line was marked fluoroscopically inferior to the right costophrenic angle. After the overlying soft tissues were anesthetized with 1% Lidocaine with epinephrine, under direct ultrasound guidance, a 22 gauge needle was utilized to cannulate the peripheral aspect of a right intrahepatic biliary duct. Appropriate position was confirmed with limited contrast injection. Next, the duct was cannulated with a Nitrex wire and dilated with an Accustick set under fluoroscopic guidance. Limited cholangiograms were performed in various obliquities confirming appropriate access. Attempts were made with a 4 French angled glide catheter to advance both a Glidewire beyond the obstruction of the distal aspect of the CBD, however this ultimately proved unsuccessful in part due to the patient's procedure related discomfort. As such, over a short Amplatz wire, the track was dilated ultimately allowing placement of a 10 French standard all-purpose drainage catheter with end coiled locked within the dilated CBD. Postprocedural spot fluoroscopic images were obtained. Dressings were applied. The patient tolerated the procedure well without immediate postprocedural complication though was experiencing expected postprocedural discomfort. FINDINGS: Sonographic evaluation of the liver demonstrates moderate intrahepatic biliary ductal  dilatation as was demonstrated on preceding abdominal CT. Under direct ultrasound guidance, a dilated peripheral duct within the anterior segment of the right lobe of the liver was accessed allowing placement of a 10 FrPakistantandard  all-purpose drainage catheter with end coiled and locked within the dilated duodenum. Contrast injection demonstrates complete occlusion of the distal aspect of the CBD. There is minimal opacification of the cystic duct and gallbladder without definitive opacification of the left intrahepatic biliary tree. Limited attempts at traversing the distal obstruction of the CBD proved unsuccessful in part due to the patient's discomfort following drainage catheter placement. IMPRESSION: Successful placement of a 10.2 [place] all-purpose drainage catheter with end coiled and locked within the dilated CBD. PLAN: - Maintain external biliary drainage catheter to gravity bag. - Obtain daily CMPs - Once patient's bilirubin has significantly normalized (likely early next week), the patient may return for definitive cholangiogram and attempt at internalization of the drainage catheter. Note, this procedure may be performed at an outpatient basis though does require conscious sedation. Electronically Signed   By: Sandi Mariscal M.D.   On: 08/16/2020 17:10   IR CONVERT BILIARY DRAIN TO INT EXT BILIARY DRAIN  Result Date: 08/17/2020 Criselda Peaches, MD     08/17/2020  4:56 PM Interventional Radiology Procedure Note Procedure: Successful upsize and internalization to a 57F int/ext biliary drain. Complications: None Estimated Blood Loss: None Recommendations: - Drain to bag until bilirubin improves - Flush drain Q shift Signed, Criselda Peaches, MD      Assessment and Plan:  1) Pancreatic Adenocarcinoma 2) Small bowel obstruction secondary to Pancreatic CA 3) Biliary obstruction secondary to Pancreatic CA 4) Nausea/vomiting - Underwent conversion of biliary drain to internal/external drain on  08/17/2020 by IR - T bili downtrending at 23.1 (from 26.8 yesterday) with liver enzymes largely stable/improved (AST/ALT/ALP 233/212/170 from 287/213/179 respectively) - Continue trending daily liver enzymes - Continue Zofran - Adding Phenergan - Adding Alka-Seltzer as patient reports this has improved his UGI symptoms in the past - Continue to encourage liquid p.o. intake.  If unable to tolerate or advance diet, may need to consider alternative nutrition in the coming days - Continue Protonix at 40 mg IV bid  5) Anemia - H/H downtrend at 9.4/26.4 from 11.7/33.7 yesterday.  No overt bleeding in the last 24+ hours - Continue high-dose PPI as outlined above - Observe for overt bleeding - Continue serial H/H checks  6) Hypernatremia - Management per primary Hospitalist service  GI service will follow peripherally through the remainder of the weekend.  Please do not hesitate to contact on-call GI with any additional questions or concerns.  Otherwise, if still in-house plan to see on Monday 7/25   Lavena Bullion, DO  08/18/2020, 8:54 AM [facility] Pager [phone]

## 2020-08-19 DIAGNOSIS — K831 Obstruction of bile duct: Secondary | ICD-10-CM | POA: Diagnosis not present

## 2020-08-19 LAB — BASIC METABOLIC PANEL
Anion gap: 9 (ref 5–15)
BUN: 29 mg/dL — ABNORMAL HIGH (ref 8–23)
CO2: 28 mmol/L (ref 22–32)
Calcium: 9.9 mg/dL (ref 8.9–10.3)
Chloride: 120 mmol/L — ABNORMAL HIGH (ref 98–111)
Creatinine, Ser: 0.83 mg/dL (ref 0.61–1.24)
GFR, Estimated: >60 mL/min (ref >60)
Glucose, Bld: 268 mg/dL — ABNORMAL HIGH (ref 70–99)
Potassium: 3.5 mmol/L (ref 3.5–5.1)
Sodium: 157 mmol/L — ABNORMAL HIGH (ref 135–145)

## 2020-08-19 LAB — GLUCOSE, CAPILLARY
Glucose-Capillary: 266 mg/dL — ABNORMAL HIGH (ref 70–99)
Glucose-Capillary: 226 mg/dL — ABNORMAL HIGH (ref 70–99)
Glucose-Capillary: 199 mg/dL — ABNORMAL HIGH (ref 70–99)
Glucose-Capillary: 201 mg/dL — ABNORMAL HIGH (ref 70–99)

## 2020-08-19 LAB — CBC
HCT: 33.6 % — ABNORMAL LOW (ref 39.0–52.0)
Hemoglobin: 11.4 g/dL — ABNORMAL LOW (ref 13.0–17.0)
MCH: 29 pg (ref 26.0–34.0)
MCHC: 33.9 g/dL (ref 30.0–36.0)
MCV: 85.5 fL (ref 80.0–100.0)
Platelets: 271 10*3/uL (ref 150–400)
RBC: 3.93 MIL/uL — ABNORMAL LOW (ref 4.22–5.81)
RDW: 25.9 % — ABNORMAL HIGH (ref 11.5–15.5)
WBC: 16.3 10*3/uL — ABNORMAL HIGH (ref 4.0–10.5)
nRBC: 0.3 % — ABNORMAL HIGH (ref 0.0–0.2)

## 2020-08-19 LAB — HEPATIC FUNCTION PANEL
ALT: 188 U/L — ABNORMAL HIGH (ref 0–44)
AST: 166 U/L — ABNORMAL HIGH (ref 15–41)
Albumin: 3 g/dL — ABNORMAL LOW (ref 3.5–5.0)
Alkaline Phosphatase: 160 U/L — ABNORMAL HIGH (ref 38–126)
Bilirubin, Direct: 12.4 mg/dL — ABNORMAL HIGH (ref 0.0–0.2)
Indirect Bilirubin: 8 mg/dL — ABNORMAL HIGH (ref 0.3–0.9)
Total Bilirubin: 20.4 mg/dL (ref 0.3–1.2)
Total Protein: 7 g/dL (ref 6.5–8.1)

## 2020-08-19 MED ORDER — INSULIN DETEMIR 100 UNIT/ML ~~LOC~~ SOLN
4.0000 [IU] | Freq: Every day | SUBCUTANEOUS | Status: DC
  Administered 2020-08-19 – 2020-08-20 (×2): 4 [IU] via SUBCUTANEOUS
  Filled 2020-08-19 (×2): qty 0.04

## 2020-08-19 MED ORDER — BOOST / RESOURCE BREEZE PO LIQD CUSTOM
1.0000 | Freq: Three times a day (TID) | ORAL | Status: DC
  Administered 2020-08-19 – 2020-09-06 (×31): 1 via ORAL

## 2020-08-19 MED ORDER — METRONIDAZOLE 500 MG/100ML IV SOLN
500.0000 mg | Freq: Three times a day (TID) | INTRAVENOUS | Status: DC
  Administered 2020-08-19 – 2020-08-21 (×5): 500 mg via INTRAVENOUS
  Filled 2020-08-19 (×5): qty 100

## 2020-08-19 NOTE — Progress Notes (Signed)
PROGRESS NOTE    Jeff Jordan   [id number]  DOB: 1955-04-21  PCP: Georganna Skeans, PA-C    DOA: 08/14/2020 LOS: 4   Assessment & Plan   Principal Problem:   Biliary obstruction Active Problems:   Pancreatic adenocarcinoma ([place])   Diabetes mellitus type 2 in nonobese [facility])   Essential hypertension   Non-intractable vomiting   Pancreatic adenocarcinoma complicated by biliary obstruction - Unable to place ERCP.   IR placed stent on 7/21.   Dietitian consulted.   Referral made for oncology as outpatient, in [place]. Supportive care - pain control, antiemetics On Cipro  Hypernatremia - continue on D5W, increased to 100/hr.  Follow BMP.  Leukocytosis - 13>>16k today - likely due to above.  Afebrile and without s/sx's of infection at this time. --Continue Cipro --Add Flagyl   Questionable GI bleed with hematemesis and possible hematochezia: INR normal.  Not on blood thinners.   Episode of hematemesis on night of 7/20 and morning of 7/22 had bloody bowel movement.   Pt has been hemodynamically stable and Hbg stable. --Monitor closely  Iron Deficiency Anemia - Hbg stable.  No signs of bleeding.  Monitor CBC. On iron supplrement.   Diabetes mellitus type 2  - Diet resumed post-procedure.  --Start low dose basal coverage --Sliding scale Novolog --Adjust insulin for IP goal 140-180   Essential hypertension: amlodipine  Patient BMI: Body mass index is 21.52 kg/m.   DVT prophylaxis: SCDs Start: 08/14/20 1624   Diet:  Diet Orders (From admission, onward)     Start     Ordered   08/18/20 0324  Diet regular Room service appropriate? Yes; Fluid consistency: Thin  Diet effective now       Question Answer Comment  Room service appropriate? Yes   Fluid consistency: Thin      08/18/20 0323              Code Status: Full Code   Brief Narrative / Hospital Course to Date:   "65 year old male with past medical history of hypertension and diabetes mellitus with  recent diagnosis of pancreatic mass suspected to be pancreatic adenocarcinoma referred to Dr. Allison Quarry of gastroenterology for stent placement for obstruction and underwent ERCP on 7/19 with stent unable to be placed.  Patient was then directly admitted to the hospitalist service and seen by interventional radiology with plans for stent placement today.  In the interim, biopsy results positive for pancreatic adenocarcinoma and patient has been made a referral to oncology in [place].     Patient underwent biliary drain placement on 7/21.  By 7/22, bilirubin and transaminases actually increased so patient taken back down by IR for enlargement of drain and internalization.  Patient tolerated procedure well."  Subjective 08/19/20    Pt feeling well when seen this AM.  Denies having abdominal pain or N/V.  Denies fever/chills.  Does have a headache this morning.  No other acute complaints.    Disposition Plan & Communication   Status is: Inpatient  Remains inpatient appropriate because:Inpatient level of care appropriate due to severity of illness  Dispo: The patient is from: Home              Anticipated d/c is to: Home              Patient currently is not medically stable to d/c.   Difficult to place patient No     Consults, Procedures, Significant Events   Consultants:  Interventional radiology Gastroenterology  Procedures:  Outpatient ERCP attempted 7/19-unsuccessful IR biliary drain placement 7/21 Enlargement and internalization of drain 7/22   Antimicrobials:  Anti-infectives (From admission, onward)    Start     Dose/Rate Route Frequency Ordered Stop   08/17/20 0800  ciprofloxacin (CIPRO) tablet 500 mg        500 mg Oral 2 times daily 08/17/20 0411 08/22/20 0759   08/16/20 0600  cefOXitin (MEFOXIN) 2 g in sodium chloride 0.9 % 100 mL IVPB        2 g 200 mL/hr over 30 Minutes Intravenous To Radiology 08/14/20 1620 08/16/20 1704         Micro    Objective    Vitals:   08/18/20 2152 08/19/20 0523 08/19/20 0725 08/19/20 1414  BP: (!) 165/70 (!) 168/72 128/90 (!) 146/57  Pulse: (!) 110 (!) 106  (!) 107  Resp: '16 16  15  '$ Temp: 97.6 F (36.4 C) 98 F (36.7 C)  98.6 F (37 C)  TempSrc: Oral Oral  Oral  SpO2: 100% 100%  100%  Weight:      Height:        Intake/Output Summary (Last 24 hours) at 08/19/2020 1609 Last data filed at 08/19/2020 1442 Gross per 24 hour  Intake 369.86 ml  Output 600 ml  Net -230.14 ml   Filed Weights   08/08/20 1138 08/14/20 1203  Weight: 72.6 kg 68 kg    Physical Exam:  General exam: awake, alert, no acute distress HEENT: icteric sclera, moist mucus membranes, hearing grossly normal  Respiratory system: normal respiratory effort, on room a ir. Cardiovascular system: normal S1/S2, RRR  Gastrointestinal system: soft, non-tender, biliary drain in place with clean dry dressing, dark fluid in drain Central nervous system:no gross focal neurologic deficits, normal speech  Labs   Data Reviewed: I have personally reviewed following labs and imaging studies  CBC: Recent Labs  Lab 08/14/20 1934 08/15/20 0452 08/16/20 1059 08/17/20 0519 08/18/20 0541 08/19/20 0554  WBC 5.7 10.0 10.4 11.7* 13.6* 16.3*  NEUTROABS 5.0  --   --   --   --   --   HGB 13.1 12.7* 11.4* 11.7* 9.4* 11.4*  HCT 37.6* 36.0* 33.4* 33.7* 26.4* 33.6*  MCV 80.9 79.6* 82.1 81.6 82.0 85.5  PLT 264 274 278 276 271 [id number]   Basic Metabolic Panel: Recent Labs  Lab 08/15/20 0452 08/16/20 0910 08/17/20 0519 08/18/20 0541 08/19/20 0554  NA 144 149* 156* 159* 157*  K 4.6 4.3 3.7 3.5 3.5  CL 100 107 116* 119* 120*  CO2 '25 25 25 27 28  '$ GLUCOSE 127* 150* 194* 200* 268*  BUN 25* 24* 28* 25* 29*  CREATININE 0.95 0.73 0.59* 0.76 0.83  CALCIUM 9.8 10.2 10.2 10.2 9.9   GFR: Estimated Creatinine Clearance: 84.2 mL/min (by C-G formula based on SCr of 0.83 mg/dL). Liver Function Tests: Recent Labs  Lab 08/15/20 0452 08/16/20 0910  08/17/20 0519 08/18/20 0541 08/19/20 0554  AST 187* 197* 287* 233* 166*  ALT 130* 153* 213* 212* 188*  ALKPHOS 171* 170* 179* 170* 160*  BILITOT 19.1* 23.7* 26.8* 23.1* 20.4*  PROT 7.2 7.5 7.3 6.9 7.0  ALBUMIN 3.0* 3.1* 3.1* 2.8* 3.0*   Recent Labs  Lab 08/16/20 0910  LIPASE 83*   No results for input(s): AMMONIA in the last 168 hours. Coagulation Profile: Recent Labs  Lab 08/14/20 1934 08/15/20 0452 08/16/20 0553  INR 1.7* 1.5* 1.1   Cardiac Enzymes: No results for input(s): CKTOTAL, CKMB, CKMBINDEX, TROPONINI in the  last 168 hours. BNP (last 3 results) No results for input(s): PROBNP in the last 8760 hours. HbA1C: No results for input(s): HGBA1C in the last 72 hours. CBG: Recent Labs  Lab 08/18/20 1406 08/18/20 1725 08/18/20 2155 08/19/20 0738 08/19/20 1255  GLUCAP 201* 187* 217* 266* 226*   Lipid Profile: No results for input(s): CHOL, HDL, LDLCALC, TRIG, CHOLHDL, LDLDIRECT in the last 72 hours. Thyroid Function Tests: No results for input(s): TSH, T4TOTAL, FREET4, T3FREE, THYROIDAB in the last 72 hours. Anemia Panel: No results for input(s): VITAMINB12, FOLATE, FERRITIN, TIBC, IRON, RETICCTPCT in the last 72 hours. Sepsis Labs: No results for input(s): PROCALCITON, LATICACIDVEN in the last 168 hours.  Recent Results (from the past 240 hour(s))  SARS CORONAVIRUS 2 (TAT 6-24 HRS) Nasopharyngeal Nasopharyngeal Swab     Status: None   Collection Time: 08/14/20  6:04 PM   Specimen: Nasopharyngeal Swab  Result Value Ref Range Status   SARS Coronavirus 2 NEGATIVE NEGATIVE Final    Comment: (NOTE) SARS-CoV-2 target nucleic acids are NOT DETECTED.  The SARS-CoV-2 RNA is generally detectable in upper and lower respiratory specimens during the acute phase of infection. Negative results do not preclude SARS-CoV-2 infection, do not rule out co-infections with other pathogens, and should not be used as the sole basis for treatment or other patient management  decisions. Negative results must be combined with clinical observations, patient history, and epidemiological information. The expected result is Negative.  Fact Sheet for Patients: SugarRoll.be  Fact Sheet for Healthcare Providers: https://www.woods-mathews.com/  This test is not yet approved or cleared by the [place] FDA and  has been authorized for detection and/or diagnosis of SARS-CoV-2 by FDA under an Emergency Use Authorization (EUA). This EUA will remain  in effect (meaning this test can be used) for the duration of the COVID-19 declaration under Se ction 564(b)(1) of the Act, 21 U.S.C. section 360bbb-3(b)(1), unless the authorization is terminated or revoked sooner.  Performed at [facility], [address] [place], [place] [address]       Imaging Studies   IR CONVERT BILIARY DRAIN TO INT EXT BILIARY DRAIN  Result Date: 08/17/2020 Criselda Peaches, MD     08/17/2020  4:56 PM Interventional Radiology Procedure Note Procedure: Successful upsize and internalization to a 36F int/ext biliary drain. Complications: None Estimated Blood Loss: None Recommendations: - Drain to bag until bilirubin improves - Flush drain Q shift Signed, Criselda Peaches, MD     Medications   Scheduled Meds:  amLODipine  5 mg Oral Daily   ciprofloxacin  500 mg Oral BID   feeding supplement  1 Container Oral TID BM   ferrous sulfate  325 mg Oral Q breakfast   insulin aspart  0-6 Units Subcutaneous TID WC   insulin detemir  4 Units Subcutaneous Daily   pantoprazole  40 mg Intravenous Q12H   rosuvastatin  5 mg Oral Weekly   sodium chloride flush  5 mL Intracatheter Q8H   sodium chloride flush  5 mL Intracatheter Q8H   Continuous Infusions:  dextrose 100 mL/hr at 08/19/20 1312       LOS: 4 days    Time spent: 25 minutes with > 50% spent at bedside and in coordination of care    Ezekiel Slocumb, DO [facility]  08/19/2020, 4:09 PM      If 7PM-7AM, please contact night-coverage. How to contact the [facility] Attending or Consulting provider [place] or covering provider during after hours  7P -7A, for this patient?    Check the care team in [facility] and look for a) attending/consulting [facility] provider listed and b) the [facility] team listed Log into [email or web address] and use [place]'s universal password to access. If you do not have the password, please contact the hospital operator. Locate the [facility] provider you are looking for under [facility] and page to a number that you can be directly reached. If you still have difficulty reaching the provider, please page the [facility] (Director on Call) for the Hospitalists listed on amion for assistance.

## 2020-08-19 NOTE — Progress Notes (Signed)
Chart check and spoke with RN.   Patient has appetite but unable to tolerate po intake.   Biliary drain site looks clean and dry, RN reports flushing well.  VS stable CBC shows WBC trending up. Pt afebrile. - on cipro  T bili trending down, LFT improving.  OP from the drain 150 cc last night.   Will resume care Monday. Please call IR for questions and concerns.    [name] Tamikia Chowning PA-C 08/19/2020 1:39 PM

## 2020-08-19 NOTE — Evaluation (Signed)
Physical Therapy Evaluation Patient Details Name: Jeff Jordan MRN: [id number] DOB: 17-Jun-1955 Today's Date: 08/19/2020   History of Present Illness  Pt admitted with bilary obstruction 2*pancreatic adenocarcinoma and now s/p bilary drain placement.  Pt with hx of DM.  Clinical Impression  Pt admitted as above and presenting with functional mobility limitations 2* post op pain, decreased endurance, ambulatory balance loss and ongoing intermittent confusion.  This date pt up to ambulate in halls with RW and min assist.  Pt hopes to dc home with assist of dtr and church friends but vague on availability of 24/7.    Follow Up Recommendations Home health PT    Equipment Recommendations  Rolling walker with 5" wheels    Recommendations for Other Services OT consult     Precautions / Restrictions Precautions Precautions: Fall;Other (comment) Precaution Comments: bilary drain on R Restrictions Weight Bearing Restrictions: No      Mobility  Bed Mobility Overal bed mobility: Needs Assistance Bed Mobility: Supine to Sit     Supine to sit: Min assist     General bed mobility comments: modified roll with HOB elevated and min assist to bring trunk to upright sitting    Transfers Overall transfer level: Needs assistance Equipment used: Rolling walker (2 wheeled) Transfers: Sit to/from Stand Sit to Stand: Min assist         General transfer comment: Steady assist wtih cues for use of UEs to self assist  Ambulation/Gait Ambulation/Gait assistance: Min assist Gait Distance (Feet): 220 Feet Assistive device: Rolling walker (2 wheeled) Gait Pattern/deviations: Step-through pattern;Decreased step length - right;Decreased step length - left;Shuffle;Trunk flexed Gait velocity: decr   General Gait Details: Steady assist with cues for posture and position from RW.  Stairs            Wheelchair Mobility    Modified Rankin (Stroke Patients Only)       Balance  Overall balance assessment: Needs assistance Sitting-balance support: No upper extremity supported;Feet supported Sitting balance-Leahy Scale: Good     Standing balance support: Bilateral upper extremity supported Standing balance-Leahy Scale: Poor                               Pertinent Vitals/Pain Pain Assessment: 0-10 Pain Score: 6  Pain Location: R flank Pain Descriptors / Indicators: Sore Pain Intervention(s): Limited activity within patient's tolerance;Monitored during session    Home Living Family/patient expects to be discharged to:: Private residence Living Arrangements: Alone Available Help at Discharge: Family;Friend(s);Available 24 hours/day Type of Home: House Home Access: Level entry     Home Layout: One level Home Equipment: None Additional Comments: Pt states he will have assist of dtr and church friends upon dc from hospital.    Prior Function Level of Independence: Independent               Hand Dominance        Extremity/Trunk Assessment   Upper Extremity Assessment Upper Extremity Assessment: Overall WFL for tasks assessed    Lower Extremity Assessment Lower Extremity Assessment: Overall WFL for tasks assessed    Cervical / Trunk Assessment Cervical / Trunk Assessment: Normal  Communication   Communication: No difficulties  Cognition Arousal/Alertness: Awake/alert Behavior During Therapy: WFL for tasks assessed/performed;Flat affect Overall Cognitive Status: No family/caregiver present to determine baseline cognitive functioning  General Comments: Pt is alert and oriented to person, place, time and situation but then becomes confused and rambles.      General Comments      Exercises     Assessment/Plan    PT Assessment Patient needs continued PT services  PT Problem List Decreased activity tolerance;Decreased balance;Decreased mobility;Decreased cognition;Decreased  knowledge of use of DME;Pain;Decreased safety awareness       PT Treatment Interventions DME instruction;Gait training;Functional mobility training;Therapeutic activities;Therapeutic exercise;Balance training;Patient/family education    PT Goals (Current goals can be found in the Care Plan section)  Acute Rehab PT Goals Patient Stated Goal: Regain IND PT Goal Formulation: With patient Time For Goal Achievement: 09/02/20 Potential to Achieve Goals: Good    Frequency Min 3X/week   Barriers to discharge Decreased caregiver support Pt reports assist of dtr and church friends but vague on details    Co-evaluation               AM-PAC PT "6 Clicks" Mobility  Outcome Measure Help needed turning from your back to your side while in a flat bed without using bedrails?: A Little Help needed moving from lying on your back to sitting on the side of a flat bed without using bedrails?: A Little Help needed moving to and from a bed to a chair (including a wheelchair)?: A Little Help needed standing up from a chair using your arms (e.g., wheelchair or bedside chair)?: A Little Help needed to walk in hospital room?: A Little Help needed climbing 3-5 steps with a railing? : A Lot 6 Click Score: 17    End of Session Equipment Utilized During Treatment: Gait belt Activity Tolerance: Patient tolerated treatment well Patient left: in bed;with call bell/phone within reach;with bed alarm set Nurse Communication: Mobility status PT Visit Diagnosis: Difficulty in walking, not elsewhere classified (R26.2)    Time: 1350-1411 PT Time Calculation (min) (ACUTE ONLY): 21 min   Charges:   PT Evaluation $PT Eval Low Complexity: 1 Low          [place] Pager [phone] Office [phone]   Anniah Glick 08/19/2020, 4:25 PM

## 2020-08-20 DIAGNOSIS — R112 Nausea with vomiting, unspecified: Secondary | ICD-10-CM | POA: Diagnosis not present

## 2020-08-20 DIAGNOSIS — K831 Obstruction of bile duct: Secondary | ICD-10-CM | POA: Diagnosis not present

## 2020-08-20 DIAGNOSIS — C259 Malignant neoplasm of pancreas, unspecified: Secondary | ICD-10-CM | POA: Diagnosis not present

## 2020-08-20 LAB — COMPREHENSIVE METABOLIC PANEL
ALT: 147 U/L — ABNORMAL HIGH (ref 0–44)
AST: 118 U/L — ABNORMAL HIGH (ref 15–41)
Albumin: 2.5 g/dL — ABNORMAL LOW (ref 3.5–5.0)
Alkaline Phosphatase: 133 U/L — ABNORMAL HIGH (ref 38–126)
Anion gap: 10 (ref 5–15)
BUN: 23 mg/dL (ref 8–23)
CO2: 26 mmol/L (ref 22–32)
Calcium: 9.1 mg/dL (ref 8.9–10.3)
Chloride: 115 mmol/L — ABNORMAL HIGH (ref 98–111)
Creatinine, Ser: 0.87 mg/dL (ref 0.61–1.24)
GFR, Estimated: >60 mL/min (ref >60)
Glucose, Bld: 264 mg/dL — ABNORMAL HIGH (ref 70–99)
Potassium: 3 mmol/L — ABNORMAL LOW (ref 3.5–5.1)
Sodium: 151 mmol/L — ABNORMAL HIGH (ref 135–145)
Total Bilirubin: 16.3 mg/dL — ABNORMAL HIGH (ref 0.3–1.2)
Total Protein: 6.1 g/dL — ABNORMAL LOW (ref 6.5–8.1)

## 2020-08-20 LAB — GLUCOSE, CAPILLARY
Glucose-Capillary: 241 mg/dL — ABNORMAL HIGH (ref 70–99)
Glucose-Capillary: 218 mg/dL — ABNORMAL HIGH (ref 70–99)
Glucose-Capillary: 249 mg/dL — ABNORMAL HIGH (ref 70–99)
Glucose-Capillary: 186 mg/dL — ABNORMAL HIGH (ref 70–99)

## 2020-08-20 MED ORDER — OXYCODONE HCL 5 MG PO TABS
10.0000 mg | ORAL_TABLET | Freq: Four times a day (QID) | ORAL | Status: DC | PRN
  Administered 2020-08-21 – 2020-09-06 (×16): 10 mg via ORAL
  Filled 2020-08-20 (×16): qty 2

## 2020-08-20 MED ORDER — INSULIN ASPART 100 UNIT/ML IJ SOLN
0.0000 [IU] | Freq: Every day | INTRAMUSCULAR | Status: DC
  Administered 2020-08-23 – 2020-08-25 (×2): 2 [IU] via SUBCUTANEOUS
  Administered 2020-08-31: 4 [IU] via SUBCUTANEOUS

## 2020-08-20 MED ORDER — MORPHINE SULFATE (PF) 2 MG/ML IV SOLN
2.0000 mg | INTRAVENOUS | Status: DC | PRN
  Administered 2020-08-20 – 2020-09-01 (×5): 2 mg via INTRAVENOUS
  Filled 2020-08-20 (×5): qty 1

## 2020-08-20 MED ORDER — INSULIN DETEMIR 100 UNIT/ML ~~LOC~~ SOLN
10.0000 [IU] | Freq: Every day | SUBCUTANEOUS | Status: DC
  Administered 2020-08-21: 10 [IU] via SUBCUTANEOUS
  Filled 2020-08-20 (×2): qty 0.1

## 2020-08-20 MED ORDER — ONDANSETRON HCL 4 MG/2ML IJ SOLN
4.0000 mg | Freq: Four times a day (QID) | INTRAMUSCULAR | Status: DC
  Administered 2020-08-20 – 2020-08-22 (×8): 4 mg via INTRAVENOUS
  Filled 2020-08-20 (×8): qty 2

## 2020-08-20 MED ORDER — INSULIN DETEMIR 100 UNIT/ML ~~LOC~~ SOLN
6.0000 [IU] | SUBCUTANEOUS | Status: AC
  Administered 2020-08-20: 6 [IU] via SUBCUTANEOUS
  Filled 2020-08-20: qty 0.06

## 2020-08-20 MED ORDER — POTASSIUM CHLORIDE CRYS ER 20 MEQ PO TBCR
40.0000 meq | EXTENDED_RELEASE_TABLET | ORAL | Status: AC
Start: 2020-08-20 — End: 2020-08-20
  Administered 2020-08-20 (×2): 40 meq via ORAL
  Filled 2020-08-20 (×2): qty 2

## 2020-08-20 MED ORDER — INSULIN ASPART 100 UNIT/ML IJ SOLN
0.0000 [IU] | Freq: Three times a day (TID) | INTRAMUSCULAR | Status: DC
  Administered 2020-08-20: 3 [IU] via SUBCUTANEOUS
  Administered 2020-08-21: 2 [IU] via SUBCUTANEOUS
  Administered 2020-08-21: 1 [IU] via SUBCUTANEOUS
  Administered 2020-08-21: 5 [IU] via SUBCUTANEOUS

## 2020-08-20 NOTE — Progress Notes (Signed)
PROGRESS NOTE    Jeff Jordan  [id number] DOB: [birth year] DOA: 08/14/2020 PCP: Georganna Skeans, PA-C    Brief Narrative:  Jeff Jordan is a 65 year old male with past medical history significant for essential hypertension, type 2 diabetes mellitus and recent diagnosis of pancreatic adenocarcinoma initially was seen by gastroenterology, Dr. Rush Landmark for biliary stent placement for obstruction and underwent ERCP on 7/19 with inability to place a stent and was subsequently directly admitted to the hospitalist service for plans of IR intervention.   Assessment & Plan:   Principal Problem:   Biliary obstruction Active Problems:   Pancreatic adenocarcinoma (HCC)   Diabetes mellitus type 2 in nonobese [facility])   Essential hypertension   Non-intractable vomiting   Pancreatic adenocarcinoma complicated by biliary obstruction Patient was initially referred to gastroenterology, Dr. Rush Landmark for ERCP and stent placement on 7/19 that was unsuccessful and patient was subsequently admitted to the hospital service for IR intervention.  Patient underwent IR external biliary drain on 7/21 and conversion to internal and external biliary drain on 7/22. --Drain output 450 mL past 24 hours --AST 163>>287>>166>118 --ALT 115>213>>188>147 --Tbili 18.0>>26.8>>20.4>16.3 --pending initial oncology evaluation, referral made to [facility] oncology office --Oxycodone 10 mg p.o. every 6 hours as needed moderate pain --Morphine 2 mg IV every 2 hours as needed severe breakthrough pain --Continue to monitor drain output --CMP daily  Hypernatremia Etiology likely secondary to poor oral intake in the setting of pancreatic adenocarcinoma and biliary obstruction. --Na 142>>159>157>151 --Continue D5W at 131m/h --repeat CMP in am  Adult failure to thrive Patient continues with poor oral intake in the setting of pancreatic adenocarcinoma with biliary obstruction.  Biliary obstruction improving; although with  very little oral intake. --GI considering placement of core track tube --Continue to closely monitor intake  Leukocytosis CT chest with contrast with no consolidative process.  CT pelvis with contrast with no signs of infectious process.  Unclear etiology of leukocytosis, no infectious source identified.  Suspect possible hemoconcentration in the setting of poor oral intake and dehydration. --WBC 5.7>>11.7>13.6>16.3 --Continue Cipro and Flagyl for now --Repeat CBC in the a.m.  Type 2 diabetes mellitus Outpatient regimen includes Jardiance 25 mg p.o. daily, metformin 1000 mg p.o. twice daily, Actos 45 mg p.o. daily.  Blood sugars elevated during hospitalization due to D5W needed for hypernatremia. --Increase Lantus to 10 units obviously daily --Sensitive SSI for coverage --CBGs before every meal/at bedtime  Essential hypertension --Amlodipine 5 mg p.o. daily --Hydralazine IV as needed  HLD: Crestor 5 mg p.o. weekly  GERD: Protonix 40 mg IV every 12 hours   DVT prophylaxis: SCDs Start: 08/14/20 1624   Code Status: Full Code Family Communication: No family present at bedside this morning  Disposition Plan:  Level of care: Med-Surg Status is: Inpatient  Remains inpatient appropriate because:Unsafe d/c plan, IV treatments appropriate due to intensity of illness or inability to take PO, and Inpatient level of care appropriate due to severity of illness  Dispo: The patient is from: Home              Anticipated d/c is to: Home              Patient currently is not medically stable to d/c.   Difficult to place patient No   Consultants:  [facility] Interventional [facility]ology  Procedures:  IR biliary drain placement  Antimicrobials:  Cipro 7/22>> (End: 7/27) Flagyl 7/24>> Cefoxitin 7/21 - 7/21    Subjective: Patient seen examined at bedside, resting comfortably.  Sleeping but  easily arousable.  Patient reports fatigue.  Continues with poor oral intake.  Seen  by GI this morning.  Continues with good biliary output, LFTs and total bilirubin continues to trend down.  Remains afebrile.  No other questions or concerns at this time.  No family present at bedside this morning.  Denies headache, no chest pain, no palpitations, no shortness of breath, no cough/congestion, no fever/chills/night sweats, no nausea/vomiting/diarrhea.  No acute concerns overnight per nursing staff.   Objective: Vitals:   08/19/20 1414 08/19/20 2119 08/20/20 0525 08/20/20 1330  BP: (!) 146/57 (!) 153/58 (!) 145/68 132/61  Pulse: (!) 107 (!) 102 91 97  Resp: '15 16 20 17  '$ Temp: 98.6 F (37 C) 98.7 F (37.1 C) (!) 97.5 F (36.4 C) 97.7 F (36.5 C)  TempSrc: Oral Oral Oral Oral  SpO2: 100% 100% 100% 100%  Weight:      Height:        Intake/Output Summary (Last 24 hours) at 08/20/2020 1626 Last data filed at 08/20/2020 1332 Gross per 24 hour  Intake 1490.21 ml  Output 1300 ml  Net 190.21 ml   Filed Weights   08/08/20 1138 08/14/20 1203  Weight: 72.6 kg 68 kg    Examination:  General exam: Appears calm and comfortable, chronically ill in appearance Respiratory system: Clear to auscultation. Respiratory effort normal.  On room air Cardiovascular system: S1 & S2 heard, RRR. No JVD, murmurs, rubs, gallops or clicks. No pedal edema. Gastrointestinal system: Abdomen is nondistended, soft and nontender. No organomegaly or masses felt. Normal bowel sounds heard.  Biliary drain noted with dark bilious fluid present in bag Central nervous system: Alert and oriented. No focal neurological deficits. Extremities: Symmetric 5 x 5 power. Skin: No rashes, lesions or ulcers Psychiatry: Judgement and insight appear normal. Mood & affect appropriate.     Data Reviewed: I have personally reviewed following labs and imaging studies  CBC: Recent Labs  Lab 08/14/20 1934 08/15/20 0452 08/16/20 1059 08/17/20 0519 08/18/20 0541 08/19/20 0554  WBC 5.7 10.0 10.4 11.7* 13.6* 16.3*   NEUTROABS 5.0  --   --   --   --   --   HGB 13.1 12.7* 11.4* 11.7* 9.4* 11.4*  HCT 37.6* 36.0* 33.4* 33.7* 26.4* 33.6*  MCV 80.9 79.6* 82.1 81.6 82.0 85.5  PLT 264 274 278 276 271 [id number]   Basic Metabolic Panel: Recent Labs  Lab 08/16/20 0910 08/17/20 0519 08/18/20 0541 08/19/20 0554 08/20/20 0536  NA 149* 156* 159* 157* 151*  K 4.3 3.7 3.5 3.5 3.0*  CL 107 116* 119* 120* 115*  CO2 '25 25 27 28 26  '$ GLUCOSE 150* 194* 200* 268* 264*  BUN 24* 28* 25* 29* 23  CREATININE 0.73 0.59* 0.76 0.83 0.87  CALCIUM 10.2 10.2 10.2 9.9 9.1   GFR: Estimated Creatinine Clearance: 80.3 mL/min (by C-G formula based on SCr of 0.87 mg/dL). Liver Function Tests: Recent Labs  Lab 08/16/20 0910 08/17/20 0519 08/18/20 0541 08/19/20 0554 08/20/20 0536  AST 197* 287* 233* 166* 118*  ALT 153* 213* 212* 188* 147*  ALKPHOS 170* 179* 170* 160* 133*  BILITOT 23.7* 26.8* 23.1* 20.4* 16.3*  PROT 7.5 7.3 6.9 7.0 6.1*  ALBUMIN 3.1* 3.1* 2.8* 3.0* 2.5*   Recent Labs  Lab 08/16/20 0910  LIPASE 83*   No results for input(s): AMMONIA in the last 168 hours. Coagulation Profile: Recent Labs  Lab 08/14/20 1934 08/15/20 0452 08/16/20 0553  INR 1.7* 1.5* 1.1   Cardiac Enzymes:  No results for input(s): CKTOTAL, CKMB, CKMBINDEX, TROPONINI in the last 168 hours. BNP (last 3 results) No results for input(s): PROBNP in the last 8760 hours. HbA1C: No results for input(s): HGBA1C in the last 72 hours. CBG: Recent Labs  Lab 08/19/20 1255 08/19/20 1701 08/19/20 2124 08/20/20 0745 08/20/20 1151  GLUCAP 226* 199* 201* 241* 218*   Lipid Profile: No results for input(s): CHOL, HDL, LDLCALC, TRIG, CHOLHDL, LDLDIRECT in the last 72 hours. Thyroid Function Tests: No results for input(s): TSH, T4TOTAL, FREET4, T3FREE, THYROIDAB in the last 72 hours. Anemia Panel: No results for input(s): VITAMINB12, FOLATE, FERRITIN, TIBC, IRON, RETICCTPCT in the last 72 hours. Sepsis Labs: No results for input(s):  PROCALCITON, LATICACIDVEN in the last 168 hours.  Recent Results (from the past 240 hour(s))  SARS CORONAVIRUS 2 (TAT 6-24 HRS) Nasopharyngeal Nasopharyngeal Swab     Status: None   Collection Time: 08/14/20  6:04 PM   Specimen: Nasopharyngeal Swab  Result Value Ref Range Status   SARS Coronavirus 2 NEGATIVE NEGATIVE Final    Comment: (NOTE) SARS-CoV-2 target nucleic acids are NOT DETECTED.  The SARS-CoV-2 RNA is generally detectable in upper and lower respiratory specimens during the acute phase of infection. Negative results do not preclude SARS-CoV-2 infection, do not rule out co-infections with other pathogens, and should not be used as the sole basis for treatment or other patient management decisions. Negative results must be combined with clinical observations, patient history, and epidemiological information. The expected result is Negative.  Fact Sheet for Patients: SugarRoll.be  Fact Sheet for Healthcare Providers: https://www.woods-mathews.com/  This test is not yet approved or cleared by the [place] FDA and  has been authorized for detection and/or diagnosis of SARS-CoV-2 by FDA under an Emergency Use Authorization (EUA). This EUA will remain  in effect (meaning this test can be used) for the duration of the COVID-19 declaration under Se ction 564(b)(1) of the Act, 21 U.S.C. section 360bbb-3(b)(1), unless the authorization is terminated or revoked sooner.  Performed at [facility], [place] [address], [place], [place] [address]          Radiology Studies: No results found.      Scheduled Meds:  amLODipine  5 mg Oral Daily   ciprofloxacin  500 mg Oral BID   feeding supplement  1 Container Oral TID BM   ferrous sulfate  325 mg Oral Q breakfast   insulin aspart  0-5 Units Subcutaneous QHS   insulin aspart  0-9 Units Subcutaneous TID WC   [START ON 08/21/2020] insulin detemir  10 Units Subcutaneous Daily    ondansetron (ZOFRAN) IV  4 mg Intravenous Q6H   pantoprazole  40 mg Intravenous Q12H   rosuvastatin  5 mg Oral Weekly   sodium chloride flush  5 mL Intracatheter Q8H   sodium chloride flush  5 mL Intracatheter Q8H   Continuous Infusions:  dextrose 100 mL/hr at 08/20/20 0032   metronidazole 500 mg (08/20/20 1012)     LOS: 5 days    Time spent: 39 minutes spent on chart review, discussion with nursing staff, consultants, updating family and interview/physical exam; more than 50% of that time was spent in counseling and/or coordination of care.    Davionne Mastrangelo J [place] DO [facility] Available via Epic secure chat 7am-7pm After these hours, please refer to coverage provider listed on [email or web address] 08/20/2020, 4:26 PM

## 2020-08-20 NOTE — Evaluation (Addendum)
Occupational Therapy Evaluation Patient Details Name: Jeff Jordan MRN: [id number] DOB: 1955/09/28 Today's Date: 08/20/2020    History of Present Illness Pt admitted with bilary obstruction 2*pancreatic adenocarcinoma and now s/p bilary drain placement.  Pt with hx of DM.   Clinical Impression   Patient is a 65 year old male who was admitted for above. Patient was previously independent in ADLs and IADLs per patient report. Currently ,Patient was lying in bed at start of session in slouched positioning reporting soreness in back. Patient was educated on proper body mechanics to reduce discomfort in back. Patient verbalized understanding. Patient required min A with education on proper hand and foot placement for rolling and supine to sit on edge of bed. Patient required min A for transfers with RW with management of foley and drain. Patient was min A for don/doff bilateral socks with increased time. Patient was noted to have decreased activity tolerance, decreased standing balance, decreased endurance, increased pain/discomfort in back, impacting participation in ADLs. Patient reported that daughter would be able to assist PRN. Patient will need increased assistance at home at current level of function than PLOF of home alone. Patient would continue to benefit from skilled OT services at this time while admitted and after d/c to address noted deficits in order to improve overall safety and independence in ADLs.      Follow Up Recommendations  Home health OT    Equipment Recommendations       Recommendations for Other Services       Precautions / Restrictions Precautions Precautions: Fall;Other (comment) Precaution Comments: bilary drain on R      Mobility Bed Mobility   Bed Mobility: Supine to Sit     Supine to sit: Min assist     General bed mobility comments: modified roll with HOB elevated and min assist to bring trunk to upright sitting. patietn was educated on rolling and  using own support first prior to asking for therapist to pull patient up.    Transfers Overall transfer level: Needs assistance Equipment used: Rolling walker (2 wheeled) Transfers: Sit to/from Stand Sit to Stand: Min assist         General transfer comment: patient was educated on proper hand and foot placement for transfers with rolling walker to increase independence in transfers.    Balance Overall balance assessment: Needs assistance   Sitting balance-Leahy Scale: Good     Standing balance support: Bilateral upper extremity supported Standing balance-Leahy Scale: Poor                             ADL either performed or assessed with clinical judgement   ADL Overall ADL's : Needs assistance/impaired Eating/Feeding: Set up;Sitting   Grooming: Oral care;Wash/dry face;Set up;Sitting Grooming Details (indicate cue type and reason): sitting in recliner. patient was noted to have some build up on tounge. nurse made aware. nurse to contact MD. Upper Body Bathing: Set up;Sitting   Lower Body Bathing: Minimal assistance;Sit to/from stand   Upper Body Dressing : Set up;Sitting   Lower Body Dressing: Minimal assistance;Sit to/from stand Lower Body Dressing Details (indicate cue type and reason): patient was min A for donning/doffing gripper socks seated in recliner with seated 4 method. Toilet Transfer: Minimal assistance;RW   Toileting- Clothing Manipulation and Hygiene: Moderate assistance;Sit to/from stand       Functional mobility during ADLs: Min guard;Rolling walker General ADL Comments: patient required min A for transfer from edge fo  bed to reclienr in room with elevated surface     Vision   Vision Assessment?: No apparent visual deficits     Perception     Praxis      Pertinent Vitals/Pain Pain Score: 8  Pain Location: "all across my back" Pain Descriptors / Indicators: Restless;Other (Comment) (pain is rising up.) Pain Intervention(s):  Limited activity within patient's tolerance;Monitored during session     Hand Dominance Right   Extremity/Trunk Assessment Upper Extremity Assessment Upper Extremity Assessment: Overall WFL for tasks assessed   Lower Extremity Assessment Lower Extremity Assessment: Overall WFL for tasks assessed   Cervical / Trunk Assessment Cervical / Trunk Assessment: Normal   Communication Communication Communication: No difficulties   Cognition Arousal/Alertness: Awake/alert Behavior During Therapy: WFL for tasks assessed/performed;Flat affect Overall Cognitive Status: No family/caregiver present to determine baseline cognitive functioning                                 General Comments: patient is oriented to self, time, place and situaion.  had moments of confusion   General Comments       Exercises     Shoulder Instructions      Home Living Family/patient expects to be discharged to:: Private residence Living Arrangements: Alone Available Help at Discharge: Family;Friend(s);Available 24 hours/day Type of Home: House Home Access: Level entry     Home Layout: One level     Bathroom Shower/Tub: Tub/shower unit         Home Equipment: None   Additional Comments: patient states daughter could help PRN but would not have 24/7 support.      Prior Functioning/Environment Level of Independence: Independent                 OT Problem List: Decreased strength;Impaired balance (sitting and/or standing);Decreased cognition;Pain;Decreased activity tolerance;Decreased coordination;Decreased knowledge of use of DME or AE;Decreased knowledge of precautions      OT Treatment/Interventions: Self-care/ADL training;DME and/or AE instruction;Therapeutic activities;Balance training;Therapeutic exercise;Patient/family education    OT Goals(Current goals can be found in the care plan section) Acute Rehab OT Goals Patient Stated Goal: to get stronger OT Goal  Formulation: With patient Time For Goal Achievement: 09/03/20 Potential to Achieve Goals: Good  OT Frequency: Min 2X/week   Barriers to D/C:    patient lives at home alone with only PRN support from daughter.       Co-evaluation              AM-PAC OT "6 Clicks" Daily Activity     Outcome Measure Help from another person eating meals?: None Help from another person taking care of personal grooming?: A Little Help from another person toileting, which includes using toliet, bedpan, or urinal?: A Little Help from another person bathing (including washing, rinsing, drying)?: A Little Help from another person to put on and taking off regular upper body clothing?: A Little Help from another person to put on and taking off regular lower body clothing?: A Little 6 Click Score: 19   End of Session Equipment Utilized During Treatment: Rolling walker Nurse Communication: Other (comment) (build up on tounge.)  Activity Tolerance: Patient tolerated treatment well Patient left: in chair;with call bell/phone within reach;with chair alarm set  OT Visit Diagnosis: Muscle weakness (generalized) (M62.81);Unsteadiness on feet (R26.81)                Time: [id number] OT Time Calculation (min): 24 min Charges:  OT General Charges $OT Visit: 1 Visit OT Evaluation $OT Eval Low Complexity: 1 Low OT Treatments $Self Care/Home Management : 8-22 mins  Jackelyn Poling OTR/L, MS Acute Rehabilitation Department Office# [phone] Pager# [phone]   [place] 08/20/2020, 10:46 AM

## 2020-08-20 NOTE — Progress Notes (Addendum)
Progress Note  Chief Complaint:    biliary obstruction from pancreatic cancer, nausea / vomiting   Attending physician's note   I have taken an interval history, reviewed the chart and examined the patient. I agree with the Advanced Practitioner's note, impression and recommendations.   65 year old male with biliary obstruction, pancreatic adenocarcinoma s/p conversion to internal/external biliary drain by IR  Bilirubin is trending down  WBC count is elevated this morning compared to prior, will continue to monitor is currently on broad-spectrum antibiotics  Continues to have poor p.o. intake, will need to place core track and start enteral tube feeds to improve nutritional status Will hold off starting TPN for now given rising white count, but may need to consider in the future if he is not tolerating enteral feeding    The patient was provided an opportunity to ask questions and all were answered. The patient agreed with the plan and demonstrated an understanding of the instructions.  Jeff Jordan , MD [phone]     ASSESSMENT / PLAN:    # 65 yo male with  biliary obstruction secondary to pancreatic adenocarcinoma. He is s/p conversion to biliary drain to internal / external drain by IR  on 7/22 ( ERCP couldn't be done due to duodenal stenosis).  T bili downtrending at 16.3 from 20.4. Liver enzymes improving ( AST/ALT/ALP  118/147/133 from  166/188 and 160 respectively). There is ~ 300 ml of bilious fluid in drainage.  --am labs  # Leukocytosis, improving.  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 He is tolerating some PO but doesn't seem to be adequate. He had a "taste" of breakfast. He has boost ordered but says he isn't drinking them.  -- Having nausea and ? small volume emesis this am though he hasn't asked for an anti-emetic. Will change Zofran to scheduled dosing now.  --May need enteral  feedings in near future  # Anemia, normocytic Hgb stable at 11.4. Questionable episode of hematemesis a few days ago which could be secondary to esophagitis from vomiting.   # Hypernatremia / hypokalemia  --Management per TRH      SUBJECTIVE:   Mild right upper abdominal pain at site of biliary drain. Nauseated today, had small amount of vomiting.      OBJECTIVE:    Scheduled inpatient medications:   amLODipine  5 mg Oral Daily   ciprofloxacin  500 mg Oral BID   feeding supplement  1 Container Oral TID BM   ferrous sulfate  325 mg Oral Q breakfast   insulin aspart  0-6 Units Subcutaneous TID WC   insulin detemir  4 Units Subcutaneous Daily   pantoprazole  40 mg Intravenous Q12H   potassium chloride  40 mEq Oral Q4H   rosuvastatin  5 mg Oral Weekly   sodium chloride flush  5 mL Intracatheter Q8H   sodium chloride flush  5 mL Intracatheter Q8H   Continuous inpatient infusions:   dextrose 100 mL/hr at 08/20/20 0032   metronidazole 500 mg (08/20/20 0229)   PRN inpatient medications: hydrALAZINE, morphine injection, ondansetron (ZOFRAN) IV, oxyCODONE, promethazine  Vital signs in last 24 hours: Temp:  [97.5 F (36.4 C)-98.7 F (37.1 C)] 97.5 F (36.4 C) (07/25 0525) Pulse Rate:  [91-107] 91 (07/25 0525) Resp:  [15-20] 20 (07/25 0525) BP: (145-153)/(57-68) 145/68 (07/25 0525) SpO2:  [100 %] 100 % (07/25 0525) Last BM Date: 08/16/20  Biliary drain 300  ml    Physical Exam:  General: Alert thin male in NAD Heart:  Regular rate and rhythm. No lower extremity edema Pulmonary: Normal respiratory effort Abdomen: Soft, nondistended, nontender. Normal bowel sounds. Biliary drain with ~300 ml bilious fluid Neurologic: Alert and oriented Psych: Pleasant. Cooperative.   Filed Weights   08/08/20 1138 08/14/20 1203  Weight: 72.6 kg 68 kg    Intake/Output from previous day: 07/24 0701 - 07/25 0700 In: 1250.2 [P.O.:50; I.V.:1114; IV Piggyback:81.2] Out: 1500 [Urine:1050;  Drains:450] Intake/Output this shift: No intake/output data recorded.    Lab Results: Recent Labs    08/18/20 0541 08/19/20 0554  WBC 13.6* 16.3*  HGB 9.4* 11.4*  HCT 26.4* 33.6*  PLT 271 271   BMET Recent Labs    08/18/20 0541 08/19/20 0554 08/20/20 0536  NA 159* 157* 151*  K 3.5 3.5 3.0*  CL 119* 120* 115*  CO2 '27 28 26  '$ GLUCOSE 200* 268* 264*  BUN 25* 29* 23  CREATININE 0.76 0.83 0.87  CALCIUM 10.2 9.9 9.1   LFT Recent Labs    08/19/20 0554 08/20/20 0536  PROT 7.0 6.1*  ALBUMIN 3.0* 2.5*  AST 166* 118*  ALT 188* 147*  ALKPHOS 160* 133*  BILITOT 20.4* 16.3*  BILIDIR 12.4*  --   IBILI 8.0*  --    PT/INR No results for input(s): LABPROT, INR in the last 72 hours. Hepatitis Panel No results for input(s): HEPBSAG, HCVAB, HEPAIGM, HEPBIGM in the last 72 hours.  No results found.      Principal Problem:   Biliary obstruction Active Problems:   Pancreatic adenocarcinoma ([place])   Diabetes mellitus type 2 in nonobese [facility])   Essential hypertension   Non-intractable vomiting     LOS: 5 days   Tye Savoy ,NP 08/20/2020, 9:24 AM

## 2020-08-20 NOTE — Care Management Important Message (Signed)
Important Message  Patient Details IM Letter given to the Patient. Name: Jeff Jordan MRN: [id number] Date of Birth: August 05, 1955   Medicare Important Message Given:  Yes     Kerin Salen 08/20/2020, 10:54 AM

## 2020-08-20 NOTE — TOC Initial Note (Addendum)
Transition of Care [facility]) - Initial/Assessment Note    Patient Details  Name: Jeff Jordan MRN: [id number] Date of Birth: 02-Sep-1955  Transition of Care [facility]) CM/SW Contact:    Lynnell Catalan, RN Phone Number: 08/20/2020, 3:02 PM  Clinical Narrative:                  Spoke with pt at bedside for dc planning. Pt states he lives at home alone but has some help from his daughter. Choice offered for home health services and [facility] chosen. Referral called and orders faxed to [phone]. Will need DC summary faxed to company at dc. Pt requesting RW for home and order received. [employer] contacted for RW.  Expected Discharge Plan: [place] Barriers to Discharge: Continued Medical Work up   Patient Goals and CMS Choice Patient states their goals for this hospitalization and ongoing recovery are:: To go home CMS Medicare.gov Compare Post Acute Care list provided to:: Patient Choice offered to / list presented to : Patient  Expected Discharge Plan and Services Expected Discharge Plan: [place]   Discharge Planning Services: CM Consult Post Acute Care Choice: [place] arrangements for the past 2 months: Single Family Home                 DME Arranged: Walker rolling DME Agency: [employer] Date DME Agency Contacted: 08/20/20 Time DME Agency Contacted: 1501 Representative spoke with at DME Agency: Freda Munro HH Arranged: OT, PT, RN [place] Agency: [place] Date [place]: 08/20/20 Time [place]: 1501 Representative spoke with at [place]: Lorre Nick  Prior Living Arrangements/Services Living arrangements for the past 2 months: [place] with:: Self Patient language and need for interpreter reviewed:: Yes Do you feel safe going back to the place where you live?: Yes      Need for Family Participation in Patient Care: Yes (Comment) Care giver support system in place?:  Yes (comment)   Criminal Activity/Legal Involvement Pertinent to Current Situation/Hospitalization: No - Comment as needed  Activities of Daily Living Home Assistive Devices/Equipment: Eyeglasses ADL Screening (condition at time of admission) Patient's cognitive ability adequate to safely complete daily activities?: Yes Is the patient deaf or have difficulty hearing?: Yes Does the patient have difficulty seeing, even when wearing glasses/contacts?: Yes Does the patient have difficulty concentrating, remembering, or making decisions?: Yes Patient able to express need for assistance with ADLs?: Yes Does the patient have difficulty dressing or bathing?: No Independently performs ADLs?: Yes (appropriate for developmental age) Does the patient have difficulty walking or climbing stairs?: No Weakness of Legs: Both Weakness of Arms/Hands: Both  Permission Sought/Granted Permission sought to share information with : Facility Art therapist granted to share information with : Yes, Verbal Permission Granted     Permission granted to share info w AGENCY: [facility] home health        Emotional Assessment Appearance:: Appears stated age Attitude/Demeanor/Rapport: Engaged Affect (typically observed): Stable Orientation: : Oriented to Self, Oriented to Place, Oriented to  Time, Oriented to Situation Alcohol / Substance Use: Not Applicable Psych Involvement: No (comment)  Admission diagnosis:  Pancreatic mass [K86.89] Biliary obstruction [K83.1] Patient Active Problem List   Diagnosis Date Noted   Non-intractable vomiting    Pancreatic adenocarcinoma ([place]) 08/14/2020   Diabetes mellitus type 2 in nonobese ([place]) 08/14/2020   Essential hypertension 08/14/2020   Biliary obstruction    PCP:  Georganna Skeans,  PA-C Pharmacy:   [id number] [id number]: [phone]:  [phone]    Social Determinants of Health (SDOH) Interventions    Readmission Risk Interventions No flowsheet data found.

## 2020-08-20 NOTE — Progress Notes (Signed)
Mobility Specialist - Progress Note    08/20/20 1520  Mobility  Activity Ambulated in hall  Level of Assistance Minimal assist, patient does 75% or more  Assistive Device Front wheel walker  Distance Ambulated (ft) 110 ft  Mobility Ambulated with assistance in hallway  Mobility Response Tolerated well  Mobility performed by Mobility specialist  $Mobility charge 1 Mobility     Pt required Min A to sit EOB and stand in front of RW, but was eager to ambulate in hall. Pt walked ~110 ft in hallway using RW and contact guard from mobility specialist. Pt required extra time when ambulating and verbal cues to step inside walker. Pt did not c/o of pain or dizziness during ambulation, but did state feeling abd pain when returning to bed. Pt left in bed with call bell at side and RN informed of pt's pain.   [place] Specialist Acute Rehabilitation Services Phone: [phone] 08/20/20, 3:23 PM

## 2020-08-20 NOTE — Progress Notes (Addendum)
Referring Physician(s): Mansouraty,G  Supervising Physician: Mugweru,J  Patient Status:  [facility] - In-pt  Chief Complaint: Pancreatic cancer with malignant obstructive jaundice   Subjective: Patient still appears weak, has some occasional nausea and dry heaves; mild tenderness at biliary drain site but no leakage   Allergies: Patient has no known allergies.  Medications: Prior to Admission medications   Medication Sig Start Date End Date Taking? Authorizing Provider  amLODipine (NORVASC) 5 MG tablet Take 5 mg by mouth daily. 06/19/20  Yes [provider]  aspirin 81 MG EC tablet Take 81 mg by mouth daily.   Yes [provider]  ferrous sulfate 325 (65 FE) MG tablet Take 325 mg by mouth daily with breakfast.   Yes [provider]  JARDIANCE 25 MG TABS tablet Take 25 mg by mouth daily. 07/23/20  Yes [provider]  loperamide (IMODIUM) 2 MG capsule Take by mouth as needed for diarrhea or loose stools.   Yes [provider]  metFORMIN (GLUCOPHAGE) 1000 MG tablet Take 1,000 mg by mouth 2 (two) times daily. 07/16/20  Yes [provider]  olmesartan (BENICAR) 40 MG tablet Take 40 mg by mouth daily. 06/25/20  Yes [provider]  omeprazole (PRILOSEC) 40 MG capsule Take 40 mg by mouth every morning. 03/01/20  Yes [provider]  ondansetron (ZOFRAN) 4 MG tablet Take 1 tablet (4 mg total) by mouth every 4 (four) hours as needed for nausea. 08/02/20  Yes [place] Scrape A, NP  Oxycodone HCl 10 MG TABS Take 1 tablet (10 mg total) by mouth every 4 (four) hours as needed. Patient taking differently: Take 10 mg by mouth every 4 (four) hours as needed (pain). 08/02/20  Yes [place] Scrape A, NP  pioglitazone (ACTOS) 45 MG tablet Take 45 mg by mouth daily. 06/25/20  Yes [provider]  rosuvastatin (CRESTOR) 5 MG tablet Take 5 mg by mouth once a week. 06/25/20  Yes [provider]     Vital Signs: BP (!)  145/68 (BP Location: Right Arm)   Pulse 91   Temp (!) 97.5 F (36.4 C) (Oral)   Resp 20   Ht '5\' 10"'$  (1.778 m)   Wt 150 lb (68 kg)   SpO2 100%   BMI 21.52 kg/m   Physical Exam patient awake, answering questions okay, biliary drain intact, insertion site okay, mildly tender to palpation, output 450 cc of green bile, drain irrigated without difficulty  Imaging: IR BILIARY DRAIN PLACEMENT WITH CHOLANGIOGRAM  Result Date: 08/16/2020 INDICATION: Concern for metastatic pancreatic cancer, now with obstructive jaundice post failed attempt at internal biliary stent placement. Patient presents today for biliary drain placement. EXAM: ULTRASOUND AND FLUOROSCOPIC GUIDED PERCUTANEOUS TRANSHEPATIC CHOLANGIOGRAM AND BILIARY TUBE PLACEMENT COMPARISON:  CT abdomen pelvis-07/27/2020; chest CT-08/16/2020; ERCP-7/19/2 MEDICATIONS: Patient is currently admitted to hospital receiving intravenous antibiotics; The antibiotic was administered with an appropriate time frame prior to the initiation of the procedure CONTRAST:  50m OMNIPAQUE IOHEXOL 300 MG/ML SOLN - administered into the biliary tree. ANESTHESIA/SEDATION: Moderate (conscious) sedation was employed during this procedure. A total of Versed 2 mg and Fentanyl 200 mcg was administered intravenously. Moderate Sedation Time: 29 minutes. The patient's level of consciousness and vital signs were monitored continuously by radiology nursing throughout the procedure under my direct supervision. FLUOROSCOPY TIME:  4 minutes, 18 seconds (10000000mGy) COMPLICATIONS: None immediate. TECHNIQUE: Informed written consent was obtained from the patient after a discussion of the risks, benefits and alternatives to treatment. Questions regarding  the procedure were encouraged and answered. A timeout was performed prior to the initiation of the procedure. The right upper abdominal quadrant was prepped and draped in the usual sterile fashion, and a sterile drape was applied covering the  operative field. Maximum barrier sterile technique with sterile gowns and gloves were used for the procedure. A timeout was performed prior to the initiation of the procedure. Ultrasound scanning of the right upper abdominal quadrant was performed to delineate the anatomy and avoid transgression of the gallbladder or the pleural. A spot along the right mid axillary line was marked fluoroscopically inferior to the right costophrenic angle. After the overlying soft tissues were anesthetized with 1% Lidocaine with epinephrine, under direct ultrasound guidance, a 22 gauge needle was utilized to cannulate the peripheral aspect of a right intrahepatic biliary duct. Appropriate position was confirmed with limited contrast injection. Next, the duct was cannulated with a Nitrex wire and dilated with an Accustick set under fluoroscopic guidance. Limited cholangiograms were performed in various obliquities confirming appropriate access. Attempts were made with a 4 French angled glide catheter to advance both a Glidewire beyond the obstruction of the distal aspect of the CBD, however this ultimately proved unsuccessful in part due to the patient's procedure related discomfort. As such, over a short Amplatz wire, the track was dilated ultimately allowing placement of a 10 French standard all-purpose drainage catheter with end coiled locked within the dilated CBD. Postprocedural spot fluoroscopic images were obtained. Dressings were applied. The patient tolerated the procedure well without immediate postprocedural complication though was experiencing expected postprocedural discomfort. FINDINGS: Sonographic evaluation of the liver demonstrates moderate intrahepatic biliary ductal dilatation as was demonstrated on preceding abdominal CT. Under direct ultrasound guidance, a dilated peripheral duct within the anterior segment of the right lobe of the liver was accessed allowing placement of a 10 French standard all-purpose drainage  catheter with end coiled and locked within the dilated duodenum. Contrast injection demonstrates complete occlusion of the distal aspect of the CBD. There is minimal opacification of the cystic duct and gallbladder without definitive opacification of the left intrahepatic biliary tree. Limited attempts at traversing the distal obstruction of the CBD proved unsuccessful in part due to the patient's discomfort following drainage catheter placement. IMPRESSION: Successful placement of a 10.2 [place] all-purpose drainage catheter with end coiled and locked within the dilated CBD. PLAN: - Maintain external biliary drainage catheter to gravity bag. - Obtain daily CMPs - Once patient's bilirubin has significantly normalized (likely early next week), the patient may return for definitive cholangiogram and attempt at internalization of the drainage catheter. Note, this procedure may be performed at an outpatient basis though does require conscious sedation. Electronically Signed   By: Sandi Mariscal M.D.   On: 08/16/2020 17:10   IR CONVERT BILIARY DRAIN TO INT EXT BILIARY DRAIN  Result Date: 08/17/2020 INDICATION: 65 year old male with pancreatic cancer and malignant obstructed jaundice. He underwent placement of an external biliary drainage catheter yesterday. Unfortunately, his bilirubin continues to increase. Therefore, he presents for biliary drain evaluation and attempted internalization. EXAM: IR CONVERT BILIARY DRAIN TO INT-EXT BILARY DRAIN MEDICATIONS: None. ANESTHESIA/SEDATION: Moderate (conscious) sedation was employed during this procedure. A total of Versed 4 mg and Fentanyl 100 mcg and 1 mg Dilaudid was administered intravenously. Moderate Sedation Time: 19 minutes. The patient's level of consciousness and vital signs were monitored continuously by radiology nursing throughout the procedure under my direct supervision. FLUOROSCOPY TIME:  Fluoroscopy Time: 3 minutes 42 seconds (57 mGy). COMPLICATIONS: None  immediate. PROCEDURE: Informed written consent was obtained from the patient after a thorough discussion of the procedural risks, benefits and alternatives. All questions were addressed. Maximal Sterile Barrier Technique was utilized including caps, mask, sterile gowns, sterile gloves, sterile drape, hand hygiene and skin antiseptic. A timeout was performed prior to the initiation of the procedure. Initial contrast injection through the existing external 10 [place] biliary drainage catheter demonstrates persistent biliary ductal dilatation. Additionally, there are diffuse filling defects within the bile duct consistent with thrombus. The retention suture was cut. The existing tube was transected and removed over a road runner wire. Local anesthesia was attained by infiltration with 1% lidocaine. A 9 French sheath was shaped into a curve and advanced over the wire into the biliary tree. A 5 French angled catheter was then advanced over the wire into the distal common bile duct. Gentle probing with the hydrophilic road runner wire was performed in till the channel through the obstructed distal common bile duct was identified. The wire and catheter combo was advanced through the obstruction and into the duodenum. Contrast injection through the 5 French catheter confirms its location within the horizontal duodenum. A superstiff Amplatz wire was then advanced into the duodenum. The 5 French catheter and 9 French sheath were removed. The transhepatic tract was dilated to 12 [place]. A 12 [place] biliary drain was then advanced over the wire and formed. The locking loop is within the duodenum. The side hole spanned the common bile duct and proximal right intrahepatic ducts. The catheter was gently flushed and then secured to the skin with 0 Prolene suture. The catheter was connected to gravity bag drainage. IMPRESSION: 1. Hemobilia with thrombus in the dilated bile ducts. This is likely the primary reason for relatively poor  drainage through the prior 10 [place] biliary drainage catheter. 2. Successful internalization and placement of a larger 12 French internal/external biliary drainage catheter. PLAN: 1. Maintain drain to gravity bag drainage. Once bilirubin has significantly improved, the drain may be capped. 2. Flush drainage catheter at least once per shift to help clear thrombus from the biliary system. 3. As thrombus is cleared over the coming days, expect significantly increased biliary output and improvement in serum bilirubin. Signed, Criselda Peaches, MD, [facility] Electronically Signed   By: Jacqulynn Cadet M.D.   On: 08/17/2020 17:11    Labs:  CBC: Recent Labs    08/16/20 1059 08/17/20 0519 08/18/20 0541 08/19/20 0554  WBC 10.4 11.7* 13.6* 16.3*  HGB 11.4* 11.7* 9.4* 11.4*  HCT 33.4* 33.7* 26.4* 33.6*  PLT 278 276 271 271    COAGS: Recent Labs    08/14/20 1934 08/15/20 0452 08/16/20 0553  INR 1.7* 1.5* 1.1    BMP: Recent Labs    08/17/20 0519 08/18/20 0541 08/19/20 0554 08/20/20 0536  NA 156* 159* 157* 151*  K 3.7 3.5 3.5 3.0*  CL 116* 119* 120* 115*  CO2 '25 27 28 26  '$ GLUCOSE 194* 200* 268* 264*  BUN 28* 25* 29* 23  CALCIUM 10.2 10.2 9.9 9.1  CREATININE 0.59* 0.76 0.83 0.87  GFRNONAA >60 >60 >60 >60    LIVER FUNCTION TESTS: Recent Labs    08/17/20 0519 08/18/20 0541 08/19/20 0554 08/20/20 0536  BILITOT 26.8* 23.1* 20.4* 16.3*  AST 287* 233* 166* 118*  ALT 213* 212* 188* 147*  ALKPHOS 179* 170* 160* 133*  PROT 7.3 6.9 7.0 6.1*  ALBUMIN 3.1* 2.8* 3.0* 2.5*    Assessment and  Plan: Patient with history of pancreatic cancer with malignant biliary obstruction, status post external biliary drain placement on 7/21 and conversion to internal and external biliary drain on 7/22.  Afebrile, drain output 450 cc green bile, potassium 3-replace, creatinine normal, total bilirubin 16.3 down from 20.4, other LFTs  also slightly improved; maintain drain to gravity bag, once bilirubin has significantly improved may attempt capping trial, continue drain irrigation; maintain adequate hydration   Electronically Signed: D. Rowe Robert, PA-C 08/20/2020, 2:02 PM   I spent a total of 15 Minutes at the the patient's bedside AND on the patient's hospital floor or unit, greater than 50% of which was counseling/coordinating care for biliary drain    Patient ID: Jeff Jordan, male   DOB: [birth year], 65 y.o.   MRN: [id number]

## 2020-08-21 ENCOUNTER — Inpatient Hospital Stay (HOSPITAL_COMMUNITY): Payer: Medicare HMO

## 2020-08-21 DIAGNOSIS — E43 Unspecified severe protein-calorie malnutrition: Secondary | ICD-10-CM | POA: Insufficient documentation

## 2020-08-21 DIAGNOSIS — K56600 Partial intestinal obstruction, unspecified as to cause: Secondary | ICD-10-CM

## 2020-08-21 DIAGNOSIS — E440 Moderate protein-calorie malnutrition: Secondary | ICD-10-CM | POA: Insufficient documentation

## 2020-08-21 DIAGNOSIS — K831 Obstruction of bile duct: Secondary | ICD-10-CM | POA: Diagnosis not present

## 2020-08-21 LAB — CBC
HCT: 30.7 % — ABNORMAL LOW (ref 39.0–52.0)
Hemoglobin: 10.5 g/dL — ABNORMAL LOW (ref 13.0–17.0)
MCH: 29.7 pg (ref 26.0–34.0)
MCHC: 34.2 g/dL (ref 30.0–36.0)
MCV: 87 fL (ref 80.0–100.0)
Platelets: 236 10*3/uL (ref 150–400)
RBC: 3.53 MIL/uL — ABNORMAL LOW (ref 4.22–5.81)
RDW: 24.9 % — ABNORMAL HIGH (ref 11.5–15.5)
WBC: 13.7 10*3/uL — ABNORMAL HIGH (ref 4.0–10.5)
nRBC: 0.5 % — ABNORMAL HIGH (ref 0.0–0.2)

## 2020-08-21 LAB — COMPREHENSIVE METABOLIC PANEL
ALT: 128 U/L — ABNORMAL HIGH (ref 0–44)
AST: 94 U/L — ABNORMAL HIGH (ref 15–41)
Albumin: 2.5 g/dL — ABNORMAL LOW (ref 3.5–5.0)
Alkaline Phosphatase: 128 U/L — ABNORMAL HIGH (ref 38–126)
Anion gap: 9 (ref 5–15)
BUN: 17 mg/dL (ref 8–23)
CO2: 27 mmol/L (ref 22–32)
Calcium: 9.1 mg/dL (ref 8.9–10.3)
Chloride: 110 mmol/L (ref 98–111)
Creatinine, Ser: 0.87 mg/dL (ref 0.61–1.24)
GFR, Estimated: >60 mL/min (ref >60)
Glucose, Bld: 254 mg/dL — ABNORMAL HIGH (ref 70–99)
Potassium: 3.1 mmol/L — ABNORMAL LOW (ref 3.5–5.1)
Sodium: 146 mmol/L — ABNORMAL HIGH (ref 135–145)
Total Bilirubin: 15.5 mg/dL — ABNORMAL HIGH (ref 0.3–1.2)
Total Protein: 6.2 g/dL — ABNORMAL LOW (ref 6.5–8.1)

## 2020-08-21 LAB — PHOSPHORUS
Phosphorus: 3 mg/dL (ref 2.5–4.6)
Phosphorus: 2.9 mg/dL (ref 2.5–4.6)

## 2020-08-21 LAB — GLUCOSE, CAPILLARY
Glucose-Capillary: 254 mg/dL — ABNORMAL HIGH (ref 70–99)
Glucose-Capillary: 147 mg/dL — ABNORMAL HIGH (ref 70–99)
Glucose-Capillary: 161 mg/dL — ABNORMAL HIGH (ref 70–99)
Glucose-Capillary: 143 mg/dL — ABNORMAL HIGH (ref 70–99)

## 2020-08-21 LAB — MAGNESIUM: Magnesium: 2 mg/dL (ref 1.7–2.4)

## 2020-08-21 MED ORDER — OSMOLITE 1.5 CAL PO LIQD
1000.0000 mL | ORAL | Status: DC
  Administered 2020-08-21 – 2020-09-02 (×9): 1000 mL
  Filled 2020-08-21 (×24): qty 1000

## 2020-08-21 MED ORDER — INSULIN STARTER KIT- PEN NEEDLES (ENGLISH)
1.0000 | Freq: Once | Status: AC
  Administered 2020-08-21: 1
  Filled 2020-08-21: qty 1

## 2020-08-21 MED ORDER — METRONIDAZOLE 500 MG PO TABS
500.0000 mg | ORAL_TABLET | Freq: Three times a day (TID) | ORAL | Status: DC
  Administered 2020-08-21 – 2020-08-22 (×3): 500 mg via ORAL
  Filled 2020-08-21 (×3): qty 1

## 2020-08-21 MED ORDER — FREE WATER
100.0000 mL | Status: DC
  Administered 2020-08-21 – 2020-08-23 (×8): 100 mL

## 2020-08-21 MED ORDER — PROSOURCE TF PO LIQD
45.0000 mL | Freq: Three times a day (TID) | ORAL | Status: DC
  Administered 2020-08-21 – 2020-09-06 (×41): 45 mL
  Filled 2020-08-21 (×50): qty 45

## 2020-08-21 MED ORDER — VITAL HIGH PROTEIN PO LIQD: 1000.0000 mL | ORAL | Status: DC

## 2020-08-21 MED ORDER — POTASSIUM CHLORIDE CRYS ER 20 MEQ PO TBCR
30.0000 meq | EXTENDED_RELEASE_TABLET | ORAL | Status: AC
  Administered 2020-08-21 (×3): 30 meq via ORAL
  Filled 2020-08-21 (×3): qty 1

## 2020-08-21 NOTE — Progress Notes (Signed)
PT Cancellation Note  Patient Details Name: Jeff Jordan MRN: [id number] DOB: February 22, 1955   Cancelled Treatment:     pt just had feeding tube place awaiting X ray to confirm placement.  Will attempt to see tomorrow.  Pt has been evaluated with red for HH PT and RW and did amb 220 feet last session.   Rica Koyanagi  PTA Acute  Rehabilitation Services Pager      [phone] Office      [phone]

## 2020-08-21 NOTE — Plan of Care (Signed)
Patient had abdominal pain that was treated with IV moprhine, biliary drain put out 171m, flushed per order. Patient sat in the chair for about 2 hours. Problem: Education: Goal: Knowledge of General Education information will improve Description: Including pain rating scale, medication(s)/side effects and non-pharmacologic comfort measures Outcome: Progressing   Problem: Health Behavior/Discharge Planning: Goal: Ability to manage health-related needs will improve Outcome: Progressing   Problem: Clinical Measurements: Goal: Ability to maintain clinical measurements within normal limits will improve Outcome: Progressing Goal: Will remain free from infection Outcome: Progressing Goal: Diagnostic test results will improve Outcome: Progressing Goal: Respiratory complications will improve Outcome: Progressing Goal: Cardiovascular complication will be avoided Outcome: Progressing   Problem: Activity: Goal: Risk for activity intolerance will decrease Outcome: Progressing   Problem: Nutrition: Goal: Adequate nutrition will be maintained Outcome: Progressing   Problem: Coping: Goal: Level of anxiety will decrease Outcome: Progressing   Problem: Elimination: Goal: Will not experience complications related to bowel motility Outcome: Progressing Goal: Will not experience complications related to urinary retention Outcome: Progressing   Problem: Pain Managment: Goal: General experience of comfort will improve Outcome: Progressing   Problem: Safety: Goal: Ability to remain free from injury will improve Outcome: Progressing   Problem: Skin Integrity: Goal: Risk for impaired skin integrity will decrease Outcome: Progressing

## 2020-08-21 NOTE — Progress Notes (Signed)
Referring Physician(s): Mansouraty,G  Supervising Physician: Dr. Deanne Coffer  Patient Status:  [facility] - In-pt  Chief Complaint: Pancreatic cancer with malignant obstructive jaundice   Subjective: Feels okay, tolerating some po liquids, but had small bore NGT placed to begin TF Some soreness at biliary drain site as expected.  Allergies: Patient has no known allergies.  Medications:  Current Facility-Administered Medications:    amLODipine (NORVASC) tablet 5 mg, 5 mg, Oral, Daily, Eduard Clos, MD, 5 mg at 08/21/20 1029   ciprofloxacin (CIPRO) tablet 500 mg, 500 mg, Oral, BID, Mansouraty, Netty Starring., MD, 500 mg at 08/21/20 7384   feeding supplement (BOOST / RESOURCE BREEZE) liquid 1 Container, 1 Container, Oral, TID BM, Pennie Banter, DO, 1 Container at 08/21/20 1354   feeding supplement (OSMOLITE 1.5 CAL) liquid 1,000 mL, 1,000 mL, Per Tube, Continuous, [place], Eric J, DO   feeding supplement (PROSource TF) liquid 45 mL, 45 mL, Per Tube, TID, [place], Eric J, DO   ferrous sulfate tablet 325 mg, 325 mg, Oral, Q breakfast, Eduard Clos, MD, 325 mg at 08/21/20 7168   free water 100 mL, 100 mL, Per Tube, Q4H, [place], Eric J, DO   hydrALAZINE (APRESOLINE) injection 10 mg, 10 mg, Intravenous, Q4H PRN, Eduard Clos, MD, 10 mg at 08/19/20 5342   insulin aspart (novoLOG) injection 0-5 Units, 0-5 Units, Subcutaneous, QHS, [place], Eric J, DO   insulin aspart (novoLOG) injection 0-9 Units, 0-9 Units, Subcutaneous, TID WC, [place], Alvira Philips, DO, 1 Units at 08/21/20 1329   insulin detemir (LEVEMIR) injection 10 Units, 10 Units, Subcutaneous, Daily, [place], Alvira Philips, DO, 10 Units at 08/21/20 1037   insulin starter kit- pen needles (English) 1 kit, 1 kit, Other, Once, [place], Eric J, DO   metroNIDAZOLE (FLAGYL) tablet 500 mg, 500 mg, Oral, Q8H, Rexford Maus, RPH, 500 mg at 08/21/20 1038   morphine 2 MG/ML injection 2 mg, 2 mg, Intravenous, Q2H PRN, [place], Alvira Philips, DO, 2 mg at 08/20/20 2221   ondansetron [facility]) injection 4 mg, 4 mg, Intravenous, Q6H, Meredith Pel, NP, 4 mg at 08/21/20 1353   oxyCODONE (Oxy IR/ROXICODONE) immediate release tablet 10 mg, 10 mg, Oral, Q6H PRN, [place], Alvira Philips, DO, 10 mg at 08/21/20 0615   pantoprazole (PROTONIX) injection 40 mg, 40 mg, Intravenous, Q12H, Zehr, Jessica D, PA-C, 40 mg at 08/21/20 1030   potassium chloride (KLOR-CON) CR tablet 30 mEq, 30 mEq, Oral, Q3H, [place], Alvira Philips, DO, 30 mEq at 08/21/20 1353   promethazine (PHENERGAN) tablet 12.5 mg, 12.5 mg, Oral, Q6H PRN, Cirigliano, Vito V, DO, 12.5 mg at 08/19/20 1314   rosuvastatin (CRESTOR) tablet 5 mg, 5 mg, Oral, Weekly, Eduard Clos, MD, 5 mg at 08/18/20 3740   sodium chloride flush (NS) 0.9 % injection 5 mL, 5 mL, Intracatheter, Q8H, Simonne Come, MD, 5 mL at 08/21/20 0615   sodium chloride flush (NS) 0.9 % injection 5 mL, 5 mL, Intracatheter, Q8H, Sterling Big, MD, 5 mL at 08/21/20 0615    Vital Signs: BP (!) 134/40 (BP Location: Left Arm)   Pulse 94   Temp 98.7 F (37.1 C) (Oral)   Resp 17   Ht 5\' 10"  (1.778 m)   Wt 68 kg   SpO2 100%   BMI 21.52 kg/m   Physical Exam  Awake and alert. Biliary drain intact, site clean, dry, mildly tender to palpation. output 375 cc of green bile  Imaging: DG Abd 1 View  Result Date:  08/21/2020 CLINICAL DATA:  Few feeding tube placement EXAM: ABDOMEN - 1 VIEW COMPARISON:  None. FINDINGS: Soft feeding tube enters the stomach, with its tip in the fundus. Drainage catheter present in the right upper abdomen. IMPRESSION: Soft feeding tube tip in the fundus of the stomach. Electronically Signed   By: Nelson Chimes M.D.   On: 08/21/2020 12:18   IR CONVERT BILIARY DRAIN TO INT EXT BILIARY DRAIN  Result Date: 08/17/2020 INDICATION: 65 year old male with pancreatic cancer and malignant obstructed jaundice. He underwent placement of an external biliary drainage catheter yesterday. Unfortunately, his  bilirubin continues to increase. Therefore, he presents for biliary drain evaluation and attempted internalization. EXAM: IR CONVERT BILIARY DRAIN TO INT-EXT BILARY DRAIN MEDICATIONS: None. ANESTHESIA/SEDATION: Moderate (conscious) sedation was employed during this procedure. A total of Versed 4 mg and Fentanyl 100 mcg and 1 mg Dilaudid was administered intravenously. Moderate Sedation Time: 19 minutes. The patient's level of consciousness and vital signs were monitored continuously by radiology nursing throughout the procedure under my direct supervision. FLUOROSCOPY TIME:  Fluoroscopy Time: 3 minutes 42 seconds (57 mGy). COMPLICATIONS: None immediate. PROCEDURE: Informed written consent was obtained from the patient after a thorough discussion of the procedural risks, benefits and alternatives. All questions were addressed. Maximal Sterile Barrier Technique was utilized including caps, mask, sterile gowns, sterile gloves, sterile drape, hand hygiene and skin antiseptic. A timeout was performed prior to the initiation of the procedure. Initial contrast injection through the existing external 10 [place] biliary drainage catheter demonstrates persistent biliary ductal dilatation. Additionally, there are diffuse filling defects within the bile duct consistent with thrombus. The retention suture was cut. The existing tube was transected and removed over a road runner wire. Local anesthesia was attained by infiltration with 1% lidocaine. A 9 French sheath was shaped into a curve and advanced over the wire into the biliary tree. A 5 French angled catheter was then advanced over the wire into the distal common bile duct. Gentle probing with the hydrophilic road runner wire was performed in till the channel through the obstructed distal common bile duct was identified. The wire and catheter combo was advanced through the obstruction and into the duodenum. Contrast injection through the 5 French catheter confirms its  location within the horizontal duodenum. A superstiff Amplatz wire was then advanced into the duodenum. The 5 French catheter and 9 French sheath were removed. The transhepatic tract was dilated to 12 [place]. A 12 [place] biliary drain was then advanced over the wire and formed. The locking loop is within the duodenum. The side hole spanned the common bile duct and proximal right intrahepatic ducts. The catheter was gently flushed and then secured to the skin with 0 Prolene suture. The catheter was connected to gravity bag drainage. IMPRESSION: 1. Hemobilia with thrombus in the dilated bile ducts. This is likely the primary reason for relatively poor drainage through the prior 10 [place] biliary drainage catheter. 2. Successful internalization and placement of a larger 12 French internal/external biliary drainage catheter. PLAN: 1. Maintain drain to gravity bag drainage. Once bilirubin has significantly improved, the drain may be capped. 2. Flush drainage catheter at least once per shift to help clear thrombus from the biliary system. 3. As thrombus is cleared over the coming days, expect significantly increased biliary output and improvement in serum bilirubin. Signed, Criselda Peaches, MD, [facility] Electronically Signed   By: Jacqulynn Cadet M.D.   On: 08/17/2020 17:11    Labs:  CBC: Recent Labs    08/17/20 0519 08/18/20 0541 08/19/20 0554 08/21/20 0554  WBC 11.7* 13.6* 16.3* 13.7*  HGB 11.7* 9.4* 11.4* 10.5*  HCT 33.7* 26.4* 33.6* 30.7*  PLT 276 271 271 236     COAGS: Recent Labs    08/14/20 1934 08/15/20 0452 08/16/20 0553  INR 1.7* 1.5* 1.1     BMP: Recent Labs    08/18/20 0541 08/19/20 0554 08/20/20 0536 08/21/20 0554  NA 159* 157* 151* 146*  K 3.5 3.5 3.0* 3.1*  CL 119* 120* 115* 110  CO2 $Re'27 28 26 27  'cur$ GLUCOSE 200* 268* 264* 254*  BUN 25* 29* 23 17  CALCIUM 10.2 9.9 9.1 9.1  CREATININE 0.76 0.83 0.87  0.87  GFRNONAA >60 >60 >60 >60     LIVER FUNCTION TESTS: Recent Labs    08/18/20 0541 08/19/20 0554 08/20/20 0536 08/21/20 0554  BILITOT 23.1* 20.4* 16.3* 15.5*  AST 233* 166* 118* 94*  ALT 212* 188* 147* 128*  ALKPHOS 170* 160* 133* 128*  PROT 6.9 7.0 6.1* 6.2*  ALBUMIN 2.8* 3.0* 2.5* 2.5*     Assessment and Plan: Patient with history of pancreatic cancer with malignant biliary obstruction, status post external biliary drain placement on 7/21 and conversion to internal and external biliary drain on 7/22.   Drain functional with steady output T. Bili cont downward trend, 15.5 today IR cont to follow.   Electronically Signed: Ascencion Dike, PA-C 08/21/2020, 2:59 PM   I spent a total of 15 Minutes at the the patient's bedside AND on the patient's hospital floor or unit, greater than 50% of which was counseling/coordinating care for biliary drain

## 2020-08-21 NOTE — Progress Notes (Signed)
PROGRESS NOTE    Jeff Jordan  XNA:[id number] DOB: 1955/06/18 DOA: 08/14/2020 PCP: Georganna Skeans, PA-C    Brief Narrative:  Jeff Jordan is a 65 year old male with past medical history significant for essential hypertension, type 2 diabetes mellitus and recent diagnosis of pancreatic adenocarcinoma initially was seen by gastroenterology, Dr. Rush Landmark for biliary stent placement for obstruction and underwent ERCP on 7/19 with inability to place a stent and was subsequently directly admitted to the hospitalist service for plans of IR intervention.   Assessment & Plan:   Principal Problem:   Biliary obstruction Active Problems:   Pancreatic adenocarcinoma (HCC)   Diabetes mellitus type 2 in nonobese [facility])   Essential hypertension   Non-intractable vomiting   Pancreatic adenocarcinoma complicated by biliary obstruction Patient was initially referred to gastroenterology, Dr. Rush Landmark for ERCP and stent placement on 7/19 that was unsuccessful and patient was subsequently admitted to the hospital service for IR intervention.  Patient underwent IR external biliary drain on 7/21 and conversion to internal and external biliary drain on 7/22. --Drain output 450 mL past 24 hours --AST 163>>287>>166>118>94 --ALT 115>213>>188>147>128 --Tbili 18.0>>26.8>>20.4>16.3>15.5 --pending initial oncology evaluation, referral made to [facility] oncology office --Oxycodone 10 mg p.o. every 6 hours as needed moderate pain --Morphine 2 mg IV every 2 hours as needed severe breakthrough pain --Continue to monitor drain output; 326m out past 24h --CMP daily  Hypernatremia Etiology likely secondary to poor oral intake in the setting of pancreatic adenocarcinoma and biliary obstruction. --Na 142>>159>157>151>146 --Discontinue D5W today --Started on tube feeds through --repeat CMP in am  Adult failure to thrive Patient continues with poor oral intake in the setting of pancreatic adenocarcinoma with  biliary obstruction.  Biliary obstruction improving; although with very little oral intake and nausea/vomiting with solid food. --GI ordered small bore feeding tube today --RD for initiation of tube feeds --Continue to closely monitor intake  Leukocytosis CT chest with contrast with no consolidative process.  CT pelvis with contrast with no signs of infectious process.  Unclear etiology of leukocytosis, no infectious source identified.  Suspect possible hemoconcentration in the setting of poor oral intake and dehydration. --WBC 5.7>>11.7>13.6>16.3>13.7 --Continue Cipro and Flagyl for now --Repeat CBC in the a.m.  Type 2 diabetes mellitus Outpatient regimen includes Jardiance 25 mg p.o. daily, metformin 1000 mg p.o. twice daily, Actos 45 mg p.o. daily.  Blood sugars elevated during hospitalization due to D5W needed for hypernatremia; but now will DC today. --Lantus to 10 units [place] daily --Sensitive SSI for coverage --CBGs before every meal/at bedtime  Essential hypertension --Amlodipine 5 mg p.o. daily --Hydralazine IV as needed  HLD: Crestor 5 mg p.o. weekly  GERD: Protonix 40 mg IV every 12 hours   DVT prophylaxis: SCDs Start: 08/14/20 1624   Code Status: Full Code Family Communication: No family present at bedside this morning  Disposition Plan:  Level of care: Med-Surg Status is: Inpatient  Remains inpatient appropriate because:Unsafe d/c plan, IV treatments appropriate due to intensity of illness or inability to take PO, and Inpatient level of care appropriate due to severity of illness  Dispo: The patient is from: Home              Anticipated d/c is to: Home              Patient currently is not medically stable to d/c.   Difficult to place patient No   Consultants:  [facility]  Procedures:  IR biliary drain placement Small bore feeding tube  placement, 7/26  Antimicrobials:  Cipro 7/22>> (End: 7/27) Flagyl  7/24>> Cefoxitin 7/21 - 7/21    Subjective: Patient seen examined at bedside, resting comfortably.  Sleeping but easily arousable.  Patient reports fatigue and continued nausea/vomiting with solids.  Continues with poor oral intake.  Seen by GI this morning with placement of a small bore feeding tube to initiate tube feeds.  Continues with good biliary output, LFTs and total bilirubin continues to trend down.  Remains afebrile.  No other questions or concerns at this time.  No family present at bedside this morning.  Denies headache, no chest pain, no palpitations, no shortness of breath, no cough/congestion, no fever/chills/night sweats, no nausea/vomiting/diarrhea.  No acute concerns overnight per nursing staff.   Objective: Vitals:   08/20/20 0525 08/20/20 1330 08/20/20 2135 08/21/20 0453  BP: (!) 145/68 132/61 (!) 140/59 (!) 145/57  Pulse: 91 97 89 86  Resp: _0 Temp: (!) 97.5 F (36.4 C) 97.7 F (36.5 C) 99.2 F (37.3 C) 98.9 F (37.2 C)  TempSrc: Oral Oral Oral Oral  SpO2: 100% 100% 100% 100%  Weight:      Height:        Intake/Output Summary (Last 24 hours) at 08/21/2020 1250 Last data filed at 08/21/2020 0500 Gross per 24 hour  Intake 538.8 ml  Output 1050 ml  Net -511.2 ml   Filed Weights   08/08/20 1138 08/14/20 1203  Weight: 72.6 kg 68 kg    Examination:  General exam: Appears calm and comfortable, chronically ill in appearance, scleral icterus Respiratory system: Clear to auscultation. Respiratory effort normal.  On room air Cardiovascular system: S1 & S2 heard, RRR. No JVD, murmurs, rubs, gallops or clicks. No pedal edema. Gastrointestinal system: Abdomen is nondistended, soft and nontender. No organomegaly or masses felt. Normal bowel sounds heard.  Biliary drain noted with dark bilious fluid present in bag Central nervous system: Alert and oriented. No focal neurological deficits. Extremities: Symmetric 5 x 5 power. Skin: Jaundice noted, otherwise  no rashes, lesions or ulcers Psychiatry: Judgement and insight appear normal. Mood & affect appropriate.     Data Reviewed: I have personally reviewed following labs and imaging studies  CBC: Recent Labs  Lab 08/14/20 1934 08/15/20 0452 08/16/20 1059 08/17/20 0519 08/18/20 0541 08/19/20 0554 08/21/20 0554  WBC 5.7   < > 10.4 11.7* 13.6* 16.3* 13.7*  NEUTROABS 5.0  --   --   --   --   --   --   HGB 13.1   < > 11.4* 11.7* 9.4* 11.4* 10.5*  HCT 37.6*   < > 33.4* 33.7* 26.4* 33.6* 30.7*  MCV 80.9   < > 82.1 81.6 82.0 85.5 87.0  PLT 264   < > 278 276 271 271 236   < > = values in this interval not displayed.   Basic Metabolic Panel: Recent Labs  Lab 08/17/20 0519 08/18/20 0541 08/19/20 0554 08/20/20 0536 08/21/20 0554  NA 156* 159* 157* 151* 146*  K 3.7 3.5 3.5 3.0* 3.1*  CL 116* 119* 120* 115* 110  CO2 _1 GLUCOSE 194* 200* 268* 264* 254*  BUN 28* 25* 29* 23 17  CREATININE 0.59* 0.76 0.83 0.87 0.87  CALCIUM 10.2 10.2 9.9 9.1 9.1  MG  --   --   --   --  2.0   GFR: Estimated Creatinine Clearance: 80.3 mL/min (by C-G formula based on SCr of 0.87 mg/dL). Liver Function Tests:  Recent Labs  Lab 08/17/20 0519 08/18/20 0541 08/19/20 0554 08/20/20 0536 08/21/20 0554  AST 287* 233* 166* 118* 94*  ALT 213* 212* 188* 147* 128*  ALKPHOS 179* 170* 160* 133* 128*  BILITOT 26.8* 23.1* 20.4* 16.3* 15.5*  PROT 7.3 6.9 7.0 6.1* 6.2*  ALBUMIN 3.1* 2.8* 3.0* 2.5* 2.5*   Recent Labs  Lab 08/16/20 0910  LIPASE 83*   No results for input(s): AMMONIA in the last 168 hours. Coagulation Profile: Recent Labs  Lab 08/14/20 1934 08/15/20 0452 08/16/20 0553  INR 1.7* 1.5* 1.1   Cardiac Enzymes: No results for input(s): CKTOTAL, CKMB, CKMBINDEX, TROPONINI in the last 168 hours. BNP (last 3 results) No results for input(s): PROBNP in the last 8760 hours. HbA1C: No results for input(s): HGBA1C in the last 72 hours. CBG: Recent Labs  Lab 08/20/20 1151  08/20/20 1721 08/20/20 2137 08/21/20 0725 08/21/20 1142  GLUCAP 218* 249* 186* 254* 147*   Lipid Profile: No results for input(s): CHOL, HDL, LDLCALC, TRIG, CHOLHDL, LDLDIRECT in the last 72 hours. Thyroid Function Tests: No results for input(s): TSH, T4TOTAL, FREET4, T3FREE, THYROIDAB in the last 72 hours. Anemia Panel: No results for input(s): VITAMINB12, FOLATE, FERRITIN, TIBC, IRON, RETICCTPCT in the last 72 hours. Sepsis Labs: No results for input(s): PROCALCITON, LATICACIDVEN in the last 168 hours.  Recent Results (from the past 240 hour(s))  SARS CORONAVIRUS 2 (TAT 6-24 HRS) Nasopharyngeal Nasopharyngeal Swab     Status: None   Collection Time: 08/14/20  6:04 PM   Specimen: Nasopharyngeal Swab  Result Value Ref Range Status   SARS Coronavirus 2 NEGATIVE NEGATIVE Final    Comment: (NOTE) SARS-CoV-2 target nucleic acids are NOT DETECTED.  The SARS-CoV-2 RNA is generally detectable in upper and lower respiratory specimens during the acute phase of infection. Negative results do not preclude SARS-CoV-2 infection, do not rule out co-infections with other pathogens, and should not be used as the sole basis for treatment or other patient management decisions. Negative results must be combined with clinical observations, patient history, and epidemiological information. The expected result is Negative.  Fact Sheet for Patients: SugarRoll.be  Fact Sheet for Healthcare Providers: https://www.woods-mathews.com/  This test is not yet approved or cleared by the [place] FDA and  has been authorized for detection and/or diagnosis of SARS-CoV-2 by FDA under an Emergency Use Authorization (EUA). This EUA will remain  in effect (meaning this test can be used) for the duration of the COVID-19 declaration under Se ction 564(b)(1) of the Act, 21 U.S.C. section 360bbb-3(b)(1), unless the authorization is terminated or revoked  sooner.  Performed at [facility], [place] [address], [place], [place] [address]          Radiology Studies: DG Abd 1 View  Result Date: 08/21/2020 CLINICAL DATA:  Few feeding tube placement EXAM: ABDOMEN - 1 VIEW COMPARISON:  None. FINDINGS: Soft feeding tube enters the stomach, with its tip in the fundus. Drainage catheter present in the right upper abdomen. IMPRESSION: Soft feeding tube tip in the fundus of the stomach. Electronically Signed   By: Nelson Chimes M.D.   On: 08/21/2020 12:18        Scheduled Meds:  amLODipine  5 mg Oral Daily   ciprofloxacin  500 mg Oral BID   feeding supplement  1 Container Oral TID BM   ferrous sulfate  325 mg Oral Q breakfast   insulin aspart  0-5 Units Subcutaneous QHS   insulin aspart  0-9 Units Subcutaneous TID  WC   insulin detemir  10 Units Subcutaneous Daily   insulin starter kit- pen needles  1 kit Other Once   metroNIDAZOLE  500 mg Oral Q8H   ondansetron (ZOFRAN) IV  4 mg Intravenous Q6H   pantoprazole  40 mg Intravenous Q12H   potassium chloride  30 mEq Oral Q3H   rosuvastatin  5 mg Oral Weekly   sodium chloride flush  5 mL Intracatheter Q8H   sodium chloride flush  5 mL Intracatheter Q8H   Continuous Infusions:     LOS: 6 days    Time spent: 39 minutes spent on chart review, discussion with nursing staff, consultants, updating family and interview/physical exam; more than 50% of that time was spent in counseling and/or coordination of care.    Kenny Rea J [place] DO [facility] Available via Epic secure chat 7am-7pm After these hours, please refer to coverage provider listed on [email or web address] 08/21/2020, 12:50 PM

## 2020-08-21 NOTE — Progress Notes (Signed)
PHARMACIST - PHYSICIAN COMMUNICATION DR:   [place] CONCERNING: Antibiotic IV to Oral Route Change Policy  RECOMMENDATION: This patient is receiving metronidazole by the intravenous route.  Based on criteria approved by the Pharmacy and Therapeutics Committee, the antibiotic(s) is/are being converted to the equivalent oral dose form(s).   DESCRIPTION: These criteria include: Patient being treated for a respiratory tract infection, urinary tract infection, cellulitis or clostridium difficile associated diarrhea if on metronidazole The patient is not neutropenic and does not exhibit a GI malabsorption state The patient is eating (either orally or via tube) and/or has been taking other orally administered medications for a least 24 hours The patient is improving clinically and has a Tmax < 100.5  If you have questions about this conversion, please contact the Pharmacy Department  '[]'$   [phone] )  [facility] '[]'$   [phone] )  [facility]  '[]'$   [phone] )  [facility] '[x]'$   [phone] )  [place], PharmD 08/21/2020 10:02 AM

## 2020-08-21 NOTE — Progress Notes (Signed)
Progress Note  Chief Complaint:    biliary obstruction from pancreatic cancer , nausea / vomiting     ASSESSMENT / PLAN:    # 65 yo male with  biliary obstruction secondary to recently diagnosed pancreatic adenocarcinoma. He is s/p conversion of biliary drain to internal / external drain by IR on 7/22 ( ERCP couldn't be done due to duodenal stenosis).  T bili downtrending at 15.5, from 16.2 yesterday.  Liver enzymes improving ( AST/ALT/ALP  94  / 129 / 128.  He has established care with Oncology and sounds like plan is for eventually chemotherapy --Liver tests continue to improve --IR may try and cap drain soon.   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 He is tolerating some liquids but solids tend to cause nausea and vomiting.   --Plan was for Core track placement to start enteral feeding. Core Track team doesn't come to [place] but I spoke with ICU Charge nurse who will attempt a small bowel feeding tube. Following that I will ask RD to start enteral feedings.     # Anemia, normocytic. Overall hgb is stable at 10.5.  Questionable episode of hematemesis a few days ago which could be secondary to esophagitis from vomiting.  --Since he has continued to vomit will continue BID IV PPI for now --Continue Zofran IV Q 6 hours for now.   # Hypernatremia, improving.        SUBJECTIVE:   Able to tolerate limited portions of fluids including nutritional drinks but vomits solid food.     OBJECTIVE:    Scheduled inpatient medications:   amLODipine  5 mg Oral Daily   ciprofloxacin  500 mg Oral BID   feeding supplement  1 Container Oral TID BM   ferrous sulfate  325 mg Oral Q breakfast   insulin aspart  0-5 Units Subcutaneous QHS   insulin aspart  0-9 Units Subcutaneous TID WC   insulin detemir  10 Units Subcutaneous Daily   metroNIDAZOLE  500 mg Oral Q8H   ondansetron  (ZOFRAN) IV  4 mg Intravenous Q6H   pantoprazole  40 mg Intravenous Q12H   potassium chloride  30 mEq Oral Q3H   rosuvastatin  5 mg Oral Weekly   sodium chloride flush  5 mL Intracatheter Q8H   sodium chloride flush  5 mL Intracatheter Q8H   Continuous inpatient infusions:  PRN inpatient medications: hydrALAZINE, morphine injection, oxyCODONE, promethazine  Vital signs in last 24 hours: Temp:  [97.7 F (36.5 C)-99.2 F (37.3 C)] 98.9 F (37.2 C) (07/26 0453) Pulse Rate:  [86-97] 86 (07/26 0453) Resp:  [17] 17 (07/26 0453) BP: (132-145)/(57-61) 145/57 (07/26 0453) SpO2:  [100 %] 100 % (07/26 0453) Last BM Date: 08/16/20  Intake/Output Summary (Last 24 hours) at 08/21/2020 1027 Last data filed at 08/21/2020 0500 Gross per 24 hour  Intake 538.8 ml  Output 1050 ml  Net -511.2 ml     Physical Exam:  General: Alert thin male in NAD Heart:  Regular rate and rhythm. No lower extremity edema Pulmonary: Normal respiratory effort Abdomen: Soft, nondistended, nontender. Normal bowel sounds.  Neurologic: Alert and oriented Psych: Pleasant. Cooperative.   Filed Weights   08/08/20 1138 08/14/20 1203  Weight: 72.6 kg 68 kg    Intake/Output from previous day: 07/25 0701 - 07/26 0700 In: 658.8 [P.O.:240; IV Piggyback:418.8] Out: 1050 [Urine:675; Drains:375] Intake/Output this  shift: No intake/output data recorded.    Lab Results: Recent Labs    08/19/20 0554 08/21/20 0554  WBC 16.3* 13.7*  HGB 11.4* 10.5*  HCT 33.6* 30.7*  PLT 271 236   BMET Recent Labs    08/19/20 0554 08/20/20 0536 08/21/20 0554  NA 157* 151* 146*  K 3.5 3.0* 3.1*  CL 120* 115* 110  CO2 '28 26 27  '$ GLUCOSE 268* 264* 254*  BUN 29* 23 17  CREATININE 0.83 0.87 0.87  CALCIUM 9.9 9.1 9.1   LFT Recent Labs    08/19/20 0554 08/20/20 0536 08/21/20 0554  PROT 7.0   < > 6.2*  ALBUMIN 3.0*   < > 2.5*  AST 166*   < > 94*  ALT 188*   < > 128*  ALKPHOS 160*   < > 128*  BILITOT 20.4*   < > 15.5*   BILIDIR 12.4*  --   --   IBILI 8.0*  --   --    < > = values in this interval not displayed.   PT/INR No results for input(s): LABPROT, INR in the last 72 hours. Hepatitis Panel No results for input(s): HEPBSAG, HCVAB, HEPAIGM, HEPBIGM in the last 72 hours.  No results found.      Principal Problem:   Biliary obstruction Active Problems:   Pancreatic adenocarcinoma ([place])   Diabetes mellitus type 2 in nonobese [facility])   Essential hypertension   Non-intractable vomiting     LOS: 6 days   Tye Savoy ,NP 08/21/2020, 10:27 AM

## 2020-08-21 NOTE — Progress Notes (Signed)
Nutrition Follow-up  DOCUMENTATION CODES:   Non-severe (moderate) malnutrition in context of chronic illness  INTERVENTION:  - will order TF regimen: Osmolite 1.5 @ 25 ml/hr to advance by 10 ml every 12 hours to reach goal rate of 55 ml/hr with 45 ml Prosource TF TID and 100 ml free water every 4 hours.  - at goal rate, this regimen will provide 2100 kcal, 116 grams protein, and 1606 ml free water.   Monitor magnesium, potassium, and phosphorus daily for at least 3 days, MD to replete as needed, as pt is at risk for refeeding syndrome give moderate malnutrition, inadequate nutrition for at least 1 week.   NUTRITION DIAGNOSIS:   Moderate Malnutrition related to chronic illness, cancer and cancer related treatments as evidenced by mild fat depletion, mild muscle depletion, moderate muscle depletion. -revised, ongoing  GOAL:   Patient will meet greater than or equal to 90% of their needs -unable to meet at this time  MONITOR:   TF tolerance, Diet advancement, PO intake, Labs, Weight trends  REASON FOR ASSESSMENT:   Consult Enteral/tube feeding initiation and management  ASSESSMENT:   65 year old male with past medical history of hypertension and diabetes mellitus with recent diagnosis of pancreatic mass suspected to be pancreatic adenocarcinoma referred to Dr. Allison Jordan of gastroenterology for stent placement for obstruction and underwent ERCP on 7/19 with stent unable to be placed.  Patient was then directly admitted to the hospitalist service and seen by interventional radiology with plans for stent placement today.  In the interim, biopsy results positive for pancreatic adenocarcinoma.  Diet has changed frequently since admission. He was changed from Regular to CLD today at 1040. Patient reports he has not had anything PO yet today.   Small bore NGT placed by ICU Charge Nurse late morning today. X-ray reading indicates gastric placement. GI note from today states that gastric  placement is fine.   Patient denies nausea but reports mild abdominal pain which is similar to his chronic abdominal.   He has not been weighed since admission on 7/19. Non-pitting edema to BLE documented in the edema section of flow sheet.   Per notes: - pancreatic adenocarcinoma complicated by biliary obstruction - adult FTT   Labs reviewed; CBGs: 254 and 147 mg/dl, Na: 146 mmol/l, K: 3.1 mmol/, LFTs elevated but  trending down from 7/25. Medications reviewed; 325 mg ferrous sulfate/day, sliding scale novolog, 10 units levemir/day, 40 mg IV protonix BID, 30 mEq Klor-Con x3 doses 7/26.    NUTRITION - FOCUSED PHYSICAL EXAM:  Flowsheet Row Most Recent Value  Orbital Region Mild depletion  Upper Arm Region Mild depletion  Thoracic and Lumbar Region Unable to assess  Buccal Region Mild depletion  Temple Region Mild depletion  Clavicle Bone Region Moderate depletion  Clavicle and Acromion Bone Region Moderate depletion  Scapular Bone Region Mild depletion  Dorsal Hand No depletion  Anterior Thigh Region Unable to assess  Posterior Calf Region Unable to assess  Edema (RD Assessment) Unable to assess  Hair Reviewed  Eyes Reviewed  Mouth Reviewed  Skin Reviewed  Nails Reviewed       Diet Order:   Diet Order             Diet clear liquid Room service appropriate? Yes; Fluid consistency: Thin  Diet effective now                   EDUCATION NEEDS:   Education needs have been addressed  Skin:  Skin Assessment: Reviewed  RN Assessment  Last BM:  7/21 -type 6  Height:   Ht Readings from Last 1 Encounters:  08/14/20 '5\' 10"'$  (1.778 m)    Weight:   Wt Readings from Last 1 Encounters:  08/14/20 68 kg      Estimated Nutritional Needs:  Kcal:  2000-2200 Protein:  105-120g Fluid:  2.2L/day      Jeff Matin, MS, RD, LDN, CNSC Inpatient Clinical Dietitian RD pager # available in [facility]  After hours/weekend pager # available in [facility]

## 2020-08-22 ENCOUNTER — Inpatient Hospital Stay (HOSPITAL_COMMUNITY): Payer: Medicare HMO

## 2020-08-22 DIAGNOSIS — K831 Obstruction of bile duct: Secondary | ICD-10-CM | POA: Diagnosis not present

## 2020-08-22 LAB — CBC
HCT: 35 % — ABNORMAL LOW (ref 39.0–52.0)
Hemoglobin: 11.8 g/dL — ABNORMAL LOW (ref 13.0–17.0)
MCH: 29.4 pg (ref 26.0–34.0)
MCHC: 33.7 g/dL (ref 30.0–36.0)
MCV: 87.1 fL (ref 80.0–100.0)
Platelets: 294 10*3/uL (ref 150–400)
RBC: 4.02 MIL/uL — ABNORMAL LOW (ref 4.22–5.81)
RDW: 24.6 % — ABNORMAL HIGH (ref 11.5–15.5)
WBC: 16.4 10*3/uL — ABNORMAL HIGH (ref 4.0–10.5)
nRBC: 0.3 % — ABNORMAL HIGH (ref 0.0–0.2)

## 2020-08-22 LAB — COMPREHENSIVE METABOLIC PANEL
ALT: 116 U/L — ABNORMAL HIGH (ref 0–44)
AST: 94 U/L — ABNORMAL HIGH (ref 15–41)
Albumin: 2.7 g/dL — ABNORMAL LOW (ref 3.5–5.0)
Alkaline Phosphatase: 140 U/L — ABNORMAL HIGH (ref 38–126)
Anion gap: 14 (ref 5–15)
BUN: 21 mg/dL (ref 8–23)
CO2: 24 mmol/L (ref 22–32)
Calcium: 9.9 mg/dL (ref 8.9–10.3)
Chloride: 110 mmol/L (ref 98–111)
Creatinine, Ser: 0.83 mg/dL (ref 0.61–1.24)
GFR, Estimated: >60 mL/min (ref >60)
Glucose, Bld: 341 mg/dL — ABNORMAL HIGH (ref 70–99)
Potassium: 3 mmol/L — ABNORMAL LOW (ref 3.5–5.1)
Sodium: 148 mmol/L — ABNORMAL HIGH (ref 135–145)
Total Bilirubin: 17.6 mg/dL — ABNORMAL HIGH (ref 0.3–1.2)
Total Protein: 6.9 g/dL (ref 6.5–8.1)

## 2020-08-22 LAB — GLUCOSE, CAPILLARY
Glucose-Capillary: 177 mg/dL — ABNORMAL HIGH (ref 70–99)
Glucose-Capillary: 197 mg/dL — ABNORMAL HIGH (ref 70–99)
Glucose-Capillary: 281 mg/dL — ABNORMAL HIGH (ref 70–99)
Glucose-Capillary: 340 mg/dL — ABNORMAL HIGH (ref 70–99)

## 2020-08-22 LAB — PHOSPHORUS
Phosphorus: 2.2 mg/dL — ABNORMAL LOW (ref 2.5–4.6)
Phosphorus: 2.4 mg/dL — ABNORMAL LOW (ref 2.5–4.6)

## 2020-08-22 LAB — MAGNESIUM: Magnesium: 2.4 mg/dL (ref 1.7–2.4)

## 2020-08-22 MED ORDER — INSULIN DETEMIR 100 UNIT/ML ~~LOC~~ SOLN
20.0000 [IU] | Freq: Every day | SUBCUTANEOUS | Status: DC
  Administered 2020-08-22 – 2020-08-23 (×2): 20 [IU] via SUBCUTANEOUS
  Filled 2020-08-22 (×3): qty 0.2

## 2020-08-22 MED ORDER — INSULIN ASPART 100 UNIT/ML IJ SOLN: 0.0000 [IU] | Freq: Three times a day (TID) | INTRAMUSCULAR | Status: DC

## 2020-08-22 MED ORDER — ONDANSETRON HCL 4 MG/2ML IJ SOLN
4.0000 mg | Freq: Four times a day (QID) | INTRAMUSCULAR | Status: DC | PRN
  Administered 2020-08-29 (×2): 4 mg via INTRAVENOUS
  Filled 2020-08-22 (×2): qty 2

## 2020-08-22 MED ORDER — POTASSIUM CHLORIDE CRYS ER 20 MEQ PO TBCR
30.0000 meq | EXTENDED_RELEASE_TABLET | ORAL | Status: DC
  Filled 2020-08-22: qty 1

## 2020-08-22 MED ORDER — POTASSIUM CHLORIDE 20 MEQ PO PACK
40.0000 meq | PACK | ORAL | Status: AC
Start: 2020-08-22 — End: 2020-08-22
  Administered 2020-08-22 (×2): 40 meq
  Filled 2020-08-22 (×2): qty 2

## 2020-08-22 MED ORDER — INSULIN ASPART 100 UNIT/ML IJ SOLN
0.0000 [IU] | Freq: Three times a day (TID) | INTRAMUSCULAR | Status: DC
  Administered 2020-08-22: 3 [IU] via SUBCUTANEOUS
  Administered 2020-08-22: 11 [IU] via SUBCUTANEOUS
  Administered 2020-08-23: 8 [IU] via SUBCUTANEOUS
  Administered 2020-08-23 (×2): 5 [IU] via SUBCUTANEOUS
  Administered 2020-08-24: 3 [IU] via SUBCUTANEOUS
  Administered 2020-08-24 (×2): 5 [IU] via SUBCUTANEOUS
  Administered 2020-08-25: 3 [IU] via SUBCUTANEOUS
  Administered 2020-08-25: 5 [IU] via SUBCUTANEOUS
  Administered 2020-08-25: 3 [IU] via SUBCUTANEOUS
  Administered 2020-08-26: 5 [IU] via SUBCUTANEOUS
  Administered 2020-08-26 – 2020-08-28 (×2): 2 [IU] via SUBCUTANEOUS
  Administered 2020-08-28 (×2): 5 [IU] via SUBCUTANEOUS
  Administered 2020-08-29: 2 [IU] via SUBCUTANEOUS
  Administered 2020-08-29: 5 [IU] via SUBCUTANEOUS
  Administered 2020-08-30 (×2): 3 [IU] via SUBCUTANEOUS
  Administered 2020-08-30: 8 [IU] via SUBCUTANEOUS
  Administered 2020-08-31: 2 [IU] via SUBCUTANEOUS
  Administered 2020-08-31: 5 [IU] via SUBCUTANEOUS
  Administered 2020-08-31: 8 [IU] via SUBCUTANEOUS
  Administered 2020-09-01: 5 [IU] via SUBCUTANEOUS
  Administered 2020-09-01 (×2): 3 [IU] via SUBCUTANEOUS
  Administered 2020-09-02: 2 [IU] via SUBCUTANEOUS
  Administered 2020-09-02: 5 [IU] via SUBCUTANEOUS
  Administered 2020-09-03 (×2): 3 [IU] via SUBCUTANEOUS
  Administered 2020-09-03: 2 [IU] via SUBCUTANEOUS
  Administered 2020-09-04: 8 [IU] via SUBCUTANEOUS
  Administered 2020-09-04: 2 [IU] via SUBCUTANEOUS
  Administered 2020-09-05 (×2): 3 [IU] via SUBCUTANEOUS

## 2020-08-22 MED ORDER — PANTOPRAZOLE SODIUM 40 MG IV SOLR
40.0000 mg | Freq: Every day | INTRAVENOUS | Status: DC
  Administered 2020-08-23 – 2020-09-05 (×12): 40 mg via INTRAVENOUS
  Filled 2020-08-22 (×12): qty 40

## 2020-08-22 MED ORDER — NYSTATIN 100000 UNIT/ML MT SUSP
5.0000 mL | Freq: Four times a day (QID) | OROMUCOSAL | Status: AC
  Administered 2020-08-22 – 2020-08-31 (×27): 500000 [IU] via ORAL
  Filled 2020-08-22 (×21): qty 5

## 2020-08-22 NOTE — Progress Notes (Addendum)
Referring Physician(s): Mansouraty,G  Supervising Physician: Markus Daft  Patient Status:  [facility] - In-pt  Chief Complaint:  Pancreatic cancer with malignant obstructive jaundice  Subjective: Patient doing fair today.  Has had some intermittent nausea and vomiting earlier after taking some medication; also with intermittent headaches, epigastric pain; NG in place   Allergies: Patient has no known allergies.  Medications: Prior to Admission medications   Medication Sig Start Date End Date Taking? Authorizing Provider  amLODipine (NORVASC) 5 MG tablet Take 5 mg by mouth daily. 06/19/20  Yes [provider]  aspirin 81 MG EC tablet Take 81 mg by mouth daily.   Yes [provider]  ferrous sulfate 325 (65 FE) MG tablet Take 325 mg by mouth daily with breakfast.   Yes [provider]  JARDIANCE 25 MG TABS tablet Take 25 mg by mouth daily. 07/23/20  Yes [provider]  loperamide (IMODIUM) 2 MG capsule Take by mouth as needed for diarrhea or loose stools.   Yes [provider]  metFORMIN (GLUCOPHAGE) 1000 MG tablet Take 1,000 mg by mouth 2 (two) times daily. 07/16/20  Yes [provider]  olmesartan (BENICAR) 40 MG tablet Take 40 mg by mouth daily. 06/25/20  Yes [provider]  omeprazole (PRILOSEC) 40 MG capsule Take 40 mg by mouth every morning. 03/01/20  Yes [provider]  ondansetron (ZOFRAN) 4 MG tablet Take 1 tablet (4 mg total) by mouth every 4 (four) hours as needed for nausea. 08/02/20  Yes Dayton Scrape A, NP  Oxycodone HCl 10 MG TABS Take 1 tablet (10 mg total) by mouth every 4 (four) hours as needed. Patient taking differently: Take 10 mg by mouth every 4 (four) hours as needed (pain). 08/02/20  Yes Dayton Scrape A, NP  pioglitazone (ACTOS) 45 MG tablet Take 45 mg by mouth daily. 06/25/20  Yes [provider]  rosuvastatin (CRESTOR) 5 MG tablet Take 5 mg by mouth once a week. 06/25/20  Yes  [provider]     Vital Signs: BP 140/64   Pulse (!) 104   Temp 98.9 F (37.2 C) (Oral)   Resp 20   Ht '5\' 10"'$  (1.778 m)   Wt 141 lb 12.1 oz (64.3 kg)   SpO2 100%   BMI 20.34 kg/m   Physical Exam awake, alert.  Biliary drain in place, insertion site okay, mildly tender to palpation, no evidence of  leaking at site, drain irrigated without difficulty, output 300 cc yesterday , about 50 to 75 cc today of green bile  Imaging: DG Abd 1 View  Result Date: 08/21/2020 CLINICAL DATA:  Few feeding tube placement EXAM: ABDOMEN - 1 VIEW COMPARISON:  None. FINDINGS: Soft feeding tube enters the stomach, with its tip in the fundus. Drainage catheter present in the right upper abdomen. IMPRESSION: Soft feeding tube tip in the fundus of the stomach. Electronically Signed   By: Nelson Chimes M.D.   On: 08/21/2020 12:18    Labs:  CBC: Recent Labs    08/18/20 0541 08/19/20 0554 08/21/20 0554 08/22/20 0552  WBC 13.6* 16.3* 13.7* 16.4*  HGB 9.4* 11.4* 10.5* 11.8*  HCT 26.4* 33.6* 30.7* 35.0*  PLT 271 271 236 294    COAGS: Recent Labs    08/14/20 1934 08/15/20 0452 08/16/20 0553  INR 1.7* 1.5* 1.1    BMP: Recent Labs    08/19/20 0554 08/20/20 0536 08/21/20 0554 08/22/20 0552  NA 157* 151* 146* 148*  K 3.5 3.0*  3.1* 3.0*  CL 120* 115* 110 110  CO2 '28 26 27 24  '$ GLUCOSE 268* 264* 254* 341*  BUN 29* '23 17 21  '$ CALCIUM 9.9 9.1 9.1 9.9  CREATININE 0.83 0.87 0.87 0.83  GFRNONAA >60 >60 >60 >60    LIVER FUNCTION TESTS: Recent Labs    08/19/20 0554 08/20/20 0536 08/21/20 0554 08/22/20 0552  BILITOT 20.4* 16.3* 15.5* 17.6*  AST 166* 118* 94* 94*  ALT 188* 147* 128* 116*  ALKPHOS 160* 133* 128* 140*  PROT 7.0 6.1* 6.2* 6.9  ALBUMIN 3.0* 2.5* 2.5* 2.7*    Assessment and Plan: Patient with history of pancreatic cancer with malignant biliary obstruction, status post external biliary drain placement on 7/21 and conversion to internal and external biliary drain on  7/22.  Afebrile, T-max 100.2 yesterday evening, drain output 300 cc yesterday, 50-75 cc today green bile, white blood cell count 16.4 up from 13.7, hemoglobin 11.8 up from 10.5, creatinine normal, potassium 3-replace, bilirubin 17.6 up from 15.5; recommend close monitoring of labs/drain flushes; if bilirubin continues to climb may need to perform follow-up cholangiogram.  Send bile for cultures.   Electronically Signed: D. Rowe Robert, PA-C 08/22/2020, 1:08 PM   I spent a total of 15 Minutes at the the patient's bedside AND on the patient's hospital floor or unit, greater than 50% of which was counseling/coordinating care for biliary drain    Patient ID: Jeff Jordan, male   DOB: 01-20-1956, 65 y.o.   MRN: [id number]

## 2020-08-22 NOTE — Progress Notes (Signed)
Progress Note  Chief Complaint:     biliary obstruction from pancreatic cancer , nausea / vomiting      ASSESSMENT AND PLAN  # 65 yo male with  biliary obstruction secondary to recently diagnosed pancreatic adenocarcinoma. He is s/p conversion of biliary drain to internal / external drain by IR on 7/22 ( ERCP couldn't be done due to duodenal stenosis).   --T bili was downtrending but up slightly overnight from 15.5 to 17.6. Alk phos also with mild elevation overnight from 128 >> 140. Liver enzymes stable / improving. Continue to monitor. He had had ~ 300 ml biliary output yesterday, so far ~ 50 ml this am. .   --He has established care with Oncology and sounds like plan is for eventual chemotherapy  # Leukocytosis, 13.7 >> 16.4. Low grade fever --Remains on Cipro, flagyl was discontinued --No obvious source of infection. 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 He is tolerating some liquids but solids tend to cause nausea and vomiting.   --Small bowel feed tube placed yesterday. Enteral feedings started. --Tolerating TF but vomiting some of his medications.  --Continue Zofran IV , will decrease frequency to prn  # Anemia, normocytic. Overall hgb is stable  at 11.8.   Questionable episode of hematemesis a few days ago which could be secondary to esophagitis from vomiting.  --Continue oral iron --Continue IV PPI, will reduce dose to once daily now  # Hypernatremia / hypokalemia. Na 148. D5W was discontinued. On enteral feedings now. Getting free water Q 4 hours --Of note patient says he has been vomiting some of his oral meds so not sure that Klor Con is being absorbed.    # Possible oral candidiasis. Thick white coating on tongue which could be due in part to [place] Con ( if tablets while) since he is vomiting some of the oral medications.  --Start  Nystatin swish and swallow    DIAGNOSTIC STUDIES     SUBJECTIVE    Sounds like rate of tube feeds had to be slowed down due to vomiting but he feels better today. However he reports vomiting of oral medications.        OBJECTIVE      Scheduled inpatient medications:   amLODipine  5 mg Oral Daily   feeding supplement  1 Container Oral TID BM   feeding supplement (PROSource TF)  45 mL Per Tube TID   ferrous sulfate  325 mg Oral Q breakfast   free water  100 mL Per Tube Q4H   insulin aspart  0-15 Units Subcutaneous TID WC   insulin aspart  0-5 Units Subcutaneous QHS   insulin detemir  20 Units Subcutaneous Daily   ondansetron (ZOFRAN) IV  4 mg Intravenous Q6H   pantoprazole  40 mg Intravenous Q12H   potassium chloride  30 mEq Oral Q3H   rosuvastatin  5 mg Oral Weekly   sodium chloride flush  5 mL Intracatheter Q8H   sodium chloride flush  5 mL Intracatheter Q8H   Continuous inpatient infusions:   feeding supplement (OSMOLITE 1.5 CAL) 1,000 mL (08/21/20 2157)   PRN inpatient medications: hydrALAZINE, morphine injection, oxyCODONE, promethazine  Vital signs in last 24 hours: Temp:  [98.7 F (37.1 C)-100.2 F (37.9 C)] 98.9 F (37.2 C) (07/27 0428) Pulse Rate:  [94-104] 104 (07/27 0428) Resp:  [14-20] 20 (07/27 0428) BP: (134-152)/(40-64)  140/64 (07/27 0428) SpO2:  [97 %-100 %] 100 % (07/27 0428) Weight:  [64.3 kg] 64.3 kg (07/27 0500) Last BM Date: 08/16/20  Intake/Output Summary (Last 24 hours) at 08/22/2020 0838 Last data filed at 08/22/2020 0600 Gross per 24 hour  Intake 546.08 ml  Output 1800 ml  Net -1253.92 ml     Physical Exam:  General: Alert thin male in NAD. SB feeding tube in place. Tube feeds at 25 ml / hr Heart:  Regular rate and rhythm. No lower extremity edema Pulmonary: Normal respiratory effort Abdomen: Soft, nondistended, nontender. Normal bowel sounds.  Neurologic: Alert and oriented Psych: Pleasant. Cooperative.   Filed Weights    08/08/20 1138 08/14/20 1203 08/22/20 0500  Weight: 72.6 kg 68 kg 64.3 kg    Intake/Output from previous day: 07/26 0701 - 07/27 0700 In: 546.1 [P.O.:140; NG/GT:401.1] Out: 1800 [Urine:1500; Drains:300] Intake/Output this shift: No intake/output data recorded.    Lab Results: Recent Labs    08/21/20 0554 08/22/20 0552  WBC 13.7* 16.4*  HGB 10.5* 11.8*  HCT 30.7* 35.0*  PLT 236 294   BMET Recent Labs    08/20/20 0536 08/21/20 0554 08/22/20 0552  NA 151* 146* 148*  K 3.0* 3.1* 3.0*  CL 115* 110 110  CO2 $Re'26 27 24  'HEC$ GLUCOSE 264* 254* 341*  BUN $Re'23 17 21  'oJq$ CREATININE 0.87 0.87 0.83  CALCIUM 9.1 9.1 9.9   LFT Recent Labs    08/22/20 0552  PROT 6.9  ALBUMIN 2.7*  AST 94*  ALT 116*  ALKPHOS 140*  BILITOT 17.6*   PT/INR No results for input(s): LABPROT, INR in the last 72 hours. Hepatitis Panel No results for input(s): HEPBSAG, HCVAB, HEPAIGM, HEPBIGM in the last 72 hours.  DG Abd 1 View  Result Date: 08/21/2020 CLINICAL DATA:  Few feeding tube placement EXAM: ABDOMEN - 1 VIEW COMPARISON:  None. FINDINGS: Soft feeding tube enters the stomach, with its tip in the fundus. Drainage catheter present in the right upper abdomen. IMPRESSION: Soft feeding tube tip in the fundus of the stomach. Electronically Signed   By: Nelson Chimes M.D.   On: 08/21/2020 12:18        Principal Problem:   Biliary obstruction Active Problems:   Pancreatic adenocarcinoma ([facility])   Diabetes mellitus type 2 in nonobese [facility])   Essential hypertension   Non-intractable vomiting   Malnutrition of moderate degree     LOS: 7 days   Tye Savoy ,NP 08/22/2020, 8:38 AM

## 2020-08-22 NOTE — Progress Notes (Signed)
OT Cancellation Note  Patient Details Name: Jeff Jordan MRN: [id number] DOB: 15-Nov-1955   Cancelled Treatment:    Reason Eval/Treat Not Completed: Patient declined, no reason specified patient declined to participate in session this AM. Will check back as able.  Jackelyn Poling OTR/L, [employer] [facility] Acute Rehabilitation Department Office# [phone] Pager# [phone]   [place] 08/22/2020, 1:03 PM

## 2020-08-22 NOTE — Progress Notes (Signed)
Pt dislodged NG tube while changing gown, per pt. NG tube remains in R nare and taped but has been pulled out to 10cm. MD made aware, tube feeding stopped. Films ordered to assess placement of NG tube.

## 2020-08-22 NOTE — Progress Notes (Signed)
Physical Therapy Treatment Patient Details Name: Jeff Jordan MRN: [id number] DOB: 1955/05/05 Today's Date: 08/22/2020    History of Present Illness Pt admitted with bilary obstruction 2*pancreatic adenocarcinoma and now s/p bilary drain placement.  Pt with hx of DM.    PT Comments    Pt ambulates 150 ft with RW this session, occasional shuffling step progression from bil feet without LOB, maintains trunk flexed due to "drain is heavy" complaint. Pt able to come to sitting EOB without physical assist, but does require assist to lift BLE back into bed. Pt declines sitting up in chair at EOS. Will continue to progress acute PT as able.    Follow Up Recommendations  Home health PT     Equipment Recommendations  Rolling walker with 5" wheels    Recommendations for Other Services       Precautions / Restrictions Precautions Precautions: Fall;Other (comment) Precaution Comments: bilary drain on R Restrictions Weight Bearing Restrictions: No    Mobility  Bed Mobility Overal bed mobility: Needs Assistance Bed Mobility: Supine to Sit;Sit to Supine  Supine to sit: Min guard Sit to supine: Min assist   General bed mobility comments: increased time to upright trunk into sitting using log roll technique; min A to lift BLE back into bed to return to supine    Transfers Overall transfer level: Needs assistance Equipment used: Rolling walker (2 wheeled) Transfers: Sit to/from Stand Sit to Stand: Min guard  General transfer comment: pt powers to stand with BUE assisting, good steadiness upon rising  Ambulation/Gait Ambulation/Gait assistance: Min guard Gait Distance (Feet): 150 Feet Assistive device: Rolling walker (2 wheeled) Gait Pattern/deviations: Step-through pattern;Decreased stride length;Trunk flexed;Shuffle Gait velocity: decreased   General Gait Details: pt ambulates with RW using step through pattern, occasional shuffling step progression bil, no LOB, maintains  trunk flexed reporting "drain is heavyDealer    Modified Rankin (Stroke Patients Only)       Balance Overall balance assessment: Needs assistance   Sitting balance-Leahy Scale: Good Sitting balance - Comments: seated EOB   Standing balance support: Bilateral upper extremity supported Standing balance-Leahy Scale: Poor Standing balance comment: reliant on UE support           Cognition Arousal/Alertness: Awake/alert Behavior During Therapy: WFL for tasks assessed/performed;Flat affect Overall Cognitive Status: Within Functional Limits for tasks assessed  General Comments: Pt minimally conversational, answers questions and follows commands appropriately      Exercises      General Comments        Pertinent Vitals/Pain Pain Assessment: No/denies pain    Home Living                      Prior Function            PT Goals (current goals can now be found in the care plan section) Acute Rehab PT Goals Patient Stated Goal: Regain IND PT Goal Formulation: With patient Time For Goal Achievement: 09/02/20 Potential to Achieve Goals: Good Progress towards PT goals: Progressing toward goals    Frequency    Min 3X/week      PT Plan Current plan remains appropriate    Co-evaluation              AM-PAC PT "6 Clicks" Mobility   Outcome Measure  Help needed turning from your back to your side while in a flat bed without using  bedrails?: A Little Help needed moving from lying on your back to sitting on the side of a flat bed without using bedrails?: A Little Help needed moving to and from a bed to a chair (including a wheelchair)?: A Little Help needed standing up from a chair using your arms (e.g., wheelchair or bedside chair)?: A Little Help needed to walk in hospital room?: A Little Help needed climbing 3-5 steps with a railing? : A Lot 6 Click Score: 17    End of Session Equipment Utilized During  Treatment: Gait belt Activity Tolerance: Patient tolerated treatment well Patient left: in bed;with call bell/phone within reach;with bed alarm set Nurse Communication: Mobility status PT Visit Diagnosis: Difficulty in walking, not elsewhere classified (R26.2)     Time: [id number] PT Time Calculation (min) (ACUTE ONLY): 21 min  Charges:  $Gait Training: 8-22 mins                      Tori Sena Hoopingarner PT, DPT 08/22/20, 12:26 PM

## 2020-08-22 NOTE — Progress Notes (Addendum)
PROGRESS NOTE    Jeff Jordan  [id number] DOB: 13-Aug-1955 DOA: 08/14/2020 PCP: Georganna Skeans, PA-C    Brief Narrative:  Jeff Jordan is a 65 year old male with past medical history significant for essential hypertension, type 2 diabetes mellitus and recent diagnosis of pancreatic adenocarcinoma initially was seen by gastroenterology, Dr. Rush Landmark for biliary stent placement for obstruction and underwent ERCP on 7/19 with inability to place a stent and was subsequently directly admitted to the hospitalist service for plans of IR intervention.   Assessment & Plan:   Principal Problem:   Biliary obstruction Active Problems:   Pancreatic adenocarcinoma (HCC)   Diabetes mellitus type 2 in nonobese [facility]   Essential hypertension   Non-intractable vomiting   Malnutrition of moderate degree   Pancreatic adenocarcinoma complicated by biliary obstruction Patient was initially referred to gastroenterology, Dr. Rush Landmark for ERCP and stent placement on 7/19 that was unsuccessful and patient was subsequently admitted to the hospital service for IR intervention.  Patient underwent IR external biliary drain on 7/21 and conversion to internal and external biliary drain on 7/22. --Drain output 300 mL past 24 hours --AST 163>>287>>166>118>94>94 --ALT 115>213>>188>147>128>116 --Tbili 18.0>>26.8>>20.4>16.3>15.5>17.6 --pending initial oncology evaluation, referral made to [facility] oncology office --Oxycodone 10 mg p.o. every 6 hours as needed moderate pain --Morphine 2 mg IV every 2 hours as needed severe breakthrough pain --Continue to monitor drain output --CMP daily  Hypernatremia Etiology likely secondary to poor oral intake in the setting of pancreatic adenocarcinoma and biliary obstruction. --Na 142>>159>157>151>146>148 --Started on tube feeds through --repeat CMP in am  Adult failure to thrive Patient continues with poor oral intake in the setting of pancreatic adenocarcinoma  with biliary obstruction.  Biliary obstruction improving; although with very little oral intake and nausea/vomiting with solid food. --Continues on tube feeds via small bore feeding tube which was placed on 7/26 --Continue to closely monitor intake --May end up needing PEG tube placement  Leukocytosis CT chest with contrast with no consolidative process.  CT pelvis with contrast with no signs of infectious process.  Unclear etiology of leukocytosis, no infectious source identified.  Suspect possible hemoconcentration in the setting of poor oral intake and dehydration.  Completed course of ciprofloxacin and Flagyl. --WBC 5.7>>11.7>13.6>16.3>13.7>16.4 --Repeat CBC in the a.m.  Type 2 diabetes mellitus Outpatient regimen includes Jardiance 25 mg p.o. daily, metformin 1000 mg p.o. twice daily, Actos 45 mg p.o. daily.  Blood sugars elevated during hospitalization due to D5W needed for hypernatremia; but now will DC today. --Increase Lantus to 20 units [place] daily --Sensitive SSI for coverage --CBGs before every meal/at bedtime  Essential hypertension --Amlodipine 5 mg p.o. daily --Hydralazine IV as needed  HLD: Crestor 5 mg p.o. weekly  GERD: Protonix 40 mg IV every 12 hours   DVT prophylaxis: SCDs Start: 08/14/20 1624   Code Status: Full Code Family Communication: No family present at bedside this morning  Disposition Plan:  Level of care: Med-Surg Status is: Inpatient  Remains inpatient appropriate because:Unsafe d/c plan, IV treatments appropriate due to intensity of illness or inability to take PO, and Inpatient level of care appropriate due to severity of illness  Dispo: The patient is from: Home              Anticipated d/c is to: Home              Patient currently is not medically stable to d/c.   Difficult to place patient No   Consultants:  [facility] Interventional radiology  Procedures:  IR biliary drain placement Small bore feeding tube placement,  7/26  Antimicrobials:  Cipro 7/22 - 7/27 Flagyl 7/24 - 7/27 Cefoxitin 7/21 - 7/21    Subjective: Patient seen examined at bedside, resting comfortably.  Started on tube feeds yesterday.  Continues with difficulty swallowing solids with persistent nausea/vomiting.  Continues with good biliary drain output, although total bili remains slightly increased today.  Otherwise LFTs continue to trend down.  Afebrile.  Discussed with him at bedside if unable to tolerate oral medications/intake, may need to consider PEG tube placement which he seems amenable for.  No family present at bedside this morning.  No other questions or concerns at this time. Denies headache, no chest pain, no palpitations, no shortness of breath, no cough/congestion, no fever/chills/night sweats, no nausea/vomiting/diarrhea.  No acute concerns overnight per nursing staff.   Objective: Vitals:   08/21/20 2025 08/22/20 0428 08/22/20 0500 08/22/20 1318  BP: (!) 152/63 140/64  131/63  Pulse: 96 (!) 104  97  Resp: '14 20  18  '$ Temp: 100.2 F (37.9 C) 98.9 F (37.2 C)  98 F (36.7 C)  TempSrc: Oral Oral  Oral  SpO2: 97% 100%  100%  Weight:   64.3 kg   Height:        Intake/Output Summary (Last 24 hours) at 08/22/2020 1547 Last data filed at 08/22/2020 0947 Gross per 24 hour  Intake 406.08 ml  Output 1850 ml  Net -1443.92 ml   Filed Weights   08/08/20 1138 08/14/20 1203 08/22/20 0500  Weight: 72.6 kg 68 kg 64.3 kg    Examination:  General exam: Appears calm and comfortable, chronically ill in appearance, scleral icterus, morbid or feeding tube noted  Respiratory system: Clear to auscultation. Respiratory effort normal.  On room air Cardiovascular system: S1 & S2 heard, RRR. No JVD, murmurs, rubs, gallops or clicks. No pedal edema. Gastrointestinal system: Abdomen is nondistended, soft and nontender. No organomegaly or masses felt. Normal bowel sounds heard.  Biliary drain noted with dark bilious fluid present in  bag Central nervous system: Alert and oriented. No focal neurological deficits. Extremities: Symmetric 5 x 5 power. Skin: Jaundice noted, otherwise no rashes, lesions or ulcers Psychiatry: Judgement and insight appear normal. Mood & affect appropriate.     Data Reviewed: I have personally reviewed following labs and imaging studies  CBC: Recent Labs  Lab 08/17/20 0519 08/18/20 0541 08/19/20 0554 08/21/20 0554 08/22/20 0552  WBC 11.7* 13.6* 16.3* 13.7* 16.4*  HGB 11.7* 9.4* 11.4* 10.5* 11.8*  HCT 33.7* 26.4* 33.6* 30.7* 35.0*  MCV 81.6 82.0 85.5 87.0 87.1  PLT 276 271 271 236 [id number]   Basic Metabolic Panel: Recent Labs  Lab 08/18/20 0541 08/19/20 0554 08/20/20 0536 08/21/20 0554 08/21/20 1528 08/21/20 1948 08/22/20 0552  NA 159* 157* 151* 146*  --   --  148*  K 3.5 3.5 3.0* 3.1*  --   --  3.0*  CL 119* 120* 115* 110  --   --  110  CO2 '27 28 26 27  '$ --   --  24  GLUCOSE 200* 268* 264* 254*  --   --  341*  BUN 25* 29* 23 17  --   --  21  CREATININE 0.76 0.83 0.87 0.87  --   --  0.83  CALCIUM 10.2 9.9 9.1 9.1  --   --  9.9  MG  --   --   --  2.0  --   --  2.4  PHOS  --   --   --   --  3.0 2.9 2.2*   GFR: Estimated Creatinine Clearance: 79.6 mL/min (by C-G formula based on SCr of 0.83 mg/dL). Liver Function Tests: Recent Labs  Lab 08/18/20 0541 08/19/20 0554 08/20/20 0536 08/21/20 0554 08/22/20 0552  AST 233* 166* 118* 94* 94*  ALT 212* 188* 147* 128* 116*  ALKPHOS 170* 160* 133* 128* 140*  BILITOT 23.1* 20.4* 16.3* 15.5* 17.6*  PROT 6.9 7.0 6.1* 6.2* 6.9  ALBUMIN 2.8* 3.0* 2.5* 2.5* 2.7*   Recent Labs  Lab 08/16/20 0910  LIPASE 83*   No results for input(s): AMMONIA in the last 168 hours. Coagulation Profile: Recent Labs  Lab 08/16/20 0553  INR 1.1   Cardiac Enzymes: No results for input(s): CKTOTAL, CKMB, CKMBINDEX, TROPONINI in the last 168 hours. BNP (last 3 results) No results for input(s): PROBNP in the last 8760 hours. HbA1C: No results for  input(s): HGBA1C in the last 72 hours. CBG: Recent Labs  Lab 08/21/20 2027 08/22/20 0025 08/22/20 0429 08/22/20 0724 08/22/20 1107  GLUCAP 143* 197* 281* 340* 177*   Lipid Profile: No results for input(s): CHOL, HDL, LDLCALC, TRIG, CHOLHDL, LDLDIRECT in the last 72 hours. Thyroid Function Tests: No results for input(s): TSH, T4TOTAL, FREET4, T3FREE, THYROIDAB in the last 72 hours. Anemia Panel: No results for input(s): VITAMINB12, FOLATE, FERRITIN, TIBC, IRON, RETICCTPCT in the last 72 hours. Sepsis Labs: No results for input(s): PROCALCITON, LATICACIDVEN in the last 168 hours.  Recent Results (from the past 240 hour(s))  SARS CORONAVIRUS 2 (TAT 6-24 HRS) Nasopharyngeal Nasopharyngeal Swab     Status: None   Collection Time: 08/14/20  6:04 PM   Specimen: Nasopharyngeal Swab  Result Value Ref Range Status   SARS Coronavirus 2 NEGATIVE NEGATIVE Final    Comment: (NOTE) SARS-CoV-2 target nucleic acids are NOT DETECTED.  The SARS-CoV-2 RNA is generally detectable in upper and lower respiratory specimens during the acute phase of infection. Negative results do not preclude SARS-CoV-2 infection, do not rule out co-infections with other pathogens, and should not be used as the sole basis for treatment or other patient management decisions. Negative results must be combined with clinical observations, patient history, and epidemiological information. The expected result is Negative.  Fact Sheet for Patients: SugarRoll.be  Fact Sheet for Healthcare Providers: https://www.woods-mathews.com/  This test is not yet approved or cleared by the [place] FDA and  has been authorized for detection and/or diagnosis of SARS-CoV-2 by FDA under an Emergency Use Authorization (EUA). This EUA will remain  in effect (meaning this test can be used) for the duration of the COVID-19 declaration under Se ction 564(b)(1) of the Act, 21 U.S.C. section  360bbb-3(b)(1), unless the authorization is terminated or revoked sooner.  Performed at [facility], [place] [address] [place], [place] [address]   Body fluid culture w Gram Stain     Status: None (Preliminary result)   Collection Time: 08/22/20 11:12 AM   Specimen: BILE; Body Fluid  Result Value Ref Range Status   Specimen Description   Final    BILE Performed at [facility] [address], [place], [place] [address]    Special Requests   Final    Normal Performed at [facility], [place] [address] [place], [place] [address]    Gram Stain   Final    NO WBC SEEN MODERATE YEAST FEW GRAM POSITIVE COCCI Performed at [facility], [place] [address], [place], [place] [address]    Culture PENDING  Incomplete  Report Status PENDING  Incomplete         Radiology Studies: DG Abd 1 View  Result Date: 08/22/2020 CLINICAL DATA:  Pt has biliary obstruction from pancreatic cancer, nausea, vomiting. EXAM: ABDOMEN - 1 VIEW COMPARISON:  08/21/2020 FINDINGS: Biliary tube in unchanged position. The bowel gas pattern is normal. No radio-opaque calculi or other significant radiographic abnormality are seen. IMPRESSION: Biliary tube in unchanged position. Electronically Signed   By: Kathreen Devoid   On: 08/22/2020 14:49   DG Abd 1 View  Result Date: 08/21/2020 CLINICAL DATA:  Few feeding tube placement EXAM: ABDOMEN - 1 VIEW COMPARISON:  None. FINDINGS: Soft feeding tube enters the stomach, with its tip in the fundus. Drainage catheter present in the right upper abdomen. IMPRESSION: Soft feeding tube tip in the fundus of the stomach. Electronically Signed   By: Nelson Chimes M.D.   On: 08/21/2020 12:18        Scheduled Meds:  amLODipine  5 mg Oral Daily   feeding supplement  1 Container Oral TID BM   feeding supplement (PROSource TF)  45 mL Per Tube TID   ferrous sulfate  325 mg Oral Q breakfast   free water  100 mL Per Tube Q4H   insulin aspart   0-15 Units Subcutaneous TID WC   insulin aspart  0-5 Units Subcutaneous QHS   insulin detemir  20 Units Subcutaneous Daily   nystatin  5 mL Oral QID   [START ON 08/23/2020] pantoprazole  40 mg Intravenous Q1400   potassium chloride  40 mEq Per Tube Q4H   rosuvastatin  5 mg Oral Weekly   sodium chloride flush  5 mL Intracatheter Q8H   sodium chloride flush  5 mL Intracatheter Q8H   Continuous Infusions:  feeding supplement (OSMOLITE 1.5 CAL) Stopped (08/22/20 1338)      LOS: 7 days    Time spent: 39 minutes spent on chart review, discussion with nursing staff, consultants, updating family and interview/physical exam; more than 50% of that time was spent in counseling and/or coordination of care.    Sharlette Jansma J [place] DO [facility] Available via Epic secure chat 7am-7pm After these hours, please refer to coverage provider listed on [email or web address] 08/22/2020, 3:47 PM

## 2020-08-23 ENCOUNTER — Inpatient Hospital Stay (HOSPITAL_COMMUNITY): Payer: Medicare HMO

## 2020-08-23 DIAGNOSIS — K831 Obstruction of bile duct: Secondary | ICD-10-CM | POA: Diagnosis not present

## 2020-08-23 DIAGNOSIS — C259 Malignant neoplasm of pancreas, unspecified: Secondary | ICD-10-CM | POA: Diagnosis not present

## 2020-08-23 DIAGNOSIS — E440 Moderate protein-calorie malnutrition: Secondary | ICD-10-CM | POA: Diagnosis not present

## 2020-08-23 HISTORY — PX: IR CHOLANGIOGRAM EXISTING TUBE: IMG6040

## 2020-08-23 LAB — COMPREHENSIVE METABOLIC PANEL
ALT: 112 U/L — ABNORMAL HIGH (ref 0–44)
AST: 109 U/L — ABNORMAL HIGH (ref 15–41)
Albumin: 2.8 g/dL — ABNORMAL LOW (ref 3.5–5.0)
Alkaline Phosphatase: 149 U/L — ABNORMAL HIGH (ref 38–126)
Anion gap: 10 (ref 5–15)
BUN: 19 mg/dL (ref 8–23)
CO2: 26 mmol/L (ref 22–32)
Calcium: 9.7 mg/dL (ref 8.9–10.3)
Chloride: 114 mmol/L — ABNORMAL HIGH (ref 98–111)
Creatinine, Ser: 0.73 mg/dL (ref 0.61–1.24)
GFR, Estimated: >60 mL/min (ref >60)
Glucose, Bld: 226 mg/dL — ABNORMAL HIGH (ref 70–99)
Potassium: 3.3 mmol/L — ABNORMAL LOW (ref 3.5–5.1)
Sodium: 150 mmol/L — ABNORMAL HIGH (ref 135–145)
Total Bilirubin: 18.7 mg/dL (ref 0.3–1.2)
Total Protein: 7.1 g/dL (ref 6.5–8.1)

## 2020-08-23 LAB — GLUCOSE, CAPILLARY
Glucose-Capillary: 250 mg/dL — ABNORMAL HIGH (ref 70–99)
Glucose-Capillary: 203 mg/dL — ABNORMAL HIGH (ref 70–99)
Glucose-Capillary: 268 mg/dL — ABNORMAL HIGH (ref 70–99)
Glucose-Capillary: 248 mg/dL — ABNORMAL HIGH (ref 70–99)

## 2020-08-23 LAB — CBC
HCT: 34 % — ABNORMAL LOW (ref 39.0–52.0)
Hemoglobin: 11.7 g/dL — ABNORMAL LOW (ref 13.0–17.0)
MCH: 29.9 pg (ref 26.0–34.0)
MCHC: 34.4 g/dL (ref 30.0–36.0)
MCV: 87 fL (ref 80.0–100.0)
Platelets: 294 10*3/uL (ref 150–400)
RBC: 3.91 MIL/uL — ABNORMAL LOW (ref 4.22–5.81)
RDW: 25.3 % — ABNORMAL HIGH (ref 11.5–15.5)
WBC: 15.9 10*3/uL — ABNORMAL HIGH (ref 4.0–10.5)
nRBC: 0.1 % (ref 0.0–0.2)

## 2020-08-23 MED ORDER — FREE WATER
150.0000 mL | Status: DC
  Administered 2020-08-23 – 2020-08-24 (×7): 150 mL

## 2020-08-23 MED ORDER — INSULIN DETEMIR 100 UNIT/ML ~~LOC~~ SOLN
30.0000 [IU] | Freq: Every day | SUBCUTANEOUS | Status: DC
  Filled 2020-08-23: qty 0.3

## 2020-08-23 MED ORDER — POTASSIUM CHLORIDE 10 MEQ/100ML IV SOLN
10.0000 meq | INTRAVENOUS | Status: AC
  Administered 2020-08-23 (×6): 10 meq via INTRAVENOUS
  Filled 2020-08-23 (×3): qty 100

## 2020-08-23 MED ORDER — INSULIN DETEMIR 100 UNIT/ML ~~LOC~~ SOLN
10.0000 [IU] | Freq: Once | SUBCUTANEOUS | Status: AC
  Administered 2020-08-23: 10 [IU] via SUBCUTANEOUS
  Filled 2020-08-23: qty 0.1

## 2020-08-23 MED ORDER — IOHEXOL 300 MG/ML  SOLN
10.0000 mL | Freq: Once | INTRAMUSCULAR | Status: AC | PRN
  Administered 2020-08-23: 10 mL

## 2020-08-23 NOTE — Progress Notes (Addendum)
Progress Note  Chief Complaint:    biliary obstruction / duodenal stenosis , pancreatic cancer   Attending physician's note   I have taken an interval history, reviewed the chart and examined the patient. I agree with the Advanced Practitioner's note, impression and recommendations.    Total bili remains elevated, is not downtrending as expected.  He may need drain check by IR to make sure it is positioned correctly and is not getting clogged Continue with enteral feeding and oral diet as tolerated   I have spent 25 minutes of patient care (this includes precharting, chart review, review of results, face-to-face time used for counseling as well as treatment plan and follow-up. The patient was provided an opportunity to ask questions and all were answered. The patient agreed with the plan and demonstrated an understanding of the instructions.  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 He is s/p percutaneous drain placement with subsequent internalization on 7/22 ( ERCP couldn't be done due to duodenal stenosis). Bilirubin had improved but now on upward trend with persistent leukocytosis -Due to inadequate PO intake related to nausea / vomiting a small bowel  feeding tube was placed. Enteral feeding in progress.  --T bili still with upward trend 15.5 >> 17.6 >> 18.7.  Alk phos also on upward trend. Enzymes stable.  Reviewed biliary output with RN. Seems he has had only about 75 -100 ml output since 10 pm when bag was emptied. IR following. He may need drain study ( and fluid culture given persistent leukocytosis).    --He has established care with Oncology and sounds like plan is for eventual chemotherapy   # Anemia, normocytic. Overall hgb is stable  at 11.7    --Continue oral iron  # Hypernatremia.  Na+  148 >> 150 overnight. Free water via  feeding tube was increased today.   # Possible oral candidiasis. Thick white coating on tongue which could be due in part to [place] Con ( if tablets while) since he is vomiting some of the oral medications. --Continue Nystatin swish and swallow    DIAGNOSTIC STUDIES     SUBJECTIVE   IV burning, getting K+. No nausea /vomiting now. Tolerating enteral feedings, currently at 35 ml / hr       OBJECTIVE      Scheduled inpatient medications:   amLODipine  5 mg Oral Daily   feeding supplement  1 Container Oral TID BM   feeding supplement (PROSource TF)  45 mL Per Tube TID   ferrous sulfate  325 mg Oral Q breakfast   free water  150 mL Per Tube Q4H   insulin aspart  0-15 Units Subcutaneous TID WC   insulin aspart  0-5 Units Subcutaneous QHS   insulin detemir  20 Units Subcutaneous Daily   nystatin  5 mL Oral QID   pantoprazole  40 mg Intravenous Q1400   rosuvastatin  5 mg Oral Weekly   sodium chloride flush  5 mL Intracatheter Q8H   sodium chloride flush  5 mL Intracatheter Q8H   Continuous inpatient infusions:   feeding supplement (OSMOLITE 1.5 CAL) 35 mL/hr at 08/22/20 2345   potassium chloride     PRN inpatient medications: hydrALAZINE, morphine injection, ondansetron (ZOFRAN) IV, oxyCODONE  Vital signs in last 24 hours: Temp:  [98 F (36.7 C)-99.3 F (37.4 C)]  99.3 F (37.4 C) (07/28 0425) Pulse Rate:  [97-110] 106 (07/28 0425) Resp:  [18] 18 (07/28 0425) BP: (131-187)/(46-72) 159/58 (07/28 0425) SpO2:  [100 %] 100 % (07/28 0425) Weight:  [64.6 kg] 64.6 kg (07/28 0500) Last BM Date: 08/22/20  Intake/Output Summary (Last 24 hours) at 08/23/2020 0847 Last data filed at 08/23/2020 0555 Gross per 24 hour  Intake 409.25 ml  Output 450 ml  Net -40.75 ml     Physical Exam:  General: Alert male in NAD Heart:  Regular rate and rhythm. No lower extremity edema Pulmonary: Normal respiratory effort Abdomen: Soft, nondistended, nontender. Normal bowel sounds.   Neurologic: Alert and oriented Psych: Pleasant. Cooperative.   Filed Weights   08/14/20 1203 08/22/20 0500 08/23/20 0500  Weight: 68 kg 64.3 kg 64.6 kg    Intake/Output from previous day: 07/27 0701 - 07/28 0700 In: 914.3 [P.O.:50; NG/GT:854.3] Out: 450 [Urine:300; Drains:150] Intake/Output this shift: No intake/output data recorded.    Lab Results: Recent Labs    08/21/20 0554 08/22/20 0552 08/23/20 0606  WBC 13.7* 16.4* 15.9*  HGB 10.5* 11.8* 11.7*  HCT 30.7* 35.0* 34.0*  PLT 236 294 294   BMET Recent Labs    08/21/20 0554 08/22/20 0552 08/23/20 0606  NA 146* 148* 150*  K 3.1* 3.0* 3.3*  CL 110 110 114*  CO2 _0 GLUCOSE 254* 341* 226*  BUN _1 CREATININE 0.87 0.83 0.73  CALCIUM 9.1 9.9 9.7   LFT Recent Labs    08/23/20 0606  PROT 7.1  ALBUMIN 2.8*  AST 109*  ALT 112*  ALKPHOS 149*  BILITOT 18.7*   PT/INR No results for input(s): LABPROT, INR in the last 72 hours. Hepatitis Panel No results for input(s): HEPBSAG, HCVAB, HEPAIGM, HEPBIGM in the last 72 hours.  DG Abd 1 View  Result Date: 08/22/2020 CLINICAL DATA:  Confirm NG tube placement EXAM: ABDOMEN - 1 VIEW COMPARISON:  None. FINDINGS: There is a percutaneous biliary stent in place. There is a weighted tip enteric tube coiled with tip overlying the stomach the lung bases are clear. Nonobstructive upper abdominal bowel gas pattern. IMPRESSION: Weighted tip enteric tube is coiled in the left upper quadrant overlying the stomach. Electronically Signed   By: Maurine Simmering   On: 08/22/2020 20:16   DG Abd 1 View  Result Date: 08/22/2020 CLINICAL DATA:  Pt has biliary obstruction from pancreatic cancer, nausea, vomiting. EXAM: ABDOMEN - 1 VIEW COMPARISON:  08/21/2020 FINDINGS: Biliary tube in unchanged position. The bowel gas pattern is normal. No radio-opaque calculi or other significant radiographic abnormality are seen. IMPRESSION: Biliary tube in unchanged position. Electronically Signed    By: Kathreen Devoid   On: 08/22/2020 14:49   DG Abd 1 View  Result Date: 08/21/2020 CLINICAL DATA:  Few feeding tube placement EXAM: ABDOMEN - 1 VIEW COMPARISON:  None. FINDINGS: Soft feeding tube enters the stomach, with its tip in the fundus. Drainage catheter present in the right upper abdomen. IMPRESSION: Soft feeding tube tip in the fundus of the stomach. Electronically Signed   By: Nelson Chimes M.D.   On: 08/21/2020 12:18      Principal Problem:   Biliary obstruction Active Problems:   Pancreatic adenocarcinoma (HCC)   Diabetes mellitus type 2 in nonobese [facility])   Essential hypertension   Non-intractable vomiting   Malnutrition of moderate degree     LOS: 8 days   Tye Savoy ,NP 08/23/2020, 8:47 AM

## 2020-08-23 NOTE — Care Management Important Message (Signed)
Important Message  Patient Details IM Letter given to the Patient. Name: Jeff Jordan MRN: [id number] Date of Birth: 11-29-1955   Medicare Important Message Given:  Yes     Kerin Salen 08/23/2020, 10:23 AM

## 2020-08-23 NOTE — Progress Notes (Signed)
PROGRESS NOTE    Jeff Jordan  [id number] DOB: 07-29-1955 DOA: 08/14/2020 PCP: Georganna Skeans, PA-C    Brief Narrative:  Jeff Jordan is a 65 year old male with past medical history significant for essential hypertension, type 2 diabetes mellitus and recent diagnosis of pancreatic adenocarcinoma initially was seen by gastroenterology, Dr. Rush Landmark for biliary stent placement for obstruction and underwent ERCP on 7/19 with inability to place a stent and was subsequently directly admitted to the hospitalist service for plans of IR intervention.   Assessment & Plan:   Principal Problem:   Biliary obstruction Active Problems:   Pancreatic adenocarcinoma (HCC)   Diabetes mellitus type 2 in nonobese [facility])   Essential hypertension   Non-intractable vomiting   Malnutrition of moderate degree   Pancreatic adenocarcinoma complicated by biliary obstruction Patient was initially referred to gastroenterology, Dr. Rush Landmark for ERCP and stent placement on 7/19 that was unsuccessful and patient was subsequently admitted to the hospital service for IR intervention.  Patient underwent IR external biliary drain on 7/21 and conversion to internal and external biliary drain on 7/22. --Drain output 150 mL past 24 hours --AST 163>>287>>166>118>94>94 --ALT 115>213>>188>147>128>116 --Tbili 18.0>>26.8>>20.4>16.3>15.5>17.6>18.7 --pending initial oncology evaluation, referral made to [facility] oncology office --Oxycodone 10 mg p.o. every 6 hours as needed moderate pain --Morphine 2 mg IV every 2 hours as needed severe breakthrough pain --Continue to monitor drain output -- IR to perform cholangiogram for drain check today given uptrending bilirubin --CMP daily  Hypernatremia Etiology likely secondary to poor oral intake in the setting of pancreatic adenocarcinoma and biliary obstruction. --Na 142>>159>157>151>146>148>150 --Increase free water flushes 250 mL every 4 hours today --repeat CMP in  am  Adult failure to thrive Patient continues with poor oral intake in the setting of pancreatic adenocarcinoma with biliary obstruction.  Biliary obstruction improving; although with very little oral intake and nausea/vomiting with solid food. --Continues on tube feeds via small bore feeding tube which was placed on 7/26 --Continue to closely monitor intake --May end up needing G tube placement  Leukocytosis CT chest with contrast with no consolidative process.  CT pelvis with contrast with no signs of infectious process.  Unclear etiology of leukocytosis, no infectious source identified.  Suspect possible hemoconcentration in the setting of poor oral intake and dehydration.  Completed course of ciprofloxacin and Flagyl. --WBC 5.7>>11.7>13.6>16.3>13.7>16.4>15.9 -- CBC daily  Type 2 diabetes mellitus Outpatient regimen includes Jardiance 25 mg p.o. daily, metformin 1000 mg p.o. twice daily, Actos 45 mg p.o. daily.  Blood sugars elevated during hospitalization due to D5W needed for hypernatremia; but now will DC today. --Increase Lantus to 30 units Parkline daily --Sensitive SSI for coverage --CBGs before every meal/at bedtime  Essential hypertension --Amlodipine 5 mg p.o. daily --Hydralazine IV as needed  HLD: Crestor 5 mg p.o. weekly  GERD: Protonix 40 mg IV every 12 hours   DVT prophylaxis: SCDs Start: 08/14/20 1624   Code Status: Full Code Family Communication: No family present at bedside this morning  Disposition Plan:  Level of care: Med-Surg Status is: Inpatient  Remains inpatient appropriate because:Unsafe d/c plan, IV treatments appropriate due to intensity of illness or inability to take PO, and Inpatient level of care appropriate due to severity of illness  Dispo: The patient is from: Home              Anticipated d/c is to: Home              Patient currently is not medically stable to d/c.  Difficult to place patient No   Consultants:  [place]  Gastroenterology Interventional radiology  Procedures:  IR biliary drain placement Small bore feeding tube placement, 7/26  Antimicrobials:  Cipro 7/22 - 7/27 Flagyl 7/24 - 7/27 Cefoxitin 7/21 - 7/21    Subjective: Patient seen examined at bedside, resting comfortably.  States does not feel well this morning.  Total bilirubin continues to trend up with only 175 mL out of biliary drain past 24 hours.  Seen by IR this morning and plans for cholangiogram for drain check.  Remains afebrile, leukocytosis slightly improved today. Discussed with him at bedside if unable to tolerate oral medications/intake, may need to consider PEG/G-tube placement which he seems amenable for.  No family present at bedside this morning.  No other questions or concerns at this time. Denies headache, no chest pain, no palpitations, no shortness of breath, no cough/congestion, no fever/chills/night sweats, no nausea/vomiting/diarrhea.  No acute concerns overnight per nursing staff.   Objective: Vitals:   08/22/20 2025 08/22/20 2254 08/23/20 0425 08/23/20 0500  BP: (!) 187/72 (!) 168/46 (!) 159/58   Pulse: 99 (!) 110 (!) 106   Resp: 18  18   Temp: 98.5 F (36.9 C)  99.3 F (37.4 C)   TempSrc: Oral  Oral   SpO2: 100%  100%   Weight:    64.6 kg  Height:        Intake/Output Summary (Last 24 hours) at 08/23/2020 1108 Last data filed at 08/23/2020 0555 Gross per 24 hour  Intake 409.25 ml  Output 400 ml  Net 9.25 ml   Filed Weights   08/14/20 1203 08/22/20 0500 08/23/20 0500  Weight: 68 kg 64.3 kg 64.6 kg    Examination:  General exam: Appears calm and comfortable, chronically ill in appearance, scleral icterus, small bore feeding tube noted  Respiratory system: Clear to auscultation. Respiratory effort normal.  On room air Cardiovascular system: S1 & S2 heard, RRR. No JVD, murmurs, rubs, gallops or clicks. No pedal edema. Gastrointestinal system: Abdomen is nondistended, soft with slight tenderness  surrounding biliary drain site. No organomegaly or masses felt. Normal bowel sounds heard.  Biliary drain noted with dark bilious fluid present in bag Central nervous system: Alert and oriented. No focal neurological deficits. Extremities: Symmetric 5 x 5 power. Skin: Jaundice noted, otherwise no rashes, lesions or ulcers Psychiatry: Judgement and insight appear normal. Mood & affect appropriate.     Data Reviewed: I have personally reviewed following labs and imaging studies  CBC: Recent Labs  Lab 08/18/20 0541 08/19/20 0554 08/21/20 0554 08/22/20 0552 08/23/20 0606  WBC 13.6* 16.3* 13.7* 16.4* 15.9*  HGB 9.4* 11.4* 10.5* 11.8* 11.7*  HCT 26.4* 33.6* 30.7* 35.0* 34.0*  MCV 82.0 85.5 87.0 87.1 87.0  PLT 271 271 236 294 [id number]   Basic Metabolic Panel: Recent Labs  Lab 08/19/20 0554 08/20/20 0536 08/21/20 0554 08/21/20 1528 08/21/20 1948 08/22/20 0552 08/22/20 1718 08/23/20 0606  NA 157* 151* 146*  --   --  148*  --  150*  K 3.5 3.0* 3.1*  --   --  3.0*  --  3.3*  CL 120* 115* 110  --   --  110  --  114*  CO2 '28 26 27  '$ --   --  24  --  26  GLUCOSE 268* 264* 254*  --   --  341*  --  226*  BUN 29* 23 17  --   --  21  --  19  CREATININE 0.83 0.87 0.87  --   --  0.83  --  0.73  CALCIUM 9.9 9.1 9.1  --   --  9.9  --  9.7  MG  --   --  2.0  --   --  2.4  --   --   PHOS  --   --   --  3.0 2.9 2.2* 2.4*  --    GFR: Estimated Creatinine Clearance: 83 mL/min (by C-G formula based on SCr of 0.73 mg/dL). Liver Function Tests: Recent Labs  Lab 08/19/20 0554 08/20/20 0536 08/21/20 0554 08/22/20 0552 08/23/20 0606  AST 166* 118* 94* 94* 109*  ALT 188* 147* 128* 116* 112*  ALKPHOS 160* 133* 128* 140* 149*  BILITOT 20.4* 16.3* 15.5* 17.6* 18.7*  PROT 7.0 6.1* 6.2* 6.9 7.1  ALBUMIN 3.0* 2.5* 2.5* 2.7* 2.8*   No results for input(s): LIPASE, AMYLASE in the last 168 hours.  No results for input(s): AMMONIA in the last 168 hours. Coagulation Profile: No results for input(s):  INR, PROTIME in the last 168 hours.  Cardiac Enzymes: No results for input(s): CKTOTAL, CKMB, CKMBINDEX, TROPONINI in the last 168 hours. BNP (last 3 results) No results for input(s): PROBNP in the last 8760 hours. HbA1C: No results for input(s): HGBA1C in the last 72 hours. CBG: Recent Labs  Lab 08/22/20 0025 08/22/20 0429 08/22/20 0724 08/22/20 1107 08/23/20 0721  GLUCAP 197* 281* 340* 177* 250*   Lipid Profile: No results for input(s): CHOL, HDL, LDLCALC, TRIG, CHOLHDL, LDLDIRECT in the last 72 hours. Thyroid Function Tests: No results for input(s): TSH, T4TOTAL, FREET4, T3FREE, THYROIDAB in the last 72 hours. Anemia Panel: No results for input(s): VITAMINB12, FOLATE, FERRITIN, TIBC, IRON, RETICCTPCT in the last 72 hours. Sepsis Labs: No results for input(s): PROCALCITON, LATICACIDVEN in the last 168 hours.  Recent Results (from the past 240 hour(s))  SARS CORONAVIRUS 2 (TAT 6-24 HRS) Nasopharyngeal Nasopharyngeal Swab     Status: None   Collection Time: 08/14/20  6:04 PM   Specimen: Nasopharyngeal Swab  Result Value Ref Range Status   SARS Coronavirus 2 NEGATIVE NEGATIVE Final    Comment: (NOTE) SARS-CoV-2 target nucleic acids are NOT DETECTED.  The SARS-CoV-2 RNA is generally detectable in upper and lower respiratory specimens during the acute phase of infection. Negative results do not preclude SARS-CoV-2 infection, do not rule out co-infections with other pathogens, and should not be used as the sole basis for treatment or other patient management decisions. Negative results must be combined with clinical observations, patient history, and epidemiological information. The expected result is Negative.  Fact Sheet for Patients: SugarRoll.be  Fact Sheet for Healthcare Providers: https://www.woods-mathews.com/  This test is not yet approved or cleared by the [place] FDA and  has been authorized for detection and/or  diagnosis of SARS-CoV-2 by FDA under an Emergency Use Authorization (EUA). This EUA will remain  in effect (meaning this test can be used) for the duration of the COVID-19 declaration under Se ction 564(b)(1) of the Act, 21 U.S.C. section 360bbb-3(b)(1), unless the authorization is terminated or revoked sooner.  Performed at [facility], [place] [address] [place], [place] [address]   Body fluid culture w Gram Stain     Status: None (Preliminary result)   Collection Time: 08/22/20 11:12 AM   Specimen: BILE; Body Fluid  Result Value Ref Range Status   Specimen Description   Final    BILE Performed at [facility]  [address] [place], [place] [address]    Special Requests   Final    Normal Performed at [facility], [place] [address] [place], [place] [address]    Gram Stain   Final    NO WBC SEEN MODERATE YEAST FEW GRAM POSITIVE COCCI    Culture   Final    CULTURE REINCUBATED FOR BETTER GROWTH Performed at [facility], [place] [address], [place],  [address]    Report Status PENDING  Incomplete         Radiology Studies: DG Abd 1 View  Result Date: 08/22/2020 CLINICAL DATA:  Confirm NG tube placement EXAM: ABDOMEN - 1 VIEW COMPARISON:  None. FINDINGS: There is a percutaneous biliary stent in place. There is a weighted tip enteric tube coiled with tip overlying the stomach the lung bases are clear. Nonobstructive upper abdominal bowel gas pattern. IMPRESSION: Weighted tip enteric tube is coiled in the left upper quadrant overlying the stomach. Electronically Signed   By: Maurine Simmering   On: 08/22/2020 20:16   DG Abd 1 View  Result Date: 08/22/2020 CLINICAL DATA:  Pt has biliary obstruction from pancreatic cancer, nausea, vomiting. EXAM: ABDOMEN - 1 VIEW COMPARISON:  08/21/2020 FINDINGS: Biliary tube in unchanged position. The bowel gas pattern is normal. No radio-opaque calculi or other significant radiographic abnormality  are seen. IMPRESSION: Biliary tube in unchanged position. Electronically Signed   By: Kathreen Devoid   On: 08/22/2020 14:49   DG Abd 1 View  Result Date: 08/21/2020 CLINICAL DATA:  Few feeding tube placement EXAM: ABDOMEN - 1 VIEW COMPARISON:  None. FINDINGS: Soft feeding tube enters the stomach, with its tip in the fundus. Drainage catheter present in the right upper abdomen. IMPRESSION: Soft feeding tube tip in the fundus of the stomach. Electronically Signed   By: Nelson Chimes M.D.   On: 08/21/2020 12:18        Scheduled Meds:  amLODipine  5 mg Oral Daily   feeding supplement  1 Container Oral TID BM   feeding supplement (PROSource TF)  45 mL Per Tube TID   ferrous sulfate  325 mg Oral Q breakfast   free water  150 mL Per Tube Q4H   insulin aspart  0-15 Units Subcutaneous TID WC   insulin aspart  0-5 Units Subcutaneous QHS   insulin detemir  20 Units Subcutaneous Daily   nystatin  5 mL Oral QID   pantoprazole  40 mg Intravenous Q1400   rosuvastatin  5 mg Oral Weekly   sodium chloride flush  5 mL Intracatheter Q8H   sodium chloride flush  5 mL Intracatheter Q8H   Continuous Infusions:  feeding supplement (OSMOLITE 1.5 CAL) 35 mL/hr at 08/22/20 2345   potassium chloride 10 mEq (08/23/20 1020)      LOS: 8 days    Time spent: 39 minutes spent on chart review, discussion with nursing staff, consultants, updating family and interview/physical exam; more than 50% of that time was spent in counseling and/or coordination of care.    Shannan Slinker J [place] DO [facility] Available via Epic secure chat 7am-7pm After these hours, please refer to coverage provider listed on [email or web address] 08/23/2020, 11:08 AM

## 2020-08-23 NOTE — Progress Notes (Signed)
Referring Physician(s): Mansouraty,G  Supervising Physician: Dr. Dwaine Gale  Patient Status:  [facility] - In-pt  Chief Complaint: Pancreatic cancer with malignant obstructive jaundice   Subjective: Feels okay. Tolerating TF via NGT Denies much abd pain.  Allergies: Patient has no known allergies.  Medications:  Current Facility-Administered Medications:    amLODipine (NORVASC) tablet 5 mg, 5 mg, Oral, Daily, Rise Patience, MD, 5 mg at 08/23/20 0846   feeding supplement (BOOST / RESOURCE BREEZE) liquid 1 Container, 1 Container, Oral, TID BM, Ezekiel Slocumb, DO, 1 Container at 08/21/20 2150   feeding supplement (OSMOLITE 1.5 CAL) liquid 1,000 mL, 1,000 mL, Per Tube, Continuous, [place] Donnamarie Poag, DO, Last Rate: 35 mL/hr at 08/22/20 2345, Restarted at 08/22/20 2345   feeding supplement (PROSource TF) liquid 45 mL, 45 mL, Per Tube, TID, [place] Donnamarie Poag, DO, 45 mL at 08/23/20 0846   ferrous sulfate tablet 325 mg, 325 mg, Oral, Q breakfast, Rise Patience, MD, 325 mg at 08/23/20 0846   free water 150 mL, 150 mL, Per Tube, Q4H, [place] Donnamarie Poag, DO, 150 mL at 08/23/20 0847   hydrALAZINE (APRESOLINE) injection 10 mg, 10 mg, Intravenous, Q4H PRN, Rise Patience, MD, 10 mg at 08/22/20 2157   insulin aspart (novoLOG) injection 0-15 Units, 0-15 Units, Subcutaneous, TID WC, [place] Donnamarie Poag, DO, 5 Units at 08/23/20 0845   insulin aspart (novoLOG) injection 0-5 Units, 0-5 Units, Subcutaneous, QHS, [place] Eric J, DO   insulin detemir (LEVEMIR) injection 20 Units, 20 Units, Subcutaneous, Daily, [place] Donnamarie Poag, DO, 20 Units at 08/22/20 1338   morphine 2 MG/ML injection 2 mg, 2 mg, Intravenous, Q2H PRN, [place] Donnamarie Poag, DO, 2 mg at 08/20/20 2221   nystatin (MYCOSTATIN) 100000 UNIT/ML suspension 500,000 Units, 5 mL, Oral, QID, Willia Craze, NP, 500,000 Units at 08/23/20 0846   ondansetron (ZOFRAN) injection 4 mg, 4 mg, Intravenous, Q6H PRN, Willia Craze, NP   oxyCODONE (Oxy  IR/ROXICODONE) immediate release tablet 10 mg, 10 mg, Oral, Q6H PRN, [place] Donnamarie Poag, DO, 10 mg at 08/22/20 1628   pantoprazole (PROTONIX) injection 40 mg, 40 mg, Intravenous, Q1400, Willia Craze, NP   potassium chloride 10 mEq in 100 mL IVPB, 10 mEq, Intravenous, Q1 Hr x 6, [place] Eric J, DO, Last Rate: 100 mL/hr at 08/23/20 1020, 10 mEq at 08/23/20 1020   rosuvastatin (CRESTOR) tablet 5 mg, 5 mg, Oral, Weekly, Rise Patience, MD, 5 mg at 08/18/20 [id number]   sodium chloride flush (NS) 0.9 % injection 5 mL, 5 mL, Intracatheter, Q8H, Sandi Mariscal, MD, 5 mL at 08/22/20 1400   sodium chloride flush (NS) 0.9 % injection 5 mL, 5 mL, Intracatheter, Q8H, Criselda Peaches, MD, 5 mL at 08/23/20 0615    Vital Signs: BP (!) 159/58   Pulse (!) 106   Temp 99.3 F (37.4 C) (Oral)   Resp 18   Ht '5\' 10"'$  (1.778 m)   Wt 64.6 kg   SpO2 100%   BMI 20.43 kg/m   Physical Exam  Awake and alert. Biliary drain intact, site clean, dry, mildly tender to palpation. Output recorded about 150 mL past 24 hrs, though there is about 75 mL in bag now. Tube flushes easily with prompt return of bilious fluid.  Imaging: DG Abd 1 View  Result Date: 08/22/2020 CLINICAL DATA:  Confirm NG tube placement EXAM: ABDOMEN - 1 VIEW COMPARISON:  None. FINDINGS: There is a percutaneous biliary stent in place. There is a weighted  tip enteric tube coiled with tip overlying the stomach the lung bases are clear. Nonobstructive upper abdominal bowel gas pattern. IMPRESSION: Weighted tip enteric tube is coiled in the left upper quadrant overlying the stomach. Electronically Signed   By: Maurine Simmering   On: 08/22/2020 20:16   DG Abd 1 View  Result Date: 08/22/2020 CLINICAL DATA:  Pt has biliary obstruction from pancreatic cancer, nausea, vomiting. EXAM: ABDOMEN - 1 VIEW COMPARISON:  08/21/2020 FINDINGS: Biliary tube in unchanged position. The bowel gas pattern is normal. No radio-opaque calculi or other significant radiographic  abnormality are seen. IMPRESSION: Biliary tube in unchanged position. Electronically Signed   By: Kathreen Devoid   On: 08/22/2020 14:49   DG Abd 1 View  Result Date: 08/21/2020 CLINICAL DATA:  Few feeding tube placement EXAM: ABDOMEN - 1 VIEW COMPARISON:  None. FINDINGS: Soft feeding tube enters the stomach, with its tip in the fundus. Drainage catheter present in the right upper abdomen. IMPRESSION: Soft feeding tube tip in the fundus of the stomach. Electronically Signed   By: Nelson Chimes M.D.   On: 08/21/2020 12:18    Labs:  CBC: Recent Labs    08/19/20 0554 08/21/20 0554 08/22/20 0552 08/23/20 0606  WBC 16.3* 13.7* 16.4* 15.9*  HGB 11.4* 10.5* 11.8* 11.7*  HCT 33.6* 30.7* 35.0* 34.0*  PLT 271 236 294 294     COAGS: Recent Labs    08/14/20 1934 08/15/20 0452 08/16/20 0553  INR 1.7* 1.5* 1.1     BMP: Recent Labs    08/20/20 0536 08/21/20 0554 08/22/20 0552 08/23/20 0606  NA 151* 146* 148* 150*  K 3.0* 3.1* 3.0* 3.3*  CL 115* 110 110 114*  CO2 '26 27 24 26  '$ GLUCOSE 264* 254* 341* 226*  BUN '23 17 21 19  '$ CALCIUM 9.1 9.1 9.9 9.7  CREATININE 0.87 0.87 0.83 0.73  GFRNONAA >60 >60 >60 >60     LIVER FUNCTION TESTS: Recent Labs    08/20/20 0536 08/21/20 0554 08/22/20 0552 08/23/20 0606  BILITOT 16.3* 15.5* 17.6* 18.7*  AST 118* 94* 94* 109*  ALT 147* 128* 116* 112*  ALKPHOS 133* 128* 140* 149*  PROT 6.1* 6.2* 6.9 7.1  ALBUMIN 2.5* 2.5* 2.7* 2.8*     Assessment and Plan: Patient with history of pancreatic cancer with malignant biliary obstruction, status post external biliary drain placement on 7/21 and conversion to internal and external biliary drain on 7/22.   Drain functional, no concerns for obstruction when flushing/aspirating T. Bili trending back up, now 18.7 Will bring pt down for cholangiogram just to confirm proper tube position and function. Discussed with pt.   Electronically Signed: Ascencion Dike, PA-C 08/23/2020, 10:24 AM   I spent a  total of 15 Minutes at the the patient's bedside AND on the patient's hospital floor or unit, greater than 50% of which was counseling/coordinating care for biliary drain

## 2020-08-23 NOTE — Progress Notes (Signed)
OT Cancellation Note  Patient Details Name: Jeff Jordan MRN: [id number] DOB: 06-Jun-1955   Cancelled Treatment:    Reason Eval/Treat Not Completed: Patient at procedure or test/ unavailable patient is off the floor at radiation. Will check back as schedule allows Jackelyn Poling OTR/L, MS Acute Rehabilitation Department Office# [phone] Pager# [phone]   [place] 08/23/2020, 2:44 PM

## 2020-08-24 ENCOUNTER — Inpatient Hospital Stay (HOSPITAL_COMMUNITY): Payer: Medicare HMO

## 2020-08-24 DIAGNOSIS — R112 Nausea with vomiting, unspecified: Secondary | ICD-10-CM | POA: Diagnosis not present

## 2020-08-24 DIAGNOSIS — C259 Malignant neoplasm of pancreas, unspecified: Secondary | ICD-10-CM | POA: Diagnosis not present

## 2020-08-24 DIAGNOSIS — K831 Obstruction of bile duct: Secondary | ICD-10-CM | POA: Diagnosis not present

## 2020-08-24 DIAGNOSIS — E440 Moderate protein-calorie malnutrition: Secondary | ICD-10-CM | POA: Diagnosis not present

## 2020-08-24 LAB — CBC
HCT: 31.4 % — ABNORMAL LOW (ref 39.0–52.0)
Hemoglobin: 10.7 g/dL — ABNORMAL LOW (ref 13.0–17.0)
MCH: 30 pg (ref 26.0–34.0)
MCHC: 34.1 g/dL (ref 30.0–36.0)
MCV: 88 fL (ref 80.0–100.0)
Platelets: 259 10*3/uL (ref 150–400)
RBC: 3.57 MIL/uL — ABNORMAL LOW (ref 4.22–5.81)
RDW: 24.7 % — ABNORMAL HIGH (ref 11.5–15.5)
WBC: 16.1 10*3/uL — ABNORMAL HIGH (ref 4.0–10.5)
nRBC: 0.1 % (ref 0.0–0.2)

## 2020-08-24 LAB — GLUCOSE, CAPILLARY
Glucose-Capillary: 135 mg/dL — ABNORMAL HIGH (ref 70–99)
Glucose-Capillary: 137 mg/dL — ABNORMAL HIGH (ref 70–99)
Glucose-Capillary: 141 mg/dL — ABNORMAL HIGH (ref 70–99)
Glucose-Capillary: 144 mg/dL — ABNORMAL HIGH (ref 70–99)
Glucose-Capillary: 160 mg/dL — ABNORMAL HIGH (ref 70–99)
Glucose-Capillary: 167 mg/dL — ABNORMAL HIGH (ref 70–99)
Glucose-Capillary: 168 mg/dL — ABNORMAL HIGH (ref 70–99)
Glucose-Capillary: 197 mg/dL — ABNORMAL HIGH (ref 70–99)
Glucose-Capillary: 201 mg/dL — ABNORMAL HIGH (ref 70–99)
Glucose-Capillary: 247 mg/dL — ABNORMAL HIGH (ref 70–99)

## 2020-08-24 LAB — COMPREHENSIVE METABOLIC PANEL
ALT: 95 U/L — ABNORMAL HIGH (ref 0–44)
AST: 95 U/L — ABNORMAL HIGH (ref 15–41)
Albumin: 2.5 g/dL — ABNORMAL LOW (ref 3.5–5.0)
Alkaline Phosphatase: 134 U/L — ABNORMAL HIGH (ref 38–126)
Anion gap: 11 (ref 5–15)
BUN: 14 mg/dL (ref 8–23)
CO2: 24 mmol/L (ref 22–32)
Calcium: 9.3 mg/dL (ref 8.9–10.3)
Chloride: 115 mmol/L — ABNORMAL HIGH (ref 98–111)
Creatinine, Ser: 0.68 mg/dL (ref 0.61–1.24)
GFR, Estimated: >60 mL/min (ref >60)
Glucose, Bld: 229 mg/dL — ABNORMAL HIGH (ref 70–99)
Potassium: 3.6 mmol/L (ref 3.5–5.1)
Sodium: 150 mmol/L — ABNORMAL HIGH (ref 135–145)
Total Bilirubin: 16 mg/dL — ABNORMAL HIGH (ref 0.3–1.2)
Total Protein: 6.5 g/dL (ref 6.5–8.1)

## 2020-08-24 LAB — MAGNESIUM: Magnesium: 2.1 mg/dL (ref 1.7–2.4)

## 2020-08-24 MED ORDER — CHOLESTYRAMINE LIGHT 4 G PO PACK
4.0000 g | PACK | Freq: Two times a day (BID) | ORAL | Status: DC
  Administered 2020-08-24 – 2020-09-06 (×20): 4 g via ORAL
  Filled 2020-08-24 (×27): qty 1

## 2020-08-24 MED ORDER — VANCOMYCIN HCL IN DEXTROSE 1-5 GM/200ML-% IV SOLN: 1000.0000 mg | INTRAVENOUS | Status: AC

## 2020-08-24 MED ORDER — INSULIN DETEMIR 100 UNIT/ML ~~LOC~~ SOLN
40.0000 [IU] | Freq: Every day | SUBCUTANEOUS | Status: DC
  Administered 2020-08-24 – 2020-08-25 (×2): 40 [IU] via SUBCUTANEOUS
  Filled 2020-08-24 (×2): qty 0.4

## 2020-08-24 MED ORDER — FLUCONAZOLE 100MG IVPB
100.0000 mg | INTRAVENOUS | Status: DC
Start: 2020-08-24 — End: 2020-08-26
  Administered 2020-08-24 – 2020-08-25 (×2): 100 mg via INTRAVENOUS
  Filled 2020-08-24 (×5): qty 50

## 2020-08-24 MED ORDER — INSULIN ASPART 100 UNIT/ML IJ SOLN
2.0000 [IU] | INTRAMUSCULAR | Status: DC
  Administered 2020-08-24 – 2020-08-25 (×7): 2 [IU] via SUBCUTANEOUS

## 2020-08-24 MED ORDER — FREE WATER
200.0000 mL | Status: DC
  Administered 2020-08-24 – 2020-08-26 (×11): 200 mL

## 2020-08-24 MED ORDER — CEFTRIAXONE SODIUM 2 G IJ SOLR
2.0000 g | INTRAMUSCULAR | Status: DC
Start: 2020-08-24 — End: 2020-08-26
  Administered 2020-08-24 – 2020-08-25 (×2): 2 g via INTRAVENOUS
  Filled 2020-08-24 (×2): qty 20

## 2020-08-24 NOTE — Progress Notes (Addendum)
Progress Note  Chief Complaint:    biliary obstruction / duodenal stenosis , pancreatic cancer, thrush     Attending physician's note   I have taken an interval history, reviewed the chart and examined the patient. I agree with the Advanced Practitioner's note, impression and recommendations.    He had biliary drain flushed by IR, no evidence of obstruction on cholangiogram. Bilirubin trended down this morning Continue tube feeds Follow-up with oncology  GI is available if needed, please call with any questions.   GI will sign off  I have spent 25 minutes of patient care (this includes precharting, chart review, review of results, face-to-face time used for counseling as well as treatment plan and follow-up. The patient was provided an opportunity to ask questions and all were answered. The patient agreed with the plan and demonstrated an understanding of the instructions.  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 He is s/p percutaneous drain placement with subsequent internalization on 7/22 ( ERCP couldn't be done due to duodenal stenosis). Bilirubin was on upward trend. Yesterday IR flushed drain and performed a cholangiogram which showed no drain problems. Bilirubin down today from 18.7 to 16.  --Continue small bowel enteral feedings.  --He has established care with Oncology and sounds like plan is for eventual chemotherapy  # Persistent leukocytosis, WBC 16.1. Unclear etiology.  No source of infection. Completed course of Cipro / flagyl.    # Anemia, normocytic. Overall hgb is stable  at 10.7    --Continue oral iron   # Hypernatremia.  Free water flushes increased yesterday but Na+ remains at 150.    # Possible oral candidiasis. Thick white coating on tongue which could be due in part to Vina Con ( if tablets while) since he is  vomiting some of the oral medications. --Continue Nystatin swish and swallow ( on Day 3) for 10 days.    DIAGNOSTIC STUDIES   Cholangiogram IMPRESSION: Fluoroscopically cholangiogram confirmed appropriate positioning and function of internal external biliary drain.     SUBJECTIVE   Feels okay. No nausea / vomiting. Up brushing teeth       OBJECTIVE      Scheduled inpatient medications:   amLODipine  5 mg Oral Daily   feeding supplement  1 Container Oral TID BM   feeding supplement (PROSource TF)  45 mL Per Tube TID   ferrous sulfate  325 mg Oral Q breakfast   free water  200 mL Per Tube Q4H   insulin aspart  0-15 Units Subcutaneous TID WC   insulin aspart  0-5 Units Subcutaneous QHS   insulin detemir  40 Units Subcutaneous Daily   nystatin  5 mL Oral QID   pantoprazole  40 mg Intravenous Q1400   rosuvastatin  5 mg Oral Weekly   sodium chloride flush  5 mL Intracatheter Q8H   sodium chloride flush  5 mL Intracatheter Q8H   Continuous inpatient infusions:   feeding supplement (OSMOLITE 1.5 CAL) 35 mL/hr at 08/22/20 2345   PRN inpatient medications: hydrALAZINE, morphine injection, ondansetron (ZOFRAN) IV, oxyCODONE  Vital signs in last 24 hours: Temp:  [98.2 F (36.8 C)-98.7 F (37.1 C)] 98.7 F (37.1 C) (07/29 0430) Pulse Rate:  [106-108] 106 (07/29 0430) Resp:  [14-18] 18 (07/29 0430) BP: (108-176)/(60-84) 176/70 (07/29 0430) SpO2:  [100 %] 100 % (07/29 0430)  Weight:  [65.6 kg] 65.6 kg (07/29 0500) Last BM Date: 08/24/20  Intake/Output Summary (Last 24 hours) at 08/24/2020 1044 Last data filed at 08/24/2020 0600 Gross per 24 hour  Intake 1033.15 ml  Output 780 ml  Net 253.15 ml     Physical Exam:  General: Alert thin male in NAD Heart:  Regular rate and rhythm. No lower extremity edema Pulmonary: Normal respiratory effort Abdomen: Soft, nondistended, nontender. Normal bowel sounds.  Neurologic: Alert and oriented Psych: Pleasant. Cooperative.    Filed Weights   08/22/20 0500 08/23/20 0500 08/24/20 0500  Weight: 64.3 kg 64.6 kg 65.6 kg    Intake/Output from previous day: 07/28 0701 - 07/29 0700 In: 1033.2 [NG/GT:650; IV Piggyback:383.2] Out: 780 [Urine:400; Drains:380] Intake/Output this shift: No intake/output data recorded.    Lab Results: Recent Labs    08/22/20 0552 08/23/20 0606 08/24/20 0634  WBC 16.4* 15.9* 16.1*  HGB 11.8* 11.7* 10.7*  HCT 35.0* 34.0* 31.4*  PLT 294 294 259   BMET Recent Labs    08/22/20 0552 08/23/20 0606 08/24/20 0634  NA 148* 150* 150*  K 3.0* 3.3* 3.6  CL 110 114* 115*  CO2 '24 26 24  '$ GLUCOSE 341* 226* 229*  BUN '21 19 14  '$ CREATININE 0.83 0.73 0.68  CALCIUM 9.9 9.7 9.3   LFT Recent Labs    08/24/20 0634  PROT 6.5  ALBUMIN 2.5*  AST 95*  ALT 95*  ALKPHOS 134*  BILITOT 16.0*   PT/INR No results for input(s): LABPROT, INR in the last 72 hours. Hepatitis Panel No results for input(s): HEPBSAG, HCVAB, HEPAIGM, HEPBIGM in the last 72 hours.  DG Abd 1 View  Result Date: 08/22/2020 CLINICAL DATA:  Confirm NG tube placement EXAM: ABDOMEN - 1 VIEW COMPARISON:  None. FINDINGS: There is a percutaneous biliary stent in place. There is a weighted tip enteric tube coiled with tip overlying the stomach the lung bases are clear. Nonobstructive upper abdominal bowel gas pattern. IMPRESSION: Weighted tip enteric tube is coiled in the left upper quadrant overlying the stomach. Electronically Signed   By: Maurine Simmering   On: 08/22/2020 20:16   DG Abd 1 View  Result Date: 08/22/2020 CLINICAL DATA:  Pt has biliary obstruction from pancreatic cancer, nausea, vomiting. EXAM: ABDOMEN - 1 VIEW COMPARISON:  08/21/2020 FINDINGS: Biliary tube in unchanged position. The bowel gas pattern is normal. No radio-opaque calculi or other significant radiographic abnormality are seen. IMPRESSION: Biliary tube in unchanged position. Electronically Signed   By: Kathreen Devoid   On: 08/22/2020 14:49   IR  CHOLANGIOGRAM EXISTING TUBE  Result Date: 08/23/2020 INDICATION: 65 year old gentleman with history of metastatic pancreatic cancer status post external biliary drain placement on 08/16/2020 followed by conversion to internal external biliary drain on 08/17/2020 returns to interventional radiology given rising bilirubin. EXAM: Fluoroscopic biliary drain check MEDICATIONS: None ANESTHESIA/SEDATION: None FLUOROSCOPY TIME:  Fluoroscopy Time: 0 minutes 30 seconds (4 mGy). COMPLICATIONS: None immediate. PROCEDURE: Informed written consent was obtained from the patient after a thorough discussion of the procedural risks, benefits and alternatives. Patient positioned supine on the procedure table. Scout image demonstrated the internal external biliary drain in appropriate unchanged position. Contrast administered through the drain confirmed patency. Drain flushed and reattached to bag. IMPRESSION: Fluoroscopically cholangiogram confirmed appropriate positioning and function of internal external biliary drain. Electronically Signed   By: Miachel Roux M.D.   On: 08/23/2020 16:21        Principal Problem:   Biliary obstruction Active Problems:  Pancreatic adenocarcinoma ([place])   Diabetes mellitus type 2 in nonobese [facility])   Essential hypertension   Non-intractable vomiting   Malnutrition of moderate degree     LOS: 9 days   Tye Savoy ,NP 08/24/2020, 10:44 AM

## 2020-08-24 NOTE — Progress Notes (Addendum)
Referring Physician(s): Mansouraty,G  Supervising Physician: Suttle,D  Patient Status:  [facility] - In-pt  Chief Complaint: Pancreatic cancer with malignant obstructive jaundice, intermittent nausea /vomiting, malnutrition, poor oral intake, duodenal stenosis   Subjective: Patient without new complaints.  NG tube remains in place.  Has occasional nausea.  Still appears weak.  Mild tenderness around biliary drain site.  Occasional loose stool. Past Medical History:  Diagnosis Date   Biliary obstruction    Essential hypertension    Gastroesophageal reflux    Hepatic steatosis    Mild hyperlipidemia    Type 2 diabetes mellitus [facility])    Past Surgical History:  Procedure Laterality Date   BIOPSY  08/14/2020   Procedure: BIOPSY;  Surgeon: Rush Landmark Telford Nab., MD;  Location: [facility];  Service: Gastroenterology;;   CATARACT EXTRACTION Bilateral    ENDOSCOPIC RETROGRADE CHOLANGIOPANCREATOGRAPHY (ERCP) WITH PROPOFOL N/A 08/14/2020   Procedure: ENDOSCOPIC RETROGRADE CHOLANGIOPANCREATOGRAPHY (ERCP) WITH PROPOFOL;  Surgeon: Irving Copas., MD;  Location: [facility];  Service: Gastroenterology;  Laterality: N/A;  aborted ercp   ESOPHAGOGASTRODUODENOSCOPY N/A 08/14/2020   Procedure: ESOPHAGOGASTRODUODENOSCOPY (EGD);  Surgeon: Irving Copas., MD;  Location: [facility];  Service: Gastroenterology;  Laterality: N/A;   EUS  08/14/2020   Procedure: UPPER ENDOSCOPIC ULTRASOUND (EUS) LINEAR;  Surgeon: Irving Copas., MD;  Location: [facility];  Service: Gastroenterology;;   FINE NEEDLE ASPIRATION  08/14/2020   Procedure: FINE NEEDLE ASPIRATION (FNA) LINEAR;  Surgeon: Irving Copas., MD;  Location: [facility];  Service: Gastroenterology;;   IR BILIARY DRAIN PLACEMENT WITH CHOLANGIOGRAM  08/16/2020   IR CHOLANGIOGRAM EXISTING TUBE  08/23/2020   IR CONVERT BILIARY DRAIN TO INT EXT BILIARY DRAIN  08/17/2020   NECK SURGERY      Allergies: Patient has no  known allergies.  Medications: Prior to Admission medications   Medication Sig Start Date End Date Taking? Authorizing Provider  amLODipine (NORVASC) 5 MG tablet Take 5 mg by mouth daily. 06/19/20  Yes [provider]  aspirin 81 MG EC tablet Take 81 mg by mouth daily.   Yes [provider]  ferrous sulfate 325 (65 FE) MG tablet Take 325 mg by mouth daily with breakfast.   Yes [provider]  JARDIANCE 25 MG TABS tablet Take 25 mg by mouth daily. 07/23/20  Yes [provider]  loperamide (IMODIUM) 2 MG capsule Take by mouth as needed for diarrhea or loose stools.   Yes [provider]  metFORMIN (GLUCOPHAGE) 1000 MG tablet Take 1,000 mg by mouth 2 (two) times daily. 07/16/20  Yes [provider]  olmesartan (BENICAR) 40 MG tablet Take 40 mg by mouth daily. 06/25/20  Yes [provider]  omeprazole (PRILOSEC) 40 MG capsule Take 40 mg by mouth every morning. 03/01/20  Yes [provider]  ondansetron (ZOFRAN) 4 MG tablet Take 1 tablet (4 mg total) by mouth every 4 (four) hours as needed for nausea. 08/02/20  Yes Dayton Scrape A, NP  Oxycodone HCl 10 MG TABS Take 1 tablet (10 mg total) by mouth every 4 (four) hours as needed. Patient taking differently: Take 10 mg by mouth every 4 (four) hours as needed (pain). 08/02/20  Yes Dayton Scrape A, NP  pioglitazone (ACTOS) 45 MG tablet Take 45 mg by mouth daily. 06/25/20  Yes [provider]  rosuvastatin (CRESTOR) 5 MG tablet Take 5 mg by mouth once a week. 06/25/20  Yes [provider]     Vital Signs: BP (!) 156/63 (BP Location:  Right Arm)   Pulse 100   Temp (!) 97.4 F (36.3 C) (Oral)   Resp 16   Ht '5\' 10"'$  (1.778 m)   Wt 144 lb 10 oz (65.6 kg)   SpO2 100%   BMI 20.75 kg/m   Physical Exam awake, alert.  Chest clear to auscultation bilaterally.  Heart with regular rate and rhythm.  Abdomen soft, positive bowel sounds, right biliary drain intact, insertion  site okay, mildly tender, output today 200 cc yellow bile, drain flushed without difficulty, no lower extremity edema  Imaging: DG Abd 1 View  Result Date: 08/22/2020 CLINICAL DATA:  Confirm NG tube placement EXAM: ABDOMEN - 1 VIEW COMPARISON:  None. FINDINGS: There is a percutaneous biliary stent in place. There is a weighted tip enteric tube coiled with tip overlying the stomach the lung bases are clear. Nonobstructive upper abdominal bowel gas pattern. IMPRESSION: Weighted tip enteric tube is coiled in the left upper quadrant overlying the stomach. Electronically Signed   By: Maurine Simmering   On: 08/22/2020 20:16   DG Abd 1 View  Result Date: 08/22/2020 CLINICAL DATA:  Pt has biliary obstruction from pancreatic cancer, nausea, vomiting. EXAM: ABDOMEN - 1 VIEW COMPARISON:  08/21/2020 FINDINGS: Biliary tube in unchanged position. The bowel gas pattern is normal. No radio-opaque calculi or other significant radiographic abnormality are seen. IMPRESSION: Biliary tube in unchanged position. Electronically Signed   By: Kathreen Devoid   On: 08/22/2020 14:49   DG Abd 1 View  Result Date: 08/21/2020 CLINICAL DATA:  Few feeding tube placement EXAM: ABDOMEN - 1 VIEW COMPARISON:  None. FINDINGS: Soft feeding tube enters the stomach, with its tip in the fundus. Drainage catheter present in the right upper abdomen. IMPRESSION: Soft feeding tube tip in the fundus of the stomach. Electronically Signed   By: Nelson Chimes M.D.   On: 08/21/2020 12:18   IR CHOLANGIOGRAM EXISTING TUBE  Result Date: 08/23/2020 INDICATION: 65 year old gentleman with history of metastatic pancreatic cancer status post external biliary drain placement on 08/16/2020 followed by conversion to internal external biliary drain on 08/17/2020 returns to interventional radiology given rising bilirubin. EXAM: Fluoroscopic biliary drain check MEDICATIONS: None ANESTHESIA/SEDATION: None FLUOROSCOPY TIME:  Fluoroscopy Time: 0 minutes 30 seconds (4 mGy).  COMPLICATIONS: None immediate. PROCEDURE: Informed written consent was obtained from the patient after a thorough discussion of the procedural risks, benefits and alternatives. Patient positioned supine on the procedure table. Scout image demonstrated the internal external biliary drain in appropriate unchanged position. Contrast administered through the drain confirmed patency. Drain flushed and reattached to bag. IMPRESSION: Fluoroscopically cholangiogram confirmed appropriate positioning and function of internal external biliary drain. Electronically Signed   By: Miachel Roux M.D.   On: 08/23/2020 16:21    Labs:  CBC: Recent Labs    08/21/20 0554 08/22/20 0552 08/23/20 0606 08/24/20 0634  WBC 13.7* 16.4* 15.9* 16.1*  HGB 10.5* 11.8* 11.7* 10.7*  HCT 30.7* 35.0* 34.0* 31.4*  PLT 236 294 294 259    COAGS: Recent Labs    08/14/20 1934 08/15/20 0452 08/16/20 0553  INR 1.7* 1.5* 1.1    BMP: Recent Labs    08/21/20 0554 08/22/20 0552 08/23/20 0606 08/24/20 0634  NA 146* 148* 150* 150*  K 3.1* 3.0* 3.3* 3.6  CL 110 110 114* 115*  CO2 '27 24 26 24  '$ GLUCOSE 254* 341* 226* 229*  BUN '17 21 19 14  '$ CALCIUM 9.1 9.9 9.7 9.3  CREATININE 0.87 0.83 0.73 0.68  GFRNONAA >60 >60 >  60 >60    LIVER FUNCTION TESTS: Recent Labs    08/21/20 0554 08/22/20 0552 08/23/20 0606 08/24/20 0634  BILITOT 15.5* 17.6* 18.7* 16.0*  AST 94* 94* 109* 95*  ALT 128* 116* 112* 95*  ALKPHOS 128* 140* 149* 134*  PROT 6.2* 6.9 7.1 6.5  ALBUMIN 2.5* 2.7* 2.8* 2.5*    Assessment and Plan: Patient with history of pancreatic cancer with malignant biliary obstruction, status post external biliary drain placement on 7/21 and conversion to internal and external biliary drain on 7/22.  Patient unable to undergo ERCP due to duodenal stenosis; now with persistent minimal oral intake, NG tube in place, intermittent nausea/vomiting.  Afebrile, drain output 200 cc today, drain flushes without difficulty, follow-up  cholangiogram yesterday revealed appropriate positioning of catheter; WBC 16.1 up from 15.9, hemoglobin 10.7, platelets normal, potassium normal, total bilirubin 16 down from 18.7, bile cultures with moderate staph epidermidis and moderate Candida glabrata; request received from primary care team for gastrostomy/gastrojejunostomy tube placement.  Imaging studies were reviewed today by Dr. Pascal Lux.  Anatomy appears amenable to tube placement.Risks and benefits image guided gastrostomy tube placement was discussed with the patient including, but not limited to the need for a barium enema during the procedure, bleeding, infection, peritonitis and/or damage to adjacent structures.  All of the patient's questions were answered, patient is agreeable to proceed.  Consent signed and in chart.  We will tentatively plan case for 8/1.    Electronically Signed: D. Rowe Robert, PA-C 08/24/2020, 4:26 PM   I spent a total of 20 minutes at the the patient's bedside AND on the patient's hospital floor or unit, greater than 50% of which was counseling/coordinating care for biliary drain; possible gastrostomy versus gastrojejunostomy tube placement    Patient ID: Jeff Jordan, male   DOB: [birth year], 65 y.o.   MRN: [id number]

## 2020-08-24 NOTE — Progress Notes (Signed)
PT Cancellation Note  Patient Details Name: Jeff Jordan MRN: [id number] DOB: 01-18-1956   Cancelled Treatment:     pt resting comfortably not easily aroused.  Late in afternoon.  Pt did work with OT earlier today.  Will continue to follow during his acute care stay.   Nathanial Rancher 08/24/2020, 3:11 PM

## 2020-08-24 NOTE — Progress Notes (Signed)
Inpatient Diabetes Program Recommendations  AACE/ADA: New Consensus Statement on Inpatient Glycemic Control (2015)  Target Ranges:  Prepandial:   less than 140 mg/dL      Peak postprandial:   less than 180 mg/dL (1-2 hours)      Critically ill patients:  140 - 180 mg/dL   Lab Results  Component Value Date   GLUCAP 201 (H) 08/24/2020    Review of Glycemic Control  Current orders for Inpatient glycemic control:   Lantus 40 units QD Novolog 0-15 units TID with meals and 0-5   Osmolite 1.5 at 55/H Nutritional drinks + CL diet  Inpatient Diabetes Program Recommendations:    Add Novolog 2 units Q4H for TF coverage. Do not give if TF Held for any reason.  When pt is discharged, daughter will need to be taught how to use insulin pen.  Continue to follow.  Thank you. Lorenda Peck, RD, LDN, CDE Inpatient Diabetes Coordinator [phone]

## 2020-08-24 NOTE — Progress Notes (Signed)
PROGRESS NOTE    Jeff Jordan  [id number] DOB: 1955/06/06 DOA: 08/14/2020 PCP: Georganna Skeans, PA-C    Brief Narrative:  Jeff Jordan is a 65 year old male with past medical history significant for essential hypertension, type 2 diabetes mellitus and recent diagnosis of pancreatic adenocarcinoma initially was seen by gastroenterology, Dr. Rush Landmark for biliary stent placement for obstruction and underwent ERCP on 7/19 with inability to place a stent and was subsequently directly admitted to the hospitalist service for plans of IR intervention.   Assessment & Plan:   Principal Problem:   Biliary obstruction Active Problems:   Pancreatic adenocarcinoma (HCC)   Diabetes mellitus type 2 in nonobese [facility])   Essential hypertension   Non-intractable vomiting   Malnutrition of moderate degree   Pancreatic adenocarcinoma complicated by biliary obstruction Patient was initially referred to gastroenterology, Dr. Rush Landmark for ERCP and stent placement on 7/19 that was unsuccessful and patient was subsequently admitted to the hospital service for IR intervention.  Patient underwent IR external biliary drain on 7/21 and conversion to internal and external biliary drain on 7/22.  IR cholangiogram drain track on 7/28 shows appropriate positioning and functioning of biliary drain. --Drain output 348m past 24 hours --AST 163>>287>>166>118>94>94 --ALT 115>213>>188>147>128>116 --Tbili 18.0>>26.8>>20.4>16.3>15.5>17.6>18.7>16.0 --pending initial oncology evaluation[facility] --Oxycodone 10 mg p.o. every 6 hours as needed moderate pain --Morphine 2 mg IV every 2 hours as needed severe breakthrough pain --Continue to monitor drain output --CMP daily  Hypernatremia Etiology likely secondary to poor oral intake in the setting of pancreatic adenocarcinoma and biliary obstruction. --Na 142>>159>157>151>146>148>150>150 --Increase free water flushes to 200 mL every  4 hours today --repeat CMP in am  Adult failure to thrive Patient continues with poor oral intake in the setting of pancreatic adenocarcinoma with biliary obstruction.  Biliary obstruction improving; although with very little oral intake and nausea/vomiting with solid food. --Continues on tube feeds via small bore feeding tube which was placed on 7/26 --Continue to closely monitor intake --Continues with very little oral intake, consult IR today for consideration of G-tube placement  Leukocytosis CT chest with contrast with no consolidative process.  CT pelvis with contrast with no signs of infectious process.  Unclear etiology of leukocytosis, no infectious source identified.  Suspect possible hemoconcentration in the setting of poor oral intake and dehydration.  Completed course of ciprofloxacin and Flagyl. --WBC 5.7>>11.7>13.6>16.3>13.7>16.4>15.9>16.1 --CBC daily  Type 2 diabetes mellitus Outpatient regimen includes Jardiance 25 mg p.o. daily, metformin 1000 mg p.o. twice daily, Actos 45 mg p.o. daily.  Blood sugars elevated during hospitalization due to D5W needed for hypernatremia; but now will DC today. --Increase Lantus to 40 units [place] daily --Novolog 2u q4h while on tube feeds --Sensitive SSI for coverage --CBGs before every meal/at bedtime  Essential hypertension --Amlodipine 5 mg p.o. daily --Hydralazine IV as needed  HLD: Crestor 5 mg p.o. weekly  GERD: Protonix 40 mg IV daily  Thrush: Nystatin swish and swallow  Diarrhea Etiology likely secondary to initiation of tube feeds versus bile acid. --Prevalite twice daily   DVT prophylaxis: SCDs Start: 08/14/20 1624   Code Status: Full Code Family Communication: No family present at bedside this morning  Disposition Plan:  Level of care: Med-Surg Status is: Inpatient  Remains inpatient appropriate because:Unsafe d/c plan, IV treatments appropriate due to intensity of illness or inability to take PO, and Inpatient level  of care appropriate due to severity of illness  Dispo: The patient is from: Home  Anticipated d/c is to: Home              Patient currently is not medically stable to d/c.   Difficult to place patient No   Consultants:  [facility] Interventional radiology  Procedures:  IR biliary drain placement Small bore feeding tube placement, 7/26  Antimicrobials:  Cipro 7/22 - 7/27 Flagyl 7/24 - 7/27 Cefoxitin 7/21 - 7/21    Subjective: Patient seen examined at bedside, resting comfortably.  Continues with irritation surrounding biliary drain site.  States was able to drink some liquids yesterday, but continues with poor oral intake.  Continues on tube feeds with now mild diarrhea.  Discussed with patient that given his poor oral intake, recommend feeding tube placement which he is in agreement with.  LFTs/bilirubin continue to trend down.  Seen by GI this morning.  No other questions or concerns at this time. Denies headache, no chest pain, no palpitations, no shortness of breath, no cough/congestion, no fever/chills/night sweats, no nausea/vomiting/diarrhea.  No acute concerns overnight per nursing staff.   Objective: Vitals:   08/23/20 1356 08/23/20 1958 08/24/20 0430 08/24/20 0500  BP: (!) 154/60 108/84 (!) 176/70   Pulse: (!) 108 (!) 108 (!) 106   Resp: '17 14 18   '$ Temp: 98.2 F (36.8 C) 98.5 F (36.9 C) 98.7 F (37.1 C)   TempSrc: Oral Oral    SpO2: 100% 100% 100%   Weight:    65.6 kg  Height:        Intake/Output Summary (Last 24 hours) at 08/24/2020 1110 Last data filed at 08/24/2020 0600 Gross per 24 hour  Intake 1033.15 ml  Output 780 ml  Net 253.15 ml   Filed Weights   08/22/20 0500 08/23/20 0500 08/24/20 0500  Weight: 64.3 kg 64.6 kg 65.6 kg    Examination:  General exam: Appears calm and comfortable, chronically ill in appearance, scleral icterus, small bore feeding tube noted with tube feeds infusing Respiratory system: Clear to  auscultation. Respiratory effort normal.  On room air Cardiovascular system: S1 & S2 heard, RRR. No JVD, murmurs, rubs, gallops or clicks. No pedal edema. Gastrointestinal system: Abdomen is nondistended, soft with slight tenderness surrounding biliary drain site. No organomegaly or masses felt. Normal bowel sounds heard.  Biliary drain noted with dark bilious fluid present in bag Central nervous system: Alert and oriented. No focal neurological deficits. Extremities: Symmetric 5 x 5 power. Skin: Jaundice noted, otherwise no rashes, lesions or ulcers Psychiatry: Judgement and insight appear normal. Mood & affect appropriate.     Data Reviewed: I have personally reviewed following labs and imaging studies  CBC: Recent Labs  Lab 08/19/20 0554 08/21/20 0554 08/22/20 0552 08/23/20 0606 08/24/20 0634  WBC 16.3* 13.7* 16.4* 15.9* 16.1*  HGB 11.4* 10.5* 11.8* 11.7* 10.7*  HCT 33.6* 30.7* 35.0* 34.0* 31.4*  MCV 85.5 87.0 87.1 87.0 88.0  PLT 271 236 294 294 [id number]   Basic Metabolic Panel: Recent Labs  Lab 08/20/20 0536 08/21/20 0554 08/21/20 1528 08/21/20 1948 08/22/20 0552 08/22/20 1718 08/23/20 0606 08/24/20 0634  NA 151* 146*  --   --  148*  --  150* 150*  K 3.0* 3.1*  --   --  3.0*  --  3.3* 3.6  CL 115* 110  --   --  110  --  114* 115*  CO2 26 27  --   --  24  --  26 24  GLUCOSE 264* 254*  --   --  341*  --  226* 229*  BUN 23 17  --   --  21  --  19 14  CREATININE 0.87 0.87  --   --  0.83  --  0.73 0.68  CALCIUM 9.1 9.1  --   --  9.9  --  9.7 9.3  MG  --  2.0  --   --  2.4  --   --  2.1  PHOS  --   --  3.0 2.9 2.2* 2.4*  --   --    GFR: Estimated Creatinine Clearance: 84.3 mL/min (by C-G formula based on SCr of 0.68 mg/dL). Liver Function Tests: Recent Labs  Lab 08/20/20 0536 08/21/20 0554 08/22/20 0552 08/23/20 0606 08/24/20 0634  AST 118* 94* 94* 109* 95*  ALT 147* 128* 116* 112* 95*  ALKPHOS 133* 128* 140* 149* 134*  BILITOT 16.3* 15.5* 17.6* 18.7* 16.0*   PROT 6.1* 6.2* 6.9 7.1 6.5  ALBUMIN 2.5* 2.5* 2.7* 2.8* 2.5*   No results for input(s): LIPASE, AMYLASE in the last 168 hours.  No results for input(s): AMMONIA in the last 168 hours. Coagulation Profile: No results for input(s): INR, PROTIME in the last 168 hours.  Cardiac Enzymes: No results for input(s): CKTOTAL, CKMB, CKMBINDEX, TROPONINI in the last 168 hours. BNP (last 3 results) No results for input(s): PROBNP in the last 8760 hours. HbA1C: No results for input(s): HGBA1C in the last 72 hours. CBG: Recent Labs  Lab 08/23/20 1654 08/23/20 2001 08/24/20 0020 08/24/20 0421 08/24/20 0720  GLUCAP 268* 248* 137* 167* 201*   Lipid Profile: No results for input(s): CHOL, HDL, LDLCALC, TRIG, CHOLHDL, LDLDIRECT in the last 72 hours. Thyroid Function Tests: No results for input(s): TSH, T4TOTAL, FREET4, T3FREE, THYROIDAB in the last 72 hours. Anemia Panel: No results for input(s): VITAMINB12, FOLATE, FERRITIN, TIBC, IRON, RETICCTPCT in the last 72 hours. Sepsis Labs: No results for input(s): PROCALCITON, LATICACIDVEN in the last 168 hours.  Recent Results (from the past 240 hour(s))  SARS CORONAVIRUS 2 (TAT 6-24 HRS) Nasopharyngeal Nasopharyngeal Swab     Status: None   Collection Time: 08/14/20  6:04 PM   Specimen: Nasopharyngeal Swab  Result Value Ref Range Status   SARS Coronavirus 2 NEGATIVE NEGATIVE Final    Comment: (NOTE) SARS-CoV-2 target nucleic acids are NOT DETECTED.  The SARS-CoV-2 RNA is generally detectable in upper and lower respiratory specimens during the acute phase of infection. Negative results do not preclude SARS-CoV-2 infection, do not rule out co-infections with other pathogens, and should not be used as the sole basis for treatment or other patient management decisions. Negative results must be combined with clinical observations, patient history, and epidemiological information. The expected result is Negative.  Fact Sheet for  Patients: SugarRoll.be  Fact Sheet for Healthcare Providers: https://www.woods-mathews.com/  This test is not yet approved or cleared by the [place] FDA and  has been authorized for detection and/or diagnosis of SARS-CoV-2 by FDA under an Emergency Use Authorization (EUA). This EUA will remain  in effect (meaning this test can be used) for the duration of the COVID-19 declaration under Se ction 564(b)(1) of the Act, 21 U.S.C. section 360bbb-3(b)(1), unless the authorization is terminated or revoked sooner.  Performed at [facility], [place] [address] [place], [place] [address]   Body fluid culture w Gram Stain     Status: None (Preliminary result)   Collection Time: 08/22/20 11:12 AM   Specimen: BILE; Body Fluid  Result Value Ref Range Status   Specimen  Description BILE  Final   Special Requests Normal  Final   Gram Stain   Final    NO WBC SEEN MODERATE YEAST FEW GRAM POSITIVE COCCI    Culture   Final    MODERATE STAPHYLOCOCCUS EPIDERMIDIS CULTURE REINCUBATED FOR BETTER GROWTH    Report Status PENDING  Incomplete   Organism ID, Bacteria STAPHYLOCOCCUS EPIDERMIDIS  Final      Susceptibility   Staphylococcus epidermidis - MIC*    CIPROFLOXACIN >=8 RESISTANT Resistant     ERYTHROMYCIN >=8 RESISTANT Resistant     GENTAMICIN <=0.5 SENSITIVE Sensitive     OXACILLIN >=4 RESISTANT Resistant     TETRACYCLINE >=16 RESISTANT Resistant     VANCOMYCIN 1 SENSITIVE Sensitive     TRIMETH/SULFA <=10 SENSITIVE Sensitive     CLINDAMYCIN >=8 RESISTANT Resistant     RIFAMPIN <=0.5 SENSITIVE Sensitive     Inducible Clindamycin Value in next row Sensitive      NEGATIVEPerformed at [facility] [address], [place], [place] [address]    * MODERATE STAPHYLOCOCCUS EPIDERMIDIS         Radiology Studies: DG Abd 1 View  Result Date: 08/22/2020 CLINICAL DATA:  Confirm NG tube placement EXAM: ABDOMEN - 1 VIEW COMPARISON:  None.  FINDINGS: There is a percutaneous biliary stent in place. There is a weighted tip enteric tube coiled with tip overlying the stomach the lung bases are clear. Nonobstructive upper abdominal bowel gas pattern. IMPRESSION: Weighted tip enteric tube is coiled in the left upper quadrant overlying the stomach. Electronically Signed   By: Maurine Simmering   On: 08/22/2020 20:16   DG Abd 1 View  Result Date: 08/22/2020 CLINICAL DATA:  Pt has biliary obstruction from pancreatic cancer, nausea, vomiting. EXAM: ABDOMEN - 1 VIEW COMPARISON:  08/21/2020 FINDINGS: Biliary tube in unchanged position. The bowel gas pattern is normal. No radio-opaque calculi or other significant radiographic abnormality are seen. IMPRESSION: Biliary tube in unchanged position. Electronically Signed   By: Kathreen Devoid   On: 08/22/2020 14:49   IR CHOLANGIOGRAM EXISTING TUBE  Result Date: 08/23/2020 INDICATION: 65 year old gentleman with history of metastatic pancreatic cancer status post external biliary drain placement on 08/16/2020 followed by conversion to internal external biliary drain on 08/17/2020 returns to interventional radiology given rising bilirubin. EXAM: Fluoroscopic biliary drain check MEDICATIONS: None ANESTHESIA/SEDATION: None FLUOROSCOPY TIME:  Fluoroscopy Time: 0 minutes 30 seconds (4 mGy). COMPLICATIONS: None immediate. PROCEDURE: Informed written consent was obtained from the patient after a thorough discussion of the procedural risks, benefits and alternatives. Patient positioned supine on the procedure table. Scout image demonstrated the internal external biliary drain in appropriate unchanged position. Contrast administered through the drain confirmed patency. Drain flushed and reattached to bag. IMPRESSION: Fluoroscopically cholangiogram confirmed appropriate positioning and function of internal external biliary drain. Electronically Signed   By: Miachel Roux M.D.   On: 08/23/2020 16:21        Scheduled Meds:   amLODipine  5 mg Oral Daily   feeding supplement  1 Container Oral TID BM   feeding supplement (PROSource TF)  45 mL Per Tube TID   ferrous sulfate  325 mg Oral Q breakfast   free water  200 mL Per Tube Q4H   insulin aspart  0-15 Units Subcutaneous TID WC   insulin aspart  0-5 Units Subcutaneous QHS   insulin aspart  2 Units Subcutaneous Q4H   insulin detemir  40 Units Subcutaneous Daily   nystatin  5 mL Oral QID  pantoprazole  40 mg Intravenous Q1400   rosuvastatin  5 mg Oral Weekly   sodium chloride flush  5 mL Intracatheter Q8H   sodium chloride flush  5 mL Intracatheter Q8H   Continuous Infusions:  feeding supplement (OSMOLITE 1.5 CAL) 35 mL/hr at 08/22/20 2345      LOS: 9 days    Time spent: 39 minutes spent on chart review, discussion with nursing staff, consultants, updating family and interview/physical exam; more than 50% of that time was spent in counseling and/or coordination of care.    Caria Transue J [place] DO [facility] Available via Epic secure chat 7am-7pm After these hours, please refer to coverage provider listed on [email or web address] 08/24/2020, 11:10 AM

## 2020-08-24 NOTE — Progress Notes (Signed)
Occupational Therapy Treatment Patient Details Name: Jeff Jordan MRN: [id number] DOB: Jun 25, 1955 Today's Date: 08/24/2020    History of present illness Pt admitted with bilary obstruction 2*pancreatic adenocarcinoma and now s/p bilary drain placement.  Pt with hx of DM.   OT comments  Patient progressing well with OT goals, overall supervision level for functional ambulation and transfers with rolling walker. Able to complete oral care and UB/LB bathing standing at sink side without loss of balance. Does need min cues for safety during transfers for hand placement not to pull on walker. OT to follow acutely.    Follow Up Recommendations  Home health OT    Equipment Recommendations  None recommended by OT       Precautions / Restrictions Precautions Precautions: Fall;Other (comment) Precaution Comments: bilary drain on R, NG       Mobility Bed Mobility Overal bed mobility: Needs Assistance Bed Mobility: Supine to Sit     Supine to sit: Supervision     General bed mobility comments: S for safety/ drain management    Transfers Overall transfer level: Needs assistance Equipment used: Rolling walker (2 wheeled) Transfers: Sit to/from Stand Sit to Stand: Supervision         General transfer comment: needed cues to push from bed vs pulling on walker    Balance Overall balance assessment: Needs assistance Sitting-balance support: Feet supported Sitting balance-Leahy Scale: Good     Standing balance support: No upper extremity supported Standing balance-Leahy Scale: Fair Standing balance comment: standing at sink                           ADL either performed or assessed with clinical judgement   ADL Overall ADL's : Needs assistance/impaired     Grooming: Oral care;Wash/dry face;Wash/dry hands;Supervision/safety;Standing Grooming Details (indicate cue type and reason): able to stand without upper extremity support on sink Upper Body Bathing:  Supervision/ safety;Standing Upper Body Bathing Details (indicate cue type and reason): to wash arms/under arms, no loss of balance noted Lower Body Bathing: Supervison/ safety;Sit to/from stand Lower Body Bathing Details (indicate cue type and reason): patient able to wash perianal area in standing without assistance         Toilet Transfer: Supervision/safety;RW Toilet Transfer Details (indicate cue type and reason): no physical assistance to power up to standing, did need verbal cue for hand placement not to pull on walker         Functional mobility during ADLs: Supervision/safety;Rolling walker        Cognition Arousal/Alertness: Awake/alert Behavior During Therapy: WFL for tasks assessed/performed;Flat affect Overall Cognitive Status: Within Functional Limits for tasks assessed                                                     Pertinent Vitals/ Pain       Pain Assessment: Faces Faces Pain Scale: Hurts little more Pain Location: abdomen Pain Descriptors / Indicators: Sore Pain Intervention(s): Monitored during session         Frequency  Min 2X/week        Progress Toward Goals  OT Goals(current goals can now be found in the care plan section)  Progress towards OT goals: Progressing toward goals  Acute Rehab OT Goals Patient Stated Goal: Regain IND OT Goal Formulation: With patient Time  For Goal Achievement: 09/03/20 Potential to Achieve Goals: Good ADL Goals Pt Will Transfer to Toilet: with modified independence;ambulating Pt Will Perform Toileting - Clothing Manipulation and hygiene: with modified independence;sit to/from stand Additional ADL Goal #1: Patient will stand at sink to perform grooming task as evidence of improving activity tolerance  Plan Discharge plan remains appropriate       AM-PAC OT "6 Clicks" Daily Activity     Outcome Measure   Help from another person eating meals?: None Help from another person taking  care of personal grooming?: A Little Help from another person toileting, which includes using toliet, bedpan, or urinal?: A Little Help from another person bathing (including washing, rinsing, drying)?: A Little Help from another person to put on and taking off regular upper body clothing?: A Little Help from another person to put on and taking off regular lower body clothing?: A Little 6 Click Score: 19    End of Session Equipment Utilized During Treatment: Rolling walker  OT Visit Diagnosis: Muscle weakness (generalized) (M62.81);Unsteadiness on feet (R26.81)   Activity Tolerance Patient tolerated treatment well   Patient Left in chair;with call bell/phone within reach;with chair alarm set   Nurse Communication Mobility status        Time: 1034-1050 OT Time Calculation (min): 16 min  Charges: OT General Charges $OT Visit: 1 Visit OT Treatments $Self Care/Home Management : 8-22 mins  Delbert Phenix OT OT pager: [place] 08/24/2020, 1:00 PM

## 2020-08-25 DIAGNOSIS — K831 Obstruction of bile duct: Secondary | ICD-10-CM | POA: Diagnosis not present

## 2020-08-25 DIAGNOSIS — E440 Moderate protein-calorie malnutrition: Secondary | ICD-10-CM | POA: Diagnosis not present

## 2020-08-25 DIAGNOSIS — R112 Nausea with vomiting, unspecified: Secondary | ICD-10-CM | POA: Diagnosis not present

## 2020-08-25 DIAGNOSIS — D72825 Bandemia: Secondary | ICD-10-CM

## 2020-08-25 DIAGNOSIS — R531 Weakness: Secondary | ICD-10-CM

## 2020-08-25 DIAGNOSIS — C259 Malignant neoplasm of pancreas, unspecified: Secondary | ICD-10-CM | POA: Diagnosis not present

## 2020-08-25 LAB — CBC
HCT: 30 % — ABNORMAL LOW (ref 39.0–52.0)
Hemoglobin: 10.1 g/dL — ABNORMAL LOW (ref 13.0–17.0)
MCH: 30.4 pg (ref 26.0–34.0)
MCHC: 33.7 g/dL (ref 30.0–36.0)
MCV: 90.4 fL (ref 80.0–100.0)
Platelets: 235 10*3/uL (ref 150–400)
RBC: 3.32 MIL/uL — ABNORMAL LOW (ref 4.22–5.81)
RDW: 24.9 % — ABNORMAL HIGH (ref 11.5–15.5)
WBC: 15.3 10*3/uL — ABNORMAL HIGH (ref 4.0–10.5)
nRBC: 0 % (ref 0.0–0.2)

## 2020-08-25 LAB — COMPREHENSIVE METABOLIC PANEL
ALT: 89 U/L — ABNORMAL HIGH (ref 0–44)
AST: 95 U/L — ABNORMAL HIGH (ref 15–41)
Albumin: 2.4 g/dL — ABNORMAL LOW (ref 3.5–5.0)
Alkaline Phosphatase: 134 U/L — ABNORMAL HIGH (ref 38–126)
Anion gap: 8 (ref 5–15)
BUN: 15 mg/dL (ref 8–23)
CO2: 24 mmol/L (ref 22–32)
Calcium: 9.1 mg/dL (ref 8.9–10.3)
Chloride: 116 mmol/L — ABNORMAL HIGH (ref 98–111)
Creatinine, Ser: 0.63 mg/dL (ref 0.61–1.24)
GFR, Estimated: >60 mL/min (ref >60)
Glucose, Bld: 236 mg/dL — ABNORMAL HIGH (ref 70–99)
Potassium: 3.5 mmol/L (ref 3.5–5.1)
Sodium: 148 mmol/L — ABNORMAL HIGH (ref 135–145)
Total Bilirubin: 13.5 mg/dL — ABNORMAL HIGH (ref 0.3–1.2)
Total Protein: 6.3 g/dL — ABNORMAL LOW (ref 6.5–8.1)

## 2020-08-25 LAB — GLUCOSE, CAPILLARY
Glucose-Capillary: 198 mg/dL — ABNORMAL HIGH (ref 70–99)
Glucose-Capillary: 217 mg/dL — ABNORMAL HIGH (ref 70–99)
Glucose-Capillary: 186 mg/dL — ABNORMAL HIGH (ref 70–99)
Glucose-Capillary: 194 mg/dL — ABNORMAL HIGH (ref 70–99)
Glucose-Capillary: 217 mg/dL — ABNORMAL HIGH (ref 70–99)

## 2020-08-25 LAB — MAGNESIUM: Magnesium: 2.1 mg/dL (ref 1.7–2.4)

## 2020-08-25 MED ORDER — DOXYLAMINE SUCCINATE (SLEEP) 25 MG PO TABS
25.0000 mg | ORAL_TABLET | Freq: Every evening | ORAL | Status: DC | PRN
  Administered 2020-08-25 – 2020-08-30 (×3): 25 mg via ORAL
  Filled 2020-08-25 (×5): qty 1

## 2020-08-25 MED ORDER — INSULIN ASPART 100 UNIT/ML IJ SOLN
3.0000 [IU] | INTRAMUSCULAR | Status: DC
  Administered 2020-08-25 – 2020-08-29 (×9): 3 [IU] via SUBCUTANEOUS

## 2020-08-25 MED ORDER — MELATONIN 3 MG PO TABS: 3.0000 mg | ORAL_TABLET | Freq: Every day | ORAL | Status: DC

## 2020-08-25 MED ORDER — INSULIN DETEMIR 100 UNIT/ML ~~LOC~~ SOLN
30.0000 [IU] | Freq: Two times a day (BID) | SUBCUTANEOUS | Status: DC
  Administered 2020-08-25 – 2020-08-26 (×2): 30 [IU] via SUBCUTANEOUS
  Filled 2020-08-25 (×6): qty 0.3

## 2020-08-25 NOTE — Progress Notes (Signed)
PROGRESS NOTE  Jeff Jordan [id number] DOB: 07/03/1955   PCP: Georganna Skeans, PA-C  Patient is from: Home.  DOA: 08/14/2020 LOS: 10  Chief complaints:  No chief complaint on file.    Brief Narrative / Interim history: 65 year old M with PMH of pancreatic adenocarcinoma, DM-2, HTN and FTT presenting for biliary obstruction due to pancreatic adenocarcinoma after he failed biliary stent placement by ERCP at GI office on 7/19.  He had percutaneous drain placed on 7/21 that was converted to internal and external biliary drain on 7/22.  He underwent cholangiogram on 7/28.  Biliary fluid with Candida glabrata and staph epidermis.  Started on Diflucan and ceftriaxone on 7/29.  LFT and bilirubin improving.  IR to place G-tube for feeding.  Biliary fluid  Subjective: Seen and examined earlier this morning.  No major events overnight of this morning.  He reports pain over RUQ area and adjacent areas of his chest.  He denies trouble breathing, nausea, vomiting or diarrhea.  Objective: Vitals:   08/24/20 1401 08/24/20 2041 08/25/20 0413 08/25/20 1439  BP: (!) 156/63 (!) 153/69 136/64 (!) 142/57  Pulse: 100 (!) 108 (!) 108 98  Resp: '16 20 20 17  '$ Temp: (!) 97.4 F (36.3 C) 97.8 F (36.6 C) 98.5 F (36.9 C) 98.1 F (36.7 C)  TempSrc: Oral Oral Oral Oral  SpO2: 100% 98% 100% 100%  Weight:   63.3 kg   Height:        Intake/Output Summary (Last 24 hours) at 08/25/2020 1557 Last data filed at 08/25/2020 1112 Gross per 24 hour  Intake 1226.67 ml  Output 1050 ml  Net 176.67 ml   Filed Weights   08/23/20 0500 08/24/20 0500 08/25/20 0413  Weight: 64.6 kg 65.6 kg 63.3 kg    Examination:  GENERAL: Frail and chronically ill-appearing. HEENT: MMM.  Sclerae icteric.  Cortrack in place NECK: Supple.  No apparent JVD.  RESP: On RA.  No IWOB.  Fair aeration bilaterally. CVS:  RRR. Heart sounds normal.  ABD/GI/GU: BS+. Abd soft, NTND.  Percutaneous drain from RUQ MSK/EXT:  Moves extremities.   Significant muscle mass and subcu fat loss. SKIN: no apparent skin lesion or wound NEURO: Awake, alert and oriented appropriately.  No apparent focal neuro deficit. PSYCH: Calm. Normal affect.   Procedures:  -Outpatient biliary stent placement on 7/19 unsuccessful -Percutaneous drain placement on 7/21 -Conversion of external biliary drain to internal and external biliary drain on 7/22 -IR cholangiogram on 7/28  Microbiology summarized: COVID-19 PCR nonreactive. Biliary culture with staph epidermis and Candida glabrata  Assessment & Plan: Biliary obstruction due to pancreatic adenocarcinoma -Outpatient biliary stent placement on 7/19 unsuccessful -Percutaneous drain placement on 7/21 -Conversion of external biliary drain to internal and external biliary drain on 7/22 -IR cholangiogram on 7/28 -IR and GI following. -LFT and bilirubin improving. -Has referral to oncology office in [place] -Pain control  Biliary infection in the setting of the above: Biliary fluid culture with Candida glabrata and staph epidermis. -Started on IV ceftriaxone and IV Diflucan on 7/29 -We will have ID for further recommendation on his antibiotics  Hypernatremia likely hypovolemic.  Improving. -Free water increased to 200 cc every 4 hours on 7/29 -Recheck in the morning  NIDDM-2 with hyperglycemia: On Jardiance, metformin and Actos at home. Recent Labs  Lab 08/24/20 2035 08/24/20 2354 08/25/20 0406 08/25/20 0756 08/25/20 1151  GLUCAP 144* 135* 198* 217* 186*  -Change Levemir from 40 units daily to 30 units twice daily -Continue SSI-moderate -Increase Novolog  from 2-3 q4h while on tube feeds -Check hemoglobin A1c -Continue home Crestor  Essential hypertension: Normotensive. -Continue amlodipine 5 mg p.o. daily -Continue hydralazine IV as needed   HLD: Crestor 5 mg p.o. weekly   GERD: Protonix 40 mg IV daily   Thrush: Nystatin swish and swallow.  He is also on Diflucan.   Diarrhea:  Likely due to #1, and Carafate. -Prevalite twice daily  Generalized weakness/debility -PT/OT eval  Leukocytosis/bandemia: Likely due to biliary infection. -Continue monitoring  Adult failure to thrive/moderate malnutrition-poor p.o. intake in the setting of adenocarcinoma. -Plan for G-tube on 8/1 Body mass index is 20.02 kg/m. Nutrition Problem: Moderate Malnutrition Etiology: chronic illness, cancer and cancer related treatments Signs/Symptoms: mild fat depletion, mild muscle depletion, moderate muscle depletion Interventions: Tube feeding   DVT prophylaxis:  SCDs Start: 08/14/20 1624  Code Status: Full code Family Communication: Patient and/or RN. Available if any question.  Level of care: Med-Surg Status is: Inpatient  Remains inpatient appropriate because:Ongoing diagnostic testing needed not appropriate for outpatient work up, IV treatments appropriate due to intensity of illness or inability to take PO, and Inpatient level of care appropriate due to severity of illness  Dispo: The patient is from: Home              Anticipated d/c is to: Home              Patient currently is not medically stable to d/c.   Difficult to place patient No       Consultants:  GI IR   Sch Meds:  Scheduled Meds:  amLODipine  5 mg Oral Daily   cholestyramine light  4 g Oral BID   feeding supplement  1 Container Oral TID BM   feeding supplement (PROSource TF)  45 mL Per Tube TID   ferrous sulfate  325 mg Oral Q breakfast   free water  200 mL Per Tube Q4H   insulin aspart  0-15 Units Subcutaneous TID WC   insulin aspart  0-5 Units Subcutaneous QHS   insulin aspart  2 Units Subcutaneous Q4H   insulin detemir  40 Units Subcutaneous Daily   nystatin  5 mL Oral QID   pantoprazole  40 mg Intravenous Q1400   rosuvastatin  5 mg Oral Weekly   sodium chloride flush  5 mL Intracatheter Q8H   sodium chloride flush  5 mL Intracatheter Q8H   Continuous Infusions:  cefTRIAXone (ROCEPHIN)   IV 2 g (08/24/20 1806)   feeding supplement (OSMOLITE 1.5 CAL) 55 mL/hr at 08/24/20 1635   fluconazole (DIFLUCAN) IV 100 mg (08/24/20 1839)   [START ON 08/27/2020] vancomycin     PRN Meds:.hydrALAZINE, morphine injection, ondansetron (ZOFRAN) IV, oxyCODONE  Antimicrobials: Anti-infectives (From admission, onward)    Start     Dose/Rate Route Frequency Ordered Stop   08/27/20 1500  vancomycin (VANCOCIN) IVPB 1000 mg/200 mL premix        1,000 mg 200 mL/hr over 60 Minutes Intravenous To Radiology 08/24/20 1644 08/28/20 1500   08/24/20 1800  cefTRIAXone (ROCEPHIN) 2 g in sodium chloride 0.9 % 100 mL IVPB        2 g 200 mL/hr over 30 Minutes Intravenous Every 24 hours 08/24/20 1640 08/31/20 1759   08/24/20 1800  fluconazole (DIFLUCAN) IVPB 100 mg        100 mg 50 mL/hr over 60 Minutes Intravenous Every 24 hours 08/24/20 1640 08/31/20 1759   08/21/20 1100  metroNIDAZOLE (FLAGYL) tablet 500 mg  Status:  Discontinued        500 mg Oral Every 8 hours 08/21/20 1002 08/22/20 0744   08/19/20 1800  metroNIDAZOLE (FLAGYL) IVPB 500 mg  Status:  Discontinued        500 mg 100 mL/hr over 60 Minutes Intravenous Every 8 hours 08/19/20 1614 08/21/20 1002   08/17/20 0800  ciprofloxacin (CIPRO) tablet 500 mg        500 mg Oral 2 times daily 08/17/20 0411 08/21/20 2152   08/16/20 0600  cefOXitin (MEFOXIN) 2 g in sodium chloride 0.9 % 100 mL IVPB        2 g 200 mL/hr over 30 Minutes Intravenous To Radiology 08/14/20 1620 08/16/20 1704        I have personally reviewed the following labs and images: CBC: Recent Labs  Lab 08/21/20 0554 08/22/20 0552 08/23/20 0606 08/24/20 0634 08/25/20 0718  WBC 13.7* 16.4* 15.9* 16.1* 15.3*  HGB 10.5* 11.8* 11.7* 10.7* 10.1*  HCT 30.7* 35.0* 34.0* 31.4* 30.0*  MCV 87.0 87.1 87.0 88.0 90.4  PLT 236 294 294 259 235   BMP &GFR Recent Labs  Lab 08/21/20 0554 08/21/20 1528 08/21/20 1948 08/22/20 0552 08/22/20 1718 08/23/20 0606 08/24/20 0634  08/25/20 0718  NA 146*  --   --  148*  --  150* 150* 148*  K 3.1*  --   --  3.0*  --  3.3* 3.6 3.5  CL 110  --   --  110  --  114* 115* 116*  CO2 27  --   --  24  --  '26 24 24  '$ GLUCOSE 254*  --   --  341*  --  226* 229* 236*  BUN 17  --   --  21  --  '19 14 15  '$ CREATININE 0.87  --   --  0.83  --  0.73 0.68 0.63  CALCIUM 9.1  --   --  9.9  --  9.7 9.3 9.1  MG 2.0  --   --  2.4  --   --  2.1 2.1  PHOS  --  3.0 2.9 2.2* 2.4*  --   --   --    Estimated Creatinine Clearance: 81.3 mL/min (by C-G formula based on SCr of 0.63 mg/dL). Liver & Pancreas: Recent Labs  Lab 08/21/20 0554 08/22/20 0552 08/23/20 0606 08/24/20 0634 08/25/20 0718  AST 94* 94* 109* 95* 95*  ALT 128* 116* 112* 95* 89*  ALKPHOS 128* 140* 149* 134* 134*  BILITOT 15.5* 17.6* 18.7* 16.0* 13.5*  PROT 6.2* 6.9 7.1 6.5 6.3*  ALBUMIN 2.5* 2.7* 2.8* 2.5* 2.4*   No results for input(s): LIPASE, AMYLASE in the last 168 hours. No results for input(s): AMMONIA in the last 168 hours. Diabetic: No results for input(s): HGBA1C in the last 72 hours. Recent Labs  Lab 08/24/20 2035 08/24/20 2354 08/25/20 0406 08/25/20 0756 08/25/20 1151  GLUCAP 144* 135* 198* 217* 186*   Cardiac Enzymes: No results for input(s): CKTOTAL, CKMB, CKMBINDEX, TROPONINI in the last 168 hours. No results for input(s): PROBNP in the last 8760 hours. Coagulation Profile: No results for input(s): INR, PROTIME in the last 168 hours. Thyroid Function Tests: No results for input(s): TSH, T4TOTAL, FREET4, T3FREE, THYROIDAB in the last 72 hours. Lipid Profile: No results for input(s): CHOL, HDL, LDLCALC, TRIG, CHOLHDL, LDLDIRECT in the last 72 hours. Anemia Panel: No results for input(s): VITAMINB12, FOLATE, FERRITIN, TIBC, IRON, RETICCTPCT in the last 72 hours. Urine analysis: No results found  for: COLORURINE, APPEARANCEUR, LABSPEC, PHURINE, GLUCOSEU, HGBUR, BILIRUBINUR, KETONESUR, PROTEINUR, UROBILINOGEN, NITRITE, LEUKOCYTESUR Sepsis  Labs: Invalid input(s): PROCALCITONIN, [place]  Microbiology: Recent Results (from the past 240 hour(s))  Body fluid culture w Gram Stain     Status: None   Collection Time: 08/22/20 11:12 AM   Specimen: BILE; Body Fluid  Result Value Ref Range Status   Specimen Description   Final    BILE Performed at [facility], [place] [address] [place], [place] [address]    Special Requests   Final    Normal Performed at [facility], [place] [address], [place], [place] [address]    Gram Stain   Final    NO WBC SEEN MODERATE YEAST FEW GRAM POSITIVE COCCI Performed at [facility], [place] [address] [place], [place] [address]    Culture   Final    MODERATE STAPHYLOCOCCUS EPIDERMIDIS MODERATE CANDIDA GLABRATA    Report Status 08/24/2020 FINAL  Final   Organism ID, Bacteria STAPHYLOCOCCUS EPIDERMIDIS  Final      Susceptibility   Staphylococcus epidermidis - MIC*    CIPROFLOXACIN >=8 RESISTANT Resistant     ERYTHROMYCIN >=8 RESISTANT Resistant     GENTAMICIN <=0.5 SENSITIVE Sensitive     OXACILLIN >=4 RESISTANT Resistant     TETRACYCLINE >=16 RESISTANT Resistant     VANCOMYCIN 1 SENSITIVE Sensitive     TRIMETH/SULFA <=10 SENSITIVE Sensitive     CLINDAMYCIN >=8 RESISTANT Resistant     RIFAMPIN <=0.5 SENSITIVE Sensitive     Inducible Clindamycin NEGATIVE Sensitive     * MODERATE STAPHYLOCOCCUS EPIDERMIDIS    Radiology Studies: No results found.    Jonanthony Nahar T. [place]  If 7PM-7AM, please contact night-coverage [email or web address] 08/25/2020, 3:57 PM

## 2020-08-26 DIAGNOSIS — R112 Nausea with vomiting, unspecified: Secondary | ICD-10-CM | POA: Diagnosis not present

## 2020-08-26 DIAGNOSIS — C259 Malignant neoplasm of pancreas, unspecified: Secondary | ICD-10-CM | POA: Diagnosis not present

## 2020-08-26 DIAGNOSIS — K8309 Other cholangitis: Secondary | ICD-10-CM | POA: Diagnosis not present

## 2020-08-26 DIAGNOSIS — K831 Obstruction of bile duct: Secondary | ICD-10-CM | POA: Diagnosis not present

## 2020-08-26 DIAGNOSIS — E876 Hypokalemia: Secondary | ICD-10-CM

## 2020-08-26 DIAGNOSIS — E440 Moderate protein-calorie malnutrition: Secondary | ICD-10-CM | POA: Diagnosis not present

## 2020-08-26 LAB — COMPREHENSIVE METABOLIC PANEL
ALT: 91 U/L — ABNORMAL HIGH (ref 0–44)
AST: 108 U/L — ABNORMAL HIGH (ref 15–41)
Albumin: 2.4 g/dL — ABNORMAL LOW (ref 3.5–5.0)
Alkaline Phosphatase: 136 U/L — ABNORMAL HIGH (ref 38–126)
Anion gap: 8 (ref 5–15)
BUN: 15 mg/dL (ref 8–23)
CO2: 23 mmol/L (ref 22–32)
Calcium: 9 mg/dL (ref 8.9–10.3)
Chloride: 116 mmol/L — ABNORMAL HIGH (ref 98–111)
Creatinine, Ser: 0.58 mg/dL — ABNORMAL LOW (ref 0.61–1.24)
GFR, Estimated: >60 mL/min (ref >60)
Glucose, Bld: 103 mg/dL — ABNORMAL HIGH (ref 70–99)
Potassium: 3.1 mmol/L — ABNORMAL LOW (ref 3.5–5.1)
Sodium: 147 mmol/L — ABNORMAL HIGH (ref 135–145)
Total Bilirubin: 11.5 mg/dL — ABNORMAL HIGH (ref 0.3–1.2)
Total Protein: 6.4 g/dL — ABNORMAL LOW (ref 6.5–8.1)

## 2020-08-26 LAB — CBC
HCT: 30.1 % — ABNORMAL LOW (ref 39.0–52.0)
Hemoglobin: 10.2 g/dL — ABNORMAL LOW (ref 13.0–17.0)
MCH: 30.4 pg (ref 26.0–34.0)
MCHC: 33.9 g/dL (ref 30.0–36.0)
MCV: 89.9 fL (ref 80.0–100.0)
Platelets: 256 10*3/uL (ref 150–400)
RBC: 3.35 MIL/uL — ABNORMAL LOW (ref 4.22–5.81)
RDW: 25.2 % — ABNORMAL HIGH (ref 11.5–15.5)
WBC: 12.5 10*3/uL — ABNORMAL HIGH (ref 4.0–10.5)
nRBC: 0.2 % (ref 0.0–0.2)

## 2020-08-26 LAB — PHOSPHORUS: Phosphorus: 2.8 mg/dL (ref 2.5–4.6)

## 2020-08-26 LAB — GLUCOSE, CAPILLARY
Glucose-Capillary: 126 mg/dL — ABNORMAL HIGH (ref 70–99)
Glucose-Capillary: 134 mg/dL — ABNORMAL HIGH (ref 70–99)
Glucose-Capillary: 146 mg/dL — ABNORMAL HIGH (ref 70–99)
Glucose-Capillary: 159 mg/dL — ABNORMAL HIGH (ref 70–99)
Glucose-Capillary: 219 mg/dL — ABNORMAL HIGH (ref 70–99)
Glucose-Capillary: 89 mg/dL (ref 70–99)

## 2020-08-26 LAB — MAGNESIUM: Magnesium: 2.2 mg/dL (ref 1.7–2.4)

## 2020-08-26 MED ORDER — SODIUM CHLORIDE 0.9 % IV SOLN
200.0000 mg | Freq: Once | INTRAVENOUS | Status: AC
  Administered 2020-08-26: 200 mg via INTRAVENOUS
  Filled 2020-08-26: qty 200

## 2020-08-26 MED ORDER — DEXTROSE-NACL 5-0.9 % IV SOLN: INTRAVENOUS | Status: DC

## 2020-08-26 MED ORDER — FREE WATER
250.0000 mL | Status: DC
  Administered 2020-08-26 – 2020-08-30 (×18): 250 mL

## 2020-08-26 MED ORDER — POTASSIUM CHLORIDE 20 MEQ PO PACK
40.0000 meq | PACK | ORAL | Status: AC
  Administered 2020-08-26 (×2): 40 meq

## 2020-08-26 MED ORDER — SODIUM CHLORIDE 0.9 % IV SOLN
100.0000 mg | Freq: Every day | INTRAVENOUS | Status: AC
  Administered 2020-08-27 – 2020-08-30 (×4): 100 mg via INTRAVENOUS
  Filled 2020-08-26 (×4): qty 100

## 2020-08-26 MED ORDER — SODIUM CHLORIDE 0.9 % IV SOLN
3.0000 g | Freq: Four times a day (QID) | INTRAVENOUS | Status: DC
  Administered 2020-08-26 – 2020-08-28 (×7): 3 g via INTRAVENOUS
  Filled 2020-08-26 (×5): qty 3
  Filled 2020-08-26 (×2): qty 8
  Filled 2020-08-26 (×2): qty 3

## 2020-08-26 NOTE — Progress Notes (Signed)
PROGRESS NOTE  Jeff Jordan [id number] DOB: 1955/10/23   PCP: Georganna Skeans, PA-C  Patient is from: Home.  DOA: 08/14/2020 LOS: 11  Chief complaints:  No chief complaint on file.    Brief Narrative / Interim history: 65 year old M with PMH of pancreatic adenocarcinoma, DM-2, HTN and FTT presenting for biliary obstruction due to pancreatic adenocarcinoma after he failed biliary stent placement by ERCP at GI office on 7/19.  He had percutaneous drain placed on 7/21 that was converted to internal and external biliary drain on 7/22.  He underwent cholangiogram on 7/28.  Biliary fluid with Candida glabrata and staph epidermis.  Started on Diflucan and ceftriaxone on 7/29 which were since changed to Eraxis and IV Unasyn per ID recommendation.  LFT and bilirubin improving.  IR to place G-tube for feeding.   Subjective: Seen and examined earlier this morning.  No major events overnight of this morning.  No complaints.  Pain fairly controlled.  He denies nausea or vomiting.  Denies chest pain or dyspnea.  Objective: Vitals:   08/25/20 1439 08/25/20 2100 08/26/20 0600 08/26/20 1033  BP: (!) 142/57 (!) 147/61 (!) 166/68 137/64  Pulse: 98 (!) 102 (!) 103   Resp: '17 16 16   '$ Temp: 98.1 F (36.7 C) 99 F (37.2 C) 98 F (36.7 C)   TempSrc: Oral Oral Oral   SpO2: 100% 100% 100%   Weight:      Height:        Intake/Output Summary (Last 24 hours) at 08/26/2020 1216 Last data filed at 08/26/2020 1120 Gross per 24 hour  Intake 625 ml  Output 1075 ml  Net -450 ml   Filed Weights   08/23/20 0500 08/24/20 0500 08/25/20 0413  Weight: 64.6 kg 65.6 kg 63.3 kg    Examination:  GENERAL: Chronically ill-appearing. HEENT: MMM.  Sclerae icteric. Cortrack in place NECK: Supple.  No apparent JVD.  RESP: On RA.  No IWOB.  Fair aeration bilaterally. CVS:  RRR. Heart sounds normal.  ABD/GI/GU: BS+. Abd soft, NTND.  Percutaneous drain from RUQ. MSK/EXT:  Moves extremities.  Significant muscle  mass and subcu fat loss. SKIN: no apparent skin lesion or wound NEURO: Awake and alert. Oriented appropriately.  No apparent focal neuro deficit. PSYCH: Calm. Normal affect.   Procedures:  -Outpatient biliary stent placement on 7/19 unsuccessful -Percutaneous drain placement on 7/21 -Conversion of external biliary drain to internal and external biliary drain on 7/22 -IR cholangiogram on 7/28  Microbiology summarized: COVID-19 PCR nonreactive. Biliary culture with staph epidermis and Candida glabrata  Assessment & Plan: Biliary obstruction due to pancreatic adenocarcinoma Elevated liver enzymes/obstructive jaundice-total bili improving.  LFT relatively stable. -Outpatient biliary stent placement on 7/19 unsuccessful -Percutaneous drain placement on 7/21 -Conversion of external biliary drain to internal and external biliary drain on 7/22 -IR cholangiogram on 7/28 -IR and GI following. -Has referral to oncology office in [place] -Pain control -Hold home Crestor.  Biliary infection in the setting of the above: Biliary fluid culture with Candida glabrata and staph epidermis.  Staph epidermis likely contaminant but ID recommended GNR and anaerobic coverage, and changing Diflucan to Eraxis. -IV ceftriaxone and IV Diflucan on 7/29>> Eraxis and IV Unasyn 7/31>> -Formal ID recommendation pending.  Hypernatremia likely hypovolemic.  Improving slowly. -Increase free water to 250 cc every 4 hours -Recheck in the morning  NIDDM-2 with hyperglycemia: On Jardiance, metformin and Actos at home. Recent Labs  Lab 08/25/20 2025 08/26/20 0009 08/26/20 0354 08/26/20 0755 08/26/20 1145  GLUCAP  217* 159* 126* 89 134*  -Changed Levemir from 40 units daily to 30 units twice daily -Continue SSI-moderate -Continue NovoLog 3 units q4h while on tube feeds -Follow hemoglobin A1c -Hold home Crestor with elevated LFT  Essential hypertension: Normotensive. -Continue amlodipine 5 mg p.o.  daily -Continue hydralazine IV as needed  Hypokalemia: K3.1. -K-Dur 40 mill equivalent x2   HLD: Hold Crestor in the setting of elevated LFT.   GERD: Protonix 40 mg IV daily   Thrush: Nystatin swish and swallow.  He is also on Eraxis now.   Diarrhea: Likely due to #1, and Carafate.  Resolved.  Generalized weakness/debility -PT/OT eval  Leukocytosis/bandemia: Likely due to biliary infection.  Improving. -Continue monitoring  Adult failure to thrive/moderate malnutrition-poor p.o. intake in the setting of adenocarcinoma. -Plan for G-tube on 8/1 Body mass index is 20.02 kg/m. Nutrition Problem: Moderate Malnutrition Etiology: chronic illness, cancer and cancer related treatments Signs/Symptoms: mild fat depletion, mild muscle depletion, moderate muscle depletion Interventions: Tube feeding   DVT prophylaxis:  SCDs Start: 08/14/20 1624  Code Status: Full code Family Communication: Patient and/or RN. Available if any question.  Level of care: Med-Surg Status is: Inpatient  Remains inpatient appropriate because:Ongoing diagnostic testing needed not appropriate for outpatient work up, IV treatments appropriate due to intensity of illness or inability to take PO, and Inpatient level of care appropriate due to severity of illness  Dispo: The patient is from: Home              Anticipated d/c is to: Home              Patient currently is not medically stable to d/c.   Difficult to place patient No       Consultants:  GI IR ID   Sch Meds:  Scheduled Meds:  amLODipine  5 mg Oral Daily   cholestyramine light  4 g Oral BID   feeding supplement  1 Container Oral TID BM   feeding supplement (PROSource TF)  45 mL Per Tube TID   ferrous sulfate  325 mg Oral Q breakfast   free water  250 mL Per Tube Q4H   insulin aspart  0-15 Units Subcutaneous TID WC   insulin aspart  0-5 Units Subcutaneous QHS   insulin aspart  3 Units Subcutaneous Q4H   insulin detemir  30 Units  Subcutaneous BID   nystatin  5 mL Oral QID   pantoprazole  40 mg Intravenous Q1400   potassium chloride  40 mEq Per Tube Q4H   rosuvastatin  5 mg Oral Weekly   sodium chloride flush  5 mL Intracatheter Q8H   sodium chloride flush  5 mL Intracatheter Q8H   Continuous Infusions:  ampicillin-sulbactam (UNASYN) IV     anidulafungin     Followed by   Derrill Memo ON 08/27/2020] anidulafungin     feeding supplement (OSMOLITE 1.5 CAL) 1,000 mL (08/25/20 1919)   [START ON 08/27/2020] vancomycin     PRN Meds:.doxylamine (Sleep), hydrALAZINE, morphine injection, ondansetron (ZOFRAN) IV, oxyCODONE  Antimicrobials: Anti-infectives (From admission, onward)    Start     Dose/Rate Route Frequency Ordered Stop   08/27/20 1500  vancomycin (VANCOCIN) IVPB 1000 mg/200 mL premix        1,000 mg 200 mL/hr over 60 Minutes Intravenous To Radiology 08/24/20 1644 08/28/20 1500   08/27/20 1000  anidulafungin (ERAXIS) 100 mg in sodium chloride 0.9 % 100 mL IVPB       See Hyperspace for full Linked Orders Report.  100 mg 78 mL/hr over 100 Minutes Intravenous Daily 08/26/20 1115     08/26/20 1230  anidulafungin (ERAXIS) 200 mg in sodium chloride 0.9 % 200 mL IVPB       See Hyperspace for full Linked Orders Report.   200 mg 78 mL/hr over 200 Minutes Intravenous Once 08/26/20 1115     08/26/20 1230  Ampicillin-Sulbactam (UNASYN) 3 g in sodium chloride 0.9 % 100 mL IVPB        3 g 200 mL/hr over 30 Minutes Intravenous Every 6 hours 08/26/20 1147     08/24/20 1800  cefTRIAXone (ROCEPHIN) 2 g in sodium chloride 0.9 % 100 mL IVPB  Status:  Discontinued        2 g 200 mL/hr over 30 Minutes Intravenous Every 24 hours 08/24/20 1640 08/26/20 1147   08/24/20 1800  fluconazole (DIFLUCAN) IVPB 100 mg  Status:  Discontinued        100 mg 50 mL/hr over 60 Minutes Intravenous Every 24 hours 08/24/20 1640 08/26/20 1115   08/21/20 1100  metroNIDAZOLE (FLAGYL) tablet 500 mg  Status:  Discontinued        500 mg Oral Every 8 hours  08/21/20 1002 08/22/20 0744   08/19/20 1800  metroNIDAZOLE (FLAGYL) IVPB 500 mg  Status:  Discontinued        500 mg 100 mL/hr over 60 Minutes Intravenous Every 8 hours 08/19/20 1614 08/21/20 1002   08/17/20 0800  ciprofloxacin (CIPRO) tablet 500 mg        500 mg Oral 2 times daily 08/17/20 0411 08/21/20 2152   08/16/20 0600  cefOXitin (MEFOXIN) 2 g in sodium chloride 0.9 % 100 mL IVPB        2 g 200 mL/hr over 30 Minutes Intravenous To Radiology 08/14/20 1620 08/16/20 1704        I have personally reviewed the following labs and images: CBC: Recent Labs  Lab 08/22/20 0552 08/23/20 0606 08/24/20 0634 08/25/20 0718 08/26/20 0720  WBC 16.4* 15.9* 16.1* 15.3* 12.5*  HGB 11.8* 11.7* 10.7* 10.1* 10.2*  HCT 35.0* 34.0* 31.4* 30.0* 30.1*  MCV 87.1 87.0 88.0 90.4 89.9  PLT 294 294 259 235 256   BMP &GFR Recent Labs  Lab 08/21/20 0554 08/21/20 1528 08/21/20 1948 08/22/20 0552 08/22/20 1718 08/23/20 0606 08/24/20 0634 08/25/20 0718 08/26/20 0720  NA 146*  --   --  148*  --  150* 150* 148* 147*  K 3.1*  --   --  3.0*  --  3.3* 3.6 3.5 3.1*  CL 110  --   --  110  --  114* 115* 116* 116*  CO2 27  --   --  24  --  '26 24 24 23  '$ GLUCOSE 254*  --   --  341*  --  226* 229* 236* 103*  BUN 17  --   --  21  --  '19 14 15 15  '$ CREATININE 0.87  --   --  0.83  --  0.73 0.68 0.63 0.58*  CALCIUM 9.1  --   --  9.9  --  9.7 9.3 9.1 9.0  MG 2.0  --   --  2.4  --   --  2.1 2.1 2.2  PHOS  --  3.0 2.9 2.2* 2.4*  --   --   --  2.8   Estimated Creatinine Clearance: 81.3 mL/min (A) (by C-G formula based on SCr of 0.58 mg/dL (L)). Liver & Pancreas: Recent Labs  Lab  08/22/20 [id number] 08/23/20 0606 08/24/20 0634 08/25/20 0718 08/26/20 0720  AST 94* 109* 95* 95* 108*  ALT 116* 112* 95* 89* 91*  ALKPHOS 140* 149* 134* 134* 136*  BILITOT 17.6* 18.7* 16.0* 13.5* 11.5*  PROT 6.9 7.1 6.5 6.3* 6.4*  ALBUMIN 2.7* 2.8* 2.5* 2.4* 2.4*   No results for input(s): LIPASE, AMYLASE in the last 168  hours. No results for input(s): AMMONIA in the last 168 hours. Diabetic: No results for input(s): HGBA1C in the last 72 hours. Recent Labs  Lab 08/25/20 2025 08/26/20 0009 08/26/20 0354 08/26/20 0755 08/26/20 1145  GLUCAP 217* 159* 126* 89 134*   Cardiac Enzymes: No results for input(s): CKTOTAL, CKMB, CKMBINDEX, TROPONINI in the last 168 hours. No results for input(s): PROBNP in the last 8760 hours. Coagulation Profile: No results for input(s): INR, PROTIME in the last 168 hours. Thyroid Function Tests: No results for input(s): TSH, T4TOTAL, FREET4, T3FREE, THYROIDAB in the last 72 hours. Lipid Profile: No results for input(s): CHOL, HDL, LDLCALC, TRIG, CHOLHDL, LDLDIRECT in the last 72 hours. Anemia Panel: No results for input(s): VITAMINB12, FOLATE, FERRITIN, TIBC, IRON, RETICCTPCT in the last 72 hours. Urine analysis: No results found for: COLORURINE, APPEARANCEUR, LABSPEC, PHURINE, GLUCOSEU, HGBUR, BILIRUBINUR, KETONESUR, PROTEINUR, UROBILINOGEN, NITRITE, LEUKOCYTESUR Sepsis Labs: Invalid input(s): PROCALCITONIN, [place]  Microbiology: Recent Results (from the past 240 hour(s))  Body fluid culture w Gram Stain     Status: None   Collection Time: 08/22/20 11:12 AM   Specimen: BILE; Body Fluid  Result Value Ref Range Status   Specimen Description   Final    BILE Performed at [facility], [place] [address], [place], [place] [address]    Special Requests   Final    Normal Performed at [facility], [place] [address] [place], [place] [address]    Gram Stain   Final    NO WBC SEEN MODERATE YEAST FEW GRAM POSITIVE COCCI Performed at [facility], [place] [address] [place], [place] [address]    Culture   Final    MODERATE STAPHYLOCOCCUS EPIDERMIDIS MODERATE CANDIDA GLABRATA    Report Status 08/24/2020 FINAL  Final   Organism ID, Bacteria STAPHYLOCOCCUS EPIDERMIDIS  Final      Susceptibility   Staphylococcus epidermidis -  MIC*    CIPROFLOXACIN >=8 RESISTANT Resistant     ERYTHROMYCIN >=8 RESISTANT Resistant     GENTAMICIN <=0.5 SENSITIVE Sensitive     OXACILLIN >=4 RESISTANT Resistant     TETRACYCLINE >=16 RESISTANT Resistant     VANCOMYCIN 1 SENSITIVE Sensitive     TRIMETH/SULFA <=10 SENSITIVE Sensitive     CLINDAMYCIN >=8 RESISTANT Resistant     RIFAMPIN <=0.5 SENSITIVE Sensitive     Inducible Clindamycin NEGATIVE Sensitive     * MODERATE STAPHYLOCOCCUS EPIDERMIDIS    Radiology Studies: No results found.    Audrey Thull T. [place]  If 7PM-7AM, please contact night-coverage [email or web address] 08/26/2020, 12:16 PM

## 2020-08-26 NOTE — Progress Notes (Signed)
MD, Please check CBG orders with scheduled novolog. Does patient need to be q4h or ACHS?

## 2020-08-26 NOTE — Consult Note (Signed)
[facility]    Date of Admission:  08/14/2020     Reason for Consult: fungemia    Referring Provider: Cyndia Skeeters   Lines:  Peripheral  7/28-c Biliary int/ext drain  Abx: 7/31-c anidulafungin 7/31-c amp-sulb  7/29-31 fluconazole 7/29-31 ceftriaxone 7/24-31 metronidazole 7/22-26 cipro        Assessment: Biliary sepsis/cholangitis Pancreatic cancer with biliary obstruction   No blood cultures were done this admission Clinical pictures suggestive of biliary sepsis/cholangitis, now s/p successful placement internal/external biliary drain with clinical improvement  Would finish 5 day of treatment for biliary sepsis with amp/sulbactam/anidulofungin. No need to staph epi coverage as that is a colonizer from the drain  Would defer echo at this time or other approach for BSI (outside of check blood culture) as no proven bacteremia/fungemia happened  Plan: Check blood culture  Called lab to check candida glabrata susceptibility in case long term abx needed  Change fluconazole to eraxis Ok with ceftriaxone/flagyl but team had changed to amp-sulb which is also fine No need to staph epi coverage As patient has functional internal-external drain, would treat for 5 days from today until 8/05 If blood cultures are positive or patient developes new sepsis, will need to reevaluate for source control and abx duration again Discussed with primary team  I spent 60 minute reviewing data/chart, and coordinating care and >50% direct face to face time providing counseling/discussing diagnostics/treatment plan with patient    ------------------------------------------------ Principal Problem:   Biliary obstruction Active Problems:   Pancreatic adenocarcinoma ([place])   Diabetes mellitus type 2 in nonobese ([place])   Essential hypertension   Non-intractable vomiting   Malnutrition of moderate degree    HPI: Jeff Jordan is a 65 y.o. male hypertension,  diabetes mellitus, recently dx'ed pancreatic mass admitted 7/19 for biliary stent placement due to biliary obstruction, course with leukocyosis and low grade temperature concerning for biliary infection, which id called  He has no f/c/rigors at home Sx just poor appetite, jaundice and intermittent n/v  He underwent ercp but attempt to place stent failed. Ultimately he had IR placement external biliary drain which was convered to internal/external biliary drain on 7/28  He has an episode low grade temp Overall had moderate lft elevation which is improving  An ngt was placed to tube feeding  During int/ext biliary drain conversion gallbladder fluid was sampled which grew candida glabrata and staph epi  He has no blood cx He was started on abx with the episode of fever as there was leukocytosis. This occurred just prior to conversion of ext drain into interna/ext drain   He is still fatigue and has poor appetite but other sx improving No focal joint pain No itch No headache No dysuria Gettign tube feed   Family History  Family history unknown: Yes    Social History   Tobacco Use   Smoking status: Former    Packs/day: 1.00    Years: 12.00    Pack years: 12.00    Types: Cigarettes    Quit date: 01/28/2000    Years since quitting: 20.5   Smokeless tobacco: Never  Vaping Use   Vaping Use: Never used  Substance Use Topics   Alcohol use: Not Currently    Comment: quit 2002 usednto drink alot   Drug use: Not Currently    No Known Allergies  Review of Systems: ROS All Other ROS was negative, except mentioned above   Past Medical History:  Diagnosis  Date   Biliary obstruction    Essential hypertension    Gastroesophageal reflux    Hepatic steatosis    Mild hyperlipidemia    Type 2 diabetes mellitus (H[facility])        Scheduled Meds:  amLODipine  5 mg Oral Daily   cholestyramine light  4 g Oral BID   feeding supplement  1 Container Oral TID BM   feeding supplement  (PROSource TF)  45 mL Per Tube TID   ferrous sulfate  325 mg Oral Q breakfast   free water  250 mL Per Tube Q4H   insulin aspart  0-15 Units Subcutaneous TID WC   insulin aspart  0-5 Units Subcutaneous QHS   insulin aspart  3 Units Subcutaneous Q4H   insulin detemir  30 Units Subcutaneous BID   nystatin  5 mL Oral QID   pantoprazole  40 mg Intravenous Q1400   potassium chloride  40 mEq Per Tube Q4H   sodium chloride flush  5 mL Intracatheter Q8H   sodium chloride flush  5 mL Intracatheter Q8H   Continuous Infusions:  ampicillin-sulbactam (UNASYN) IV     anidulafungin     Followed by   Derrill Memo ON 08/27/2020] anidulafungin     feeding supplement (OSMOLITE 1.5 CAL) 1,000 mL (08/25/20 1919)   [START ON 08/27/2020] vancomycin     PRN Meds:.doxylamine (Sleep), hydrALAZINE, morphine injection, ondansetron (ZOFRAN) IV, oxyCODONE   OBJECTIVE: Blood pressure 137/64, pulse (!) 103, temperature 98 F (36.7 C), temperature source Oral, resp. rate 16, height '5\' 10"'$  (1.778 m), weight 63.3 kg, SpO2 100 %.  Physical Exam General/constitutional: chronically ill appearing; jaundice; no acute distress HEENT: Normocephalic, PER, Conj Clear, EOMI, Oropharynx clear Neck supple CV: rrr no mrg Lungs: clear to auscultation, normal respiratory effort Abd: Soft, Nontender; drain bag showing bilious output Ext: no edema Skin: No Rash Neuro: nonfocal MSK: no peripheral joint swelling/tenderness/warmth; back spines nontender Psych alert/oriented  Central line presence: no    Lab Results Lab Results  Component Value Date   WBC 12.5 (H) 08/26/2020   HGB 10.2 (L) 08/26/2020   HCT 30.1 (L) 08/26/2020   MCV 89.9 08/26/2020   PLT 256 08/26/2020    Lab Results  Component Value Date   CREATININE 0.58 (L) 08/26/2020   BUN 15 08/26/2020   NA 147 (H) 08/26/2020   K 3.1 (L) 08/26/2020   CL 116 (H) 08/26/2020   CO2 23 08/26/2020    Lab Results  Component Value Date   ALT 91 (H) 08/26/2020   AST 108  (H) 08/26/2020   ALKPHOS 136 (H) 08/26/2020   BILITOT 11.5 (H) 08/26/2020      Microbiology: Recent Results (from the past 240 hour(s))  Body fluid culture w Gram Stain     Status: None   Collection Time: 08/22/20 11:12 AM   Specimen: BILE; Body Fluid  Result Value Ref Range Status   Specimen Description   Final    BILE Performed at [facility], [place] [address], [place], [place] [address]    Special Requests   Final    Normal Performed at [facility], [place] [address], [place], [place] [address]    Gram Stain   Final    NO WBC SEEN MODERATE YEAST FEW GRAM POSITIVE COCCI Performed at [facility], [place] [address], [place], [place] [address]    Culture   Final    MODERATE STAPHYLOCOCCUS EPIDERMIDIS MODERATE CANDIDA GLABRATA    Report Status  08/24/2020 FINAL  Final   Organism ID, Bacteria STAPHYLOCOCCUS EPIDERMIDIS  Final      Susceptibility   Staphylococcus epidermidis - MIC*    CIPROFLOXACIN >=8 RESISTANT Resistant     ERYTHROMYCIN >=8 RESISTANT Resistant     GENTAMICIN <=0.5 SENSITIVE Sensitive     OXACILLIN >=4 RESISTANT Resistant     TETRACYCLINE >=16 RESISTANT Resistant     VANCOMYCIN 1 SENSITIVE Sensitive     TRIMETH/SULFA <=10 SENSITIVE Sensitive     CLINDAMYCIN >=8 RESISTANT Resistant     RIFAMPIN <=0.5 SENSITIVE Sensitive     Inducible Clindamycin NEGATIVE Sensitive     * MODERATE STAPHYLOCOCCUS EPIDERMIDIS     Serology:    Imaging: If present, new imagings (plain films, ct scans, and mri) have been personally visualized and interpreted; radiology reports have been reviewed. Decision making incorporated into the Impression / Recommendations.  7/28 ir cholangiogram Fluoroscopically cholangiogram confirmed appropriate positioning and function of internal external biliary drain.   7/22 IR conversion perc bil drain to int/ext drain 1. Hemobilia with thrombus in the dilated bile ducts. This is likely the primary  reason for relatively poor drainage through the prior 10 [place] biliary drainage catheter. 2. Successful internalization and placement of a larger 12 [place] internal/external biliary drainage catheter.   7/21 ir placement percutaneous biliary drain Successful placement of a 10.2 [place] all-purpose drainage catheter with end coiled and locked within the dilated CBD.   7/21 ct pelvis No evidence of metastatic disease in the pelvis.   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   7/21 ct chest 4 mm (mean diameter) subpleural nodule in the anterior right upper lobe. This is not considered overly suspicious for isolated pulmonary metastasis, but warrants attention on follow-up.   No findings specific for metastatic disease in the chest.   Stable abdominal findings related to known pancreatic head malignancy with additional lesion in the pancreatic tail.   Aortic Atherosclerosis (ICD10-I70.0) and Emphysema   7/19 ercp FINDINGS: Imaging with a C-arm demonstrates attempted cannulation of the ampulla and common bile duct. Cholangiogram was not able to be performed.   IMPRESSION: Attempted cannulation of the ampulla and common bile duct.   These images were submitted for radiologic interpretation only. Please see the procedural report for the amount of contrast and the fluoroscopy time utilized.  Jabier Mutton, [facility] [place] [phone] pager    08/26/2020, 12:58 PM

## 2020-08-26 NOTE — H&P (Signed)
Chief Complaint: Metastatic pancreatic adenocarcinoma with duodenal stenosis, and biliary obstruction. Request is for GJ tube placement.   Referring Physician(s): Dr. E [place]  Supervising Physician: Daryll Brod  Patient Status: [facility] - In-pt  History of Present Illness: Jeff Jordan is a 65 y.o. male History of HTN, DM. Recently diagnosed metastatic pancreatic adenocarcinoma with duodenal stenosis and biliary obstruction. Due to duodenal stenosis GI was unable to placed a biliary stent. IR placed an internal external biliary drain on 7.21.22. The drain was internalized by IR on 7.22.22. The patient's total bilirubin as found to be rising and on 7.27.22 a cholangiogram was performed showing the biliary drain in good position. The drain is currently to a gravity bag. Total bilirubin continues to downtrend 11.5>>13.5>>16>>18.7, AST 108, ALT 91, total protein 91. Potassium 3.1, WBC 12.5. Cultures from bile collected on 7.27.22 resulted on moderate staphylococcus endepidermis. Patient is currently receiving nutrition via NG tube. Team is requesting a GJ tube placement.   Patient alert and laying in bed, calm  He denies any fevers, headache, chest pain, SOB, cough, abdominal pain, nausea, vomiting or bleeding but states that he has his "ups and downs"Return precautions and treatment recommendations and follow-up discussed with the patient who is agreeable with the plan.   Past Medical History:  Diagnosis Date   Biliary obstruction    Essential hypertension    Gastroesophageal reflux    Hepatic steatosis    Mild hyperlipidemia    Type 2 diabetes mellitus [facility])     Past Surgical History:  Procedure Laterality Date   BIOPSY  08/14/2020   Procedure: BIOPSY;  Surgeon: Rush Landmark Telford Nab., MD;  Location: [facility];  Service: Gastroenterology;;   CATARACT EXTRACTION Bilateral    ENDOSCOPIC RETROGRADE CHOLANGIOPANCREATOGRAPHY (ERCP) WITH PROPOFOL N/A 08/14/2020   Procedure:  ENDOSCOPIC RETROGRADE CHOLANGIOPANCREATOGRAPHY (ERCP) WITH PROPOFOL;  Surgeon: Irving Copas., MD;  Location: [facility];  Service: Gastroenterology;  Laterality: N/A;  aborted ercp   ESOPHAGOGASTRODUODENOSCOPY N/A 08/14/2020   Procedure: ESOPHAGOGASTRODUODENOSCOPY (EGD);  Surgeon: Irving Copas., MD;  Location: [facility];  Service: Gastroenterology;  Laterality: N/A;   EUS  08/14/2020   Procedure: UPPER ENDOSCOPIC ULTRASOUND (EUS) LINEAR;  Surgeon: Irving Copas., MD;  Location: [facility];  Service: Gastroenterology;;   FINE NEEDLE ASPIRATION  08/14/2020   Procedure: FINE NEEDLE ASPIRATION (FNA) LINEAR;  Surgeon: Irving Copas., MD;  Location: [facility];  Service: Gastroenterology;;   IR BILIARY DRAIN PLACEMENT WITH CHOLANGIOGRAM  08/16/2020   IR CHOLANGIOGRAM EXISTING TUBE  08/23/2020   IR CONVERT BILIARY DRAIN TO INT EXT BILIARY DRAIN  08/17/2020   NECK SURGERY      Allergies: Patient has no known allergies.  Medications: Prior to Admission medications   Medication Sig Start Date End Date Taking? Authorizing Provider  amLODipine (NORVASC) 5 MG tablet Take 5 mg by mouth daily. 06/19/20  Yes [provider]  aspirin 81 MG EC tablet Take 81 mg by mouth daily.   Yes [provider]  ferrous sulfate 325 (65 FE) MG tablet Take 325 mg by mouth daily with breakfast.   Yes [provider]  JARDIANCE 25 MG TABS tablet Take 25 mg by mouth daily. 07/23/20  Yes [provider]  loperamide (IMODIUM) 2 MG capsule Take by mouth as needed for diarrhea or loose stools.   Yes [provider]  metFORMIN (GLUCOPHAGE) 1000 MG tablet Take 1,000 mg by mouth 2 (two) times daily. 07/16/20  Yes [provider]  olmesartan (BENICAR) 40  MG tablet Take 40 mg by mouth daily. 06/25/20  Yes [provider]  omeprazole (PRILOSEC) 40 MG capsule Take 40 mg by mouth every morning. 03/01/20  Yes [provider]   ondansetron (ZOFRAN) 4 MG tablet Take 1 tablet (4 mg total) by mouth every 4 (four) hours as needed for nausea. 08/02/20  Yes Dayton Scrape A, NP  Oxycodone HCl 10 MG TABS Take 1 tablet (10 mg total) by mouth every 4 (four) hours as needed. Patient taking differently: Take 10 mg by mouth every 4 (four) hours as needed (pain). 08/02/20  Yes Dayton Scrape A, NP  pioglitazone (ACTOS) 45 MG tablet Take 45 mg by mouth daily. 06/25/20  Yes [provider]  rosuvastatin (CRESTOR) 5 MG tablet Take 5 mg by mouth once a week. 06/25/20  Yes [provider]     Family History  Family history unknown: Yes    Social History   Socioeconomic History   Marital status: Divorced    Spouse name: Not on file   Number of children: 1   Years of education: Not on file   Highest education level: Not on file  Occupational History   Not on file  Tobacco Use   Smoking status: Former    Packs/day: 1.00    Years: 12.00    Pack years: 12.00    Types: Cigarettes    Quit date: 01/28/2000    Years since quitting: 20.5   Smokeless tobacco: Never  Vaping Use   Vaping Use: Never used  Substance and Sexual Activity   Alcohol use: Not Currently    Comment: quit 2002 usednto drink alot   Drug use: Not Currently   Sexual activity: Yes  Other Topics Concern   Not on file  Social History Narrative   Not on file   Social Determinants of Health   Financial Resource Strain: Not on file  Food Insecurity: Not on file  Transportation Needs: Not on file  Physical Activity: Not on file  Stress: Not on file  Social Connections: Not on file     Review of Systems: A 12 point ROS discussed and pertinent positives are indicated in the HPI above.  All other systems are negative.  Review of Systems  Constitutional:  Positive for fatigue. Negative for fever.  HENT:  Negative for congestion.   Respiratory:  Negative for cough and shortness of breath.   Cardiovascular:  Negative for chest pain.   Gastrointestinal:  Positive for abdominal pain.  Neurological:  Negative for headaches.  Psychiatric/Behavioral:  Negative for behavioral problems and confusion.    Vital Signs: BP 137/64   Pulse (!) 103   Temp 98 F (36.7 C) (Oral)   Resp 16   Ht '5\' 10"'$  (1.778 m)   Wt 139 lb 8.8 oz (63.3 kg)   SpO2 100%   BMI 20.02 kg/m   Physical Exam Constitutional:      Appearance: He is ill-appearing.  Eyes:     General: Scleral icterus present.  Cardiovascular:     Rate and Rhythm: Normal rate and regular rhythm.  Pulmonary:     Effort: Pulmonary effort is normal.     Breath sounds: Normal breath sounds.  Abdominal:     Comments: Present biliary drain to  gravity bag. Site is unremarkable with no erythema, edema, tenderness, bleeding or drainage noted at exit site. Suture and stat lock in place. Dressing is clean dry and intact. 50 ml of  bright yellow colored fluid noted in  gravity bag. Drain is able to be flushed easily.    Skin:    General: Skin is dry.  Psychiatric:        Mood and Affect: Mood normal.        Behavior: Behavior normal.        Thought Content: Thought content normal.        Judgment: Judgment normal.    Imaging: DG Abd 1 View  Result Date: 08/22/2020 CLINICAL DATA:  Confirm NG tube placement EXAM: ABDOMEN - 1 VIEW COMPARISON:  None. FINDINGS: There is a percutaneous biliary stent in place. There is a weighted tip enteric tube coiled with tip overlying the stomach the lung bases are clear. Nonobstructive upper abdominal bowel gas pattern. IMPRESSION: Weighted tip enteric tube is coiled in the left upper quadrant overlying the stomach. Electronically Signed   By: Maurine Simmering   On: 08/22/2020 20:16   DG Abd 1 View  Result Date: 08/22/2020 CLINICAL DATA:  Pt has biliary obstruction from pancreatic cancer, nausea, vomiting. EXAM: ABDOMEN - 1 VIEW COMPARISON:  08/21/2020 FINDINGS: Biliary tube in unchanged position. The bowel gas pattern is normal. No radio-opaque  calculi or other significant radiographic abnormality are seen. IMPRESSION: Biliary tube in unchanged position. Electronically Signed   By: Kathreen Devoid   On: 08/22/2020 14:49   DG Abd 1 View  Result Date: 08/21/2020 CLINICAL DATA:  Few feeding tube placement EXAM: ABDOMEN - 1 VIEW COMPARISON:  None. FINDINGS: Soft feeding tube enters the stomach, with its tip in the fundus. Drainage catheter present in the right upper abdomen. IMPRESSION: Soft feeding tube tip in the fundus of the stomach. Electronically Signed   By: Nelson Chimes M.D.   On: 08/21/2020 12:18   CT CHEST W CONTRAST  Result Date: 08/16/2020 CLINICAL DATA:  Pancreatic cancer, staging EXAM: CT CHEST WITH CONTRAST TECHNIQUE: Multidetector CT imaging of the chest was performed during intravenous contrast administration. CONTRAST:  57m OMNIPAQUE IOHEXOL 350 MG/ML SOLN COMPARISON:  Partial comparison to CT abdomen dated 07/27/2020 FINDINGS: Cardiovascular: Heart is normal in size.  No pericardial effusion. No evidence of thoracic aortic aneurysm. Atherosclerotic calcifications of the aortic arch. Mediastinum/Nodes: No suspicious mediastinal lymphadenopathy. Visualized thyroid is unremarkable. Lungs/Pleura: Mild bibasilar atelectasis. Mild centrilobular and paraseptal emphysematous changes, upper lung predominant. No focal consolidation. 5 x 3 mm subpleural nodule in the anterior right upper lobe (series 5/image 3). No pleural effusion or pneumothorax. Upper Abdomen: Moderate intrahepatic and extrahepatic ductal dilatation in this patient with known pancreatic head mass (not visualized). Stable 3.4 cm mass in the pancreatic tail extending to the splenic hilum (series 2/image 115), grossly unchanged. Heterogeneous enhancement of the bilateral kidneys, unchanged. Musculoskeletal: Cervical spine fixation hardware, incompletely visualized. Degenerative changes of the lower thoracic/upper lumbar spine. IMPRESSION: 4 mm (mean diameter) subpleural nodule in  the anterior right upper lobe. This is not considered overly suspicious for isolated pulmonary metastasis, but warrants attention on follow-up. No findings specific for metastatic disease in the chest. Stable abdominal findings related to known pancreatic head malignancy with additional lesion in the pancreatic tail. Aortic Atherosclerosis (ICD10-I70.0) and Emphysema (ICD10-J43.9). Electronically Signed   By: SJulian HyM.D.   On: 08/16/2020 15:20   CT PELVIS W CONTRAST  Result Date: 08/16/2020 CLINICAL DATA:  Pancreatic cancer, staging EXAM: CT PELVIS WITH CONTRAST TECHNIQUE: Multidetector CT imaging of the pelvis was performed using the standard protocol following the bolus administration of intravenous contrast. CONTRAST:  861mOMNIPAQUE IOHEXOL 350 MG/ML SOLN COMPARISON:  Partial  comparison to CT abdomen dated 07/27/2020 FINDINGS: Urinary Tract:  Thick-walled bladder. Bowel:  Sigmoid diverticulosis, without evidence of diverticulitis. Vascular/Lymphatic: No evidence of abdominal aortic aneurysm. Atherosclerotic calcifications of the abdominal aorta and branch vessels. No suspicious pelvic lymphadenopathy. Reproductive:  Prostatomegaly, suggesting BPH. Other:  Trace pelvic fluid (series 2/image 30). Musculoskeletal: No focal osseous lesions. IMPRESSION: No evidence of metastatic disease in the pelvis. 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 Electronically Signed   By: Julian Hy M.D.   On: 08/16/2020 15:21   DG ERCP  Result Date: 08/14/2020 CLINICAL DATA:  Pancreatic mass and biliary obstruction. EXAM: ERCP TECHNIQUE: Multiple spot images obtained with the fluoroscopic device and submitted for interpretation post-procedure. COMPARISON:  CT of the abdomen on 07/27/2020 at [place]: Imaging with a C-arm demonstrates attempted cannulation of the ampulla and common bile duct. Cholangiogram was not able to be performed. IMPRESSION: Attempted cannulation of  the ampulla and common bile duct. These images were submitted for radiologic interpretation only. Please see the procedural report for the amount of contrast and the fluoroscopy time utilized. Electronically Signed   By: Aletta Edouard M.D.   On: 08/14/2020 15:00   IR CHOLANGIOGRAM EXISTING TUBE  Result Date: 08/23/2020 INDICATION: 65 year old gentleman with history of metastatic pancreatic cancer status post external biliary drain placement on 08/16/2020 followed by conversion to internal external biliary drain on 08/17/2020 returns to interventional radiology given rising bilirubin. EXAM: Fluoroscopic biliary drain check MEDICATIONS: None ANESTHESIA/SEDATION: None FLUOROSCOPY TIME:  Fluoroscopy Time: 0 minutes 30 seconds (4 mGy). COMPLICATIONS: None immediate. PROCEDURE: Informed written consent was obtained from the patient after a thorough discussion of the procedural risks, benefits and alternatives. Patient positioned supine on the procedure table. Scout image demonstrated the internal external biliary drain in appropriate unchanged position. Contrast administered through the drain confirmed patency. Drain flushed and reattached to bag. IMPRESSION: Fluoroscopically cholangiogram confirmed appropriate positioning and function of internal external biliary drain. Electronically Signed   By: Miachel Roux M.D.   On: 08/23/2020 16:21   IR BILIARY DRAIN PLACEMENT WITH CHOLANGIOGRAM  Result Date: 08/16/2020 INDICATION: Concern for metastatic pancreatic cancer, now with obstructive jaundice post failed attempt at internal biliary stent placement. Patient presents today for biliary drain placement. EXAM: ULTRASOUND AND FLUOROSCOPIC GUIDED PERCUTANEOUS TRANSHEPATIC CHOLANGIOGRAM AND BILIARY TUBE PLACEMENT COMPARISON:  CT abdomen pelvis-07/27/2020; chest CT-08/16/2020; ERCP-7/19/2 MEDICATIONS: Patient is currently admitted to hospital receiving intravenous antibiotics; The antibiotic was administered with an  appropriate time frame prior to the initiation of the procedure CONTRAST:  21m OMNIPAQUE IOHEXOL 300 MG/ML SOLN - administered into the biliary tree. ANESTHESIA/SEDATION: Moderate (conscious) sedation was employed during this procedure. A total of Versed 2 mg and Fentanyl 200 mcg was administered intravenously. Moderate Sedation Time: 29 minutes. The patient's level of consciousness and vital signs were monitored continuously by radiology nursing throughout the procedure under my direct supervision. FLUOROSCOPY TIME:  4 minutes, 18 seconds (10000000mGy) COMPLICATIONS: None immediate. TECHNIQUE: Informed written consent was obtained from the patient after a discussion of the risks, benefits and alternatives to treatment. Questions regarding the procedure were encouraged and answered. A timeout was performed prior to the initiation of the procedure. The right upper abdominal quadrant was prepped and draped in the usual sterile fashion, and a sterile drape was applied covering the operative field. Maximum barrier sterile technique with sterile gowns and gloves were used for the procedure. A timeout was performed prior to the initiation of the procedure. Ultrasound scanning of the right  upper abdominal quadrant was performed to delineate the anatomy and avoid transgression of the gallbladder or the pleural. A spot along the right mid axillary line was marked fluoroscopically inferior to the right costophrenic angle. After the overlying soft tissues were anesthetized with 1% Lidocaine with epinephrine, under direct ultrasound guidance, a 22 gauge needle was utilized to cannulate the peripheral aspect of a right intrahepatic biliary duct. Appropriate position was confirmed with limited contrast injection. Next, the duct was cannulated with a Nitrex wire and dilated with an Accustick set under fluoroscopic guidance. Limited cholangiograms were performed in various obliquities confirming appropriate access. Attempts were made  with a 4 French angled glide catheter to advance both a Glidewire beyond the obstruction of the distal aspect of the CBD, however this ultimately proved unsuccessful in part due to the patient's procedure related discomfort. As such, over a short Amplatz wire, the track was dilated ultimately allowing placement of a 10 French standard all-purpose drainage catheter with end coiled locked within the dilated CBD. Postprocedural spot fluoroscopic images were obtained. Dressings were applied. The patient tolerated the procedure well without immediate postprocedural complication though was experiencing expected postprocedural discomfort. FINDINGS: Sonographic evaluation of the liver demonstrates moderate intrahepatic biliary ductal dilatation as was demonstrated on preceding abdominal CT. Under direct ultrasound guidance, a dilated peripheral duct within the anterior segment of the right lobe of the liver was accessed allowing placement of a 10 French standard all-purpose drainage catheter with end coiled and locked within the dilated duodenum. Contrast injection demonstrates complete occlusion of the distal aspect of the CBD. There is minimal opacification of the cystic duct and gallbladder without definitive opacification of the left intrahepatic biliary tree. Limited attempts at traversing the distal obstruction of the CBD proved unsuccessful in part due to the patient's discomfort following drainage catheter placement. IMPRESSION: Successful placement of a 10.2 [place] all-purpose drainage catheter with end coiled and locked within the dilated CBD. PLAN: - Maintain external biliary drainage catheter to gravity bag. - Obtain daily CMPs - Once patient's bilirubin has significantly normalized (likely early next week), the patient may return for definitive cholangiogram and attempt at internalization of the drainage catheter. Note, this procedure may be performed at an outpatient basis though does require conscious  sedation. Electronically Signed   By: Sandi Mariscal M.D.   On: 08/16/2020 17:10   IR CONVERT BILIARY DRAIN TO INT EXT BILIARY DRAIN  Result Date: 08/17/2020 INDICATION: 65 year old male with pancreatic cancer and malignant obstructed jaundice. He underwent placement of an external biliary drainage catheter yesterday. Unfortunately, his bilirubin continues to increase. Therefore, he presents for biliary drain evaluation and attempted internalization. EXAM: IR CONVERT BILIARY DRAIN TO INT-EXT BILARY DRAIN MEDICATIONS: None. ANESTHESIA/SEDATION: Moderate (conscious) sedation was employed during this procedure. A total of Versed 4 mg and Fentanyl 100 mcg and 1 mg Dilaudid was administered intravenously. Moderate Sedation Time: 19 minutes. The patient's level of consciousness and vital signs were monitored continuously by radiology nursing throughout the procedure under my direct supervision. FLUOROSCOPY TIME:  Fluoroscopy Time: 3 minutes 42 seconds (57 mGy). COMPLICATIONS: None immediate. PROCEDURE: Informed written consent was obtained from the patient after a thorough discussion of the procedural risks, benefits and alternatives. All questions were addressed. Maximal Sterile Barrier Technique was utilized including caps, mask, sterile gowns, sterile gloves, sterile drape, hand hygiene and skin antiseptic. A timeout was performed prior to the initiation of the procedure. Initial contrast injection through the existing external 10 [place] biliary drainage catheter demonstrates persistent biliary ductal  dilatation. Additionally, there are diffuse filling defects within the bile duct consistent with thrombus. The retention suture was cut. The existing tube was transected and removed over a road runner wire. Local anesthesia was attained by infiltration with 1% lidocaine. A 9 French sheath was shaped into a curve and advanced over the wire into the biliary tree. A 5 French angled catheter was then advanced over the wire  into the distal common bile duct. Gentle probing with the hydrophilic road runner wire was performed in till the channel through the obstructed distal common bile duct was identified. The wire and catheter combo was advanced through the obstruction and into the duodenum. Contrast injection through the 5 French catheter confirms its location within the horizontal duodenum. A superstiff Amplatz wire was then advanced into the duodenum. The 5 French catheter and 9 French sheath were removed. The transhepatic tract was dilated to 12 [place]. A 12 [place] biliary drain was then advanced over the wire and formed. The locking loop is within the duodenum. The side hole spanned the common bile duct and proximal right intrahepatic ducts. The catheter was gently flushed and then secured to the skin with 0 Prolene suture. The catheter was connected to gravity bag drainage. IMPRESSION: 1. Hemobilia with thrombus in the dilated bile ducts. This is likely the primary reason for relatively poor drainage through the prior 10 [place] biliary drainage catheter. 2. Successful internalization and placement of a larger 12 French internal/external biliary drainage catheter. PLAN: 1. Maintain drain to gravity bag drainage. Once bilirubin has significantly improved, the drain may be capped. 2. Flush drainage catheter at least once per shift to help clear thrombus from the biliary system. 3. As thrombus is cleared over the coming days, expect significantly increased biliary output and improvement in serum bilirubin. Signed, Criselda Peaches, MD, [facility] Electronically Signed   By: Jacqulynn Cadet M.D.   On: 08/17/2020 17:11    Labs:  CBC: Recent Labs    08/23/20 0606 08/24/20 0634 08/25/20 0718 08/26/20 0720  WBC 15.9* 16.1* 15.3* 12.5*  HGB 11.7* 10.7* 10.1* 10.2*  HCT 34.0* 31.4* 30.0* 30.1*  PLT 294 259 235 256    COAGS: Recent Labs    08/14/20 1934  08/15/20 0452 08/16/20 0553  INR 1.7* 1.5* 1.1    BMP: Recent Labs    08/23/20 0606 08/24/20 0634 08/25/20 0718 08/26/20 0720  NA 150* 150* 148* 147*  K 3.3* 3.6 3.5 3.1*  CL 114* 115* 116* 116*  CO2 '26 24 24 23  '$ GLUCOSE 226* 229* 236* 103*  BUN '19 14 15 15  '$ CALCIUM 9.7 9.3 9.1 9.0  CREATININE 0.73 0.68 0.63 0.58*  GFRNONAA >60 >60 >60 >60    LIVER FUNCTION TESTS: Recent Labs    08/23/20 0606 08/24/20 0634 08/25/20 0718 08/26/20 0720  BILITOT 18.7* 16.0* 13.5* 11.5*  AST 109* 95* 95* 108*  ALT 112* 95* 89* 91*  ALKPHOS 149* 134* 134* 136*  PROT 7.1 6.5 6.3* 6.4*  ALBUMIN 2.8* 2.5* 2.4* 2.4*     Assessment and Plan: 65 y.o. male inpatient. History of HTN, DM. Recently diagnosed metastatic pancreatic adenocarcinoma with duodenal stenosis and biliary obstruction. Due to duodenal stenosis GI was unable to placed a biliary stent. IR placed an internal external biliary drain on 7.21.22. The drain was internalized by IR on 7.22.22. The patient's total bilirubin as found to be rising and on 7.27.22 a cholangiogram was performed showing the biliary drain in  good position. The drain is currently to a gravity bag. Total bilirubin continues to downtrend 11.5>>13.5>>16>>18.7, AST 108, ALT 91, total protein 91. Potassium 3.1, WBC 12.5. Cultures from bile collected on 7.27.22 resulted on moderate staphylococcus endepidermis. Patient is currently receiving nutrition via NG tube. Team is requesting a GJ tube placement.  IR consulted for possible GJ tube placement. Case has been reviewed and procedure approved by Dr. Pascal Lux.  Patient tentatively scheduled for 8.1.22.  Team instructed to: Keep Patient to be NPO after midnight Hold prophylactic anticoagulation 24 hours prior to scheduled procedure.  IR will call patient when ready.  Risks and benefits image guided gastrostomy  jejunostomy tube placement was discussed with the patient including, but not limited to the need for a barium  enema during the procedure, bleeding, infection, peritonitis and/or damage to adjacent structures.  All of the patient's questions were answered, patient is agreeable to proceed.  Consent signed and in chart.   Pertinent Biliary Drain  Information Drain Location: RUQ Size: Fr size: 12 Fr Date of placement: 7.21.22 converted to an internal external on 7.22.22  Currently to: Drain collection device: gravity 24 hour output:  Output by Drain (mL) 08/24/20 0701 - 08/24/20 1900 08/24/20 1901 - 08/25/20 0700 08/25/20 0701 - 08/25/20 1900 08/25/20 1901 - 08/26/20 0700 08/26/20 0701 - 08/26/20 1331  Biliary Tube Cook slip-coat 12 Fr. RUQ 400 250 200 425 375    Interval imaging/drain manipulation:  Cholangiogram performed on 7.28.22 shows biliary drain to be in good position   Current examination: Flushes/aspirates easily.  Insertion site unremarkable. Suture and stat lock in place. Dressed appropriately.  50 ml output that is bright yellow in nature Total Bilirubin 11.5>>13.5>>16>>18.7  Plan: Continue TID flushes with 5 cc NS. Record output Q shift. Dressing changes QD or PRN if soiled.  Call IR APP or on call IR MD if difficulty flushing or sudden change in drain output.    Discharge planning: Please contact IR APP or on call IR MD prior to patient d/c to ensure appropriate follow up plans are in place. Typically patient will follow up with IR clinic 4-6 weeks days post d/c for repeat imaging/possible drain injection. IR scheduler will contact patient with date/time of appointment. Patient will need to flush drain QD with 5 cc NS, record output QD, dressing changes every 2-3 days or earlier if soiled.   IR will continue to follow - please call with questions or concerns.   Thank you for this interesting consult.  I greatly enjoyed meeting Jeff Jordan and look forward to participating in their care.  A copy of this report was sent to the requesting provider on this  date.  Electronically Signed: Jacqualine Mau, NP 08/26/2020, 1:23 PM   I spent a total of 40 Minutes in face to face in clinical consultation, greater than 50% of which was counseling/coordinating care for GJ tube placement and Bilary drain evaluation

## 2020-08-27 ENCOUNTER — Inpatient Hospital Stay (HOSPITAL_COMMUNITY): Payer: Medicare HMO

## 2020-08-27 DIAGNOSIS — R112 Nausea with vomiting, unspecified: Secondary | ICD-10-CM | POA: Diagnosis not present

## 2020-08-27 DIAGNOSIS — E440 Moderate protein-calorie malnutrition: Secondary | ICD-10-CM | POA: Diagnosis not present

## 2020-08-27 DIAGNOSIS — K831 Obstruction of bile duct: Secondary | ICD-10-CM | POA: Diagnosis not present

## 2020-08-27 DIAGNOSIS — C259 Malignant neoplasm of pancreas, unspecified: Secondary | ICD-10-CM | POA: Diagnosis not present

## 2020-08-27 DIAGNOSIS — K8309 Other cholangitis: Secondary | ICD-10-CM | POA: Diagnosis not present

## 2020-08-27 HISTORY — PX: IR GASTROSTOMY TUBE MOD SED: IMG625

## 2020-08-27 LAB — COMPREHENSIVE METABOLIC PANEL
ALT: 103 U/L — ABNORMAL HIGH (ref 0–44)
AST: 164 U/L — ABNORMAL HIGH (ref 15–41)
Albumin: 2.3 g/dL — ABNORMAL LOW (ref 3.5–5.0)
Alkaline Phosphatase: 140 U/L — ABNORMAL HIGH (ref 38–126)
Anion gap: 6 (ref 5–15)
BUN: 14 mg/dL (ref 8–23)
CO2: 25 mmol/L (ref 22–32)
Calcium: 8.9 mg/dL (ref 8.9–10.3)
Chloride: 117 mmol/L — ABNORMAL HIGH (ref 98–111)
Creatinine, Ser: 0.38 mg/dL — ABNORMAL LOW (ref 0.61–1.24)
GFR, Estimated: >60 mL/min (ref >60)
Glucose, Bld: 124 mg/dL — ABNORMAL HIGH (ref 70–99)
Potassium: 5.1 mmol/L (ref 3.5–5.1)
Sodium: 148 mmol/L — ABNORMAL HIGH (ref 135–145)
Total Bilirubin: 12.6 mg/dL — ABNORMAL HIGH (ref 0.3–1.2)
Total Protein: 6.3 g/dL — ABNORMAL LOW (ref 6.5–8.1)

## 2020-08-27 LAB — CBC WITH DIFFERENTIAL/PLATELET
Abs Immature Granulocytes: 0.37 10*3/uL — ABNORMAL HIGH (ref 0.00–0.07)
Basophils Absolute: 0.1 10*3/uL (ref 0.0–0.1)
Basophils Relative: 0 %
Eosinophils Absolute: 0.1 10*3/uL (ref 0.0–0.5)
Eosinophils Relative: 1 %
HCT: 30.9 % — ABNORMAL LOW (ref 39.0–52.0)
Hemoglobin: 10.3 g/dL — ABNORMAL LOW (ref 13.0–17.0)
Immature Granulocytes: 3 %
Lymphocytes Relative: 9 %
Lymphs Abs: 1.3 10*3/uL (ref 0.7–4.0)
MCH: 30.7 pg (ref 26.0–34.0)
MCHC: 33.3 g/dL (ref 30.0–36.0)
MCV: 92.2 fL (ref 80.0–100.0)
Monocytes Absolute: 1.1 10*3/uL — ABNORMAL HIGH (ref 0.1–1.0)
Monocytes Relative: 8 %
Neutro Abs: 10.5 10*3/uL — ABNORMAL HIGH (ref 1.7–7.7)
Neutrophils Relative %: 79 %
Platelets: 260 10*3/uL (ref 150–400)
RBC: 3.35 MIL/uL — ABNORMAL LOW (ref 4.22–5.81)
RDW: 25.6 % — ABNORMAL HIGH (ref 11.5–15.5)
WBC: 13.4 10*3/uL — ABNORMAL HIGH (ref 4.0–10.5)
nRBC: 0 % (ref 0.0–0.2)

## 2020-08-27 LAB — GLUCOSE, CAPILLARY
Glucose-Capillary: 93 mg/dL (ref 70–99)
Glucose-Capillary: 153 mg/dL — ABNORMAL HIGH (ref 70–99)
Glucose-Capillary: 42 mg/dL — CL (ref 70–99)
Glucose-Capillary: 45 mg/dL — ABNORMAL LOW (ref 70–99)
Glucose-Capillary: 102 mg/dL — ABNORMAL HIGH (ref 70–99)
Glucose-Capillary: 101 mg/dL — ABNORMAL HIGH (ref 70–99)
Glucose-Capillary: 115 mg/dL — ABNORMAL HIGH (ref 70–99)
Glucose-Capillary: 154 mg/dL — ABNORMAL HIGH (ref 70–99)

## 2020-08-27 LAB — MAGNESIUM: Magnesium: 2.3 mg/dL (ref 1.7–2.4)

## 2020-08-27 LAB — PROTIME-INR
INR: 1.2 (ref 0.8–1.2)
Prothrombin Time: 15 seconds (ref 11.4–15.2)

## 2020-08-27 LAB — PHOSPHORUS: Phosphorus: 3.2 mg/dL (ref 2.5–4.6)

## 2020-08-27 MED ORDER — MIDAZOLAM HCL 2 MG/2ML IJ SOLN
INTRAMUSCULAR | Status: AC
  Filled 2020-08-27: qty 4

## 2020-08-27 MED ORDER — POTASSIUM CHLORIDE 10 MEQ/100ML IV SOLN
10.0000 meq | INTRAVENOUS | Status: AC
Start: 2020-08-27 — End: 2020-08-27
  Administered 2020-08-27 (×2): 10 meq via INTRAVENOUS
  Filled 2020-08-27 (×2): qty 100

## 2020-08-27 MED ORDER — IOHEXOL 300 MG/ML  SOLN
100.0000 mL | Freq: Once | INTRAMUSCULAR | Status: AC | PRN
  Administered 2020-08-27: 60 mL

## 2020-08-27 MED ORDER — HYDROMORPHONE HCL 1 MG/ML IJ SOLN
INTRAMUSCULAR | Status: AC
  Filled 2020-08-27: qty 1

## 2020-08-27 MED ORDER — DEXTROSE-NACL 5-0.45 % IV SOLN
INTRAVENOUS | Status: DC
  Administered 2020-08-27: 1000 mL via INTRAVENOUS

## 2020-08-27 MED ORDER — LIDOCAINE HCL (PF) 1 % IJ SOLN
INTRAMUSCULAR | Status: AC | PRN
  Administered 2020-08-27: 10 mL via INTRADERMAL

## 2020-08-27 MED ORDER — DEXTROSE 50 % IV SOLN
INTRAVENOUS | Status: AC
  Filled 2020-08-27: qty 50

## 2020-08-27 MED ORDER — VANCOMYCIN HCL IN DEXTROSE 1-5 GM/200ML-% IV SOLN
INTRAVENOUS | Status: AC
  Administered 2020-08-27: 1000 mg via INTRAVENOUS
  Filled 2020-08-27: qty 200

## 2020-08-27 MED ORDER — MIDAZOLAM HCL 2 MG/2ML IJ SOLN
INTRAMUSCULAR | Status: AC | PRN
  Administered 2020-08-27: 2 mg via INTRAVENOUS

## 2020-08-27 MED ORDER — LIDOCAINE HCL 1 % IJ SOLN
INTRAMUSCULAR | Status: AC
  Filled 2020-08-27: qty 20

## 2020-08-27 MED ORDER — DEXTROSE 50 % IV SOLN
25.0000 g | INTRAVENOUS | Status: AC
  Administered 2020-08-27: 25 g via INTRAVENOUS

## 2020-08-27 MED ORDER — HYDROMORPHONE HCL 1 MG/ML IJ SOLN
INTRAMUSCULAR | Status: AC | PRN
  Administered 2020-08-27 (×2): 0.5 mg via INTRAVENOUS

## 2020-08-27 NOTE — Progress Notes (Signed)
[facility] for Infectious Disease  Date of Admission:  08/14/2020           Reason for visit: Follow up on biliary sepsis  Current antibiotics: Eraxis Unasyn    ASSESSMENT:    Biliary sepsis/cholangitis s/p internal/external drain placement 7/28 Pancreatic cancer with biliary obstruction  PLAN:    Continue Eraxis and Unasyn x 5 days for biliary sepsis.  End date 08/30/20 Abx stop dates entered Will sign off.  Please call back if questions or if blood cultures turn positive necessitating longer duration of treatment.    Principal Problem:   Biliary obstruction Active Problems:   Pancreatic adenocarcinoma (HCC)   Diabetes mellitus type 2 in nonobese [facility])   Essential hypertension   Non-intractable vomiting   Malnutrition of moderate degree   Cholangitis    MEDICATIONS:    Scheduled Meds:  amLODipine  5 mg Oral Daily   cholestyramine light  4 g Oral BID   feeding supplement  1 Container Oral TID BM   feeding supplement (PROSource TF)  45 mL Per Tube TID   ferrous sulfate  325 mg Oral Q breakfast   free water  250 mL Per Tube Q4H   insulin aspart  0-15 Units Subcutaneous TID WC   insulin aspart  0-5 Units Subcutaneous QHS   insulin aspart  3 Units Subcutaneous Q4H   insulin detemir  30 Units Subcutaneous BID   nystatin  5 mL Oral QID   pantoprazole  40 mg Intravenous Q1400   sodium chloride flush  5 mL Intracatheter Q8H   sodium chloride flush  5 mL Intracatheter Q8H   Continuous Infusions:  ampicillin-sulbactam (UNASYN) IV 3 g (08/27/20 0627)   anidulafungin 100 mg (08/27/20 1046)   dextrose 5 % and 0.9% NaCl 100 mL/hr at 08/27/20 0446   feeding supplement (OSMOLITE 1.5 CAL) Stopped (08/26/20 2359)   vancomycin     PRN Meds:.doxylamine (Sleep), hydrALAZINE, morphine injection, ondansetron (ZOFRAN) IV, oxyCODONE  SUBJECTIVE:   24 hour events:  Hypoglycemic event noted Afebrile LFTs pending today CBC pending today  Patient denies fevers,  chills, N/V/D.  He has some discomfort at the int/ext biliary drain site but otherwise no complaints.   Review of Systems  All other systems reviewed and are negative.    OBJECTIVE:   Blood pressure (!) 143/60, pulse (!) 102, temperature 97.7 F (36.5 C), temperature source Oral, resp. rate 14, height '5\' 10"'$  (1.778 m), weight 61.2 kg, SpO2 100 %. Body mass index is 19.36 kg/m.  Physical Exam Constitutional:      General: He is not in acute distress.    Appearance: Normal appearance.  HENT:     Head: Normocephalic and atraumatic.  Eyes:     General: Scleral icterus present.  Pulmonary:     Effort: Pulmonary effort is normal. No respiratory distress.  Abdominal:     General: There is no distension.     Palpations: Abdomen is soft.     Tenderness: There is no abdominal tenderness.     Comments: Biliary drain with bilious fluid in bag.   Neurological:     General: No focal deficit present.     Mental Status: He is alert and oriented to person, place, and time.  Psychiatric:        Mood and Affect: Mood normal.        Behavior: Behavior normal.     Lab Results: Lab Results  Component Value Date   WBC 12.5 (  H) 08/26/2020   HGB 10.2 (L) 08/26/2020   HCT 30.1 (L) 08/26/2020   MCV 89.9 08/26/2020   PLT 256 08/26/2020    Lab Results  Component Value Date   NA 147 (H) 08/26/2020   K 3.1 (L) 08/26/2020   CO2 23 08/26/2020   GLUCOSE 103 (H) 08/26/2020   BUN 15 08/26/2020   CREATININE 0.58 (L) 08/26/2020   CALCIUM 9.0 08/26/2020   GFRNONAA >60 08/26/2020    Lab Results  Component Value Date   ALT 91 (H) 08/26/2020   AST 108 (H) 08/26/2020   ALKPHOS 136 (H) 08/26/2020   BILITOT 11.5 (H) 08/26/2020    No results found for: CRP  No results found for: ESRSEDRATE   I have reviewed the micro and lab results in Epic.  Imaging: No results found.   Imaging independently reviewed in Epic.    Raynelle [facility] for Infectious Disease [facility] [phone] pager 08/27/2020, 11:16 AM  I spent greater than 35 minutes with the patient including greater than 50% of time in face to face counsel of the patient and in coordination of their care.

## 2020-08-27 NOTE — Progress Notes (Signed)
Went to administer medications last night and panda tube was clogged. I was unable to flush tube. NP on call was notified. Potassium was given IV. Patient will be getting g-tube placed today, but please evaluate panda tube today if g-tube not placed. Jeff Jordan

## 2020-08-27 NOTE — Progress Notes (Signed)
Hypoglycemic Event  CBG: 42  Treatment: D50 50 mL (25 gm)  Symptoms: None  Follow-up CBG: Time:0500 CBG Result:153  Possible Reasons for Event: NPO  Comments/MD notified:NP on call notified, amp of dextrose given, and fluids changed.     Roderick Pee

## 2020-08-27 NOTE — Procedures (Signed)
Interventional Radiology Procedure Note  Procedure: Placement of percutaneous 20F  gastrostomy tube.  Complications: None  Recommendations: - NPO except for sips and chips remainder of today and overnight - Maintain G-tube to LWS until tomorrow morning  - May advance diet as tolerated and begin using tube tomorrow morning   Egon Dittus, MD    

## 2020-08-27 NOTE — Care Management Important Message (Signed)
Important Message  Patient Details IM Letter given to the Patient. Name: Jeff Jordan MRN: [id number] Date of Birth: 31-Jan-1955   Medicare Important Message Given:  Yes     Kerin Salen 08/27/2020, 10:47 AM

## 2020-08-27 NOTE — Progress Notes (Signed)
Inpatient Diabetes Program Recommendations  AACE/ADA: New Consensus Statement on Inpatient Glycemic Control (2015)  Target Ranges:  Prepandial:   less than 140 mg/dL      Peak postprandial:   less than 180 mg/dL (1-2 hours)      Critically ill patients:  140 - 180 mg/dL   Lab Results  Component Value Date   GLUCAP 102 (H) 08/27/2020    Review of Glycemic Control Results for Jeff Jordan, Jeff Jordan (MRN [id number]) as of 08/27/2020 11:37  Ref. Range 08/26/2020 07:55 08/26/2020 11:45 08/26/2020 18:41 08/26/2020 21:09 08/27/2020 01:01 08/27/2020 04:33 08/27/2020 04:35 08/27/2020 05:11 08/27/2020 07:22  Glucose-Capillary Latest Ref Range: 70 - 99 mg/dL 89 134 (H) 219 (H) 146 (H) 93 45 (L) 42 (LL) 153 (H) 102 (H)   Diabetes history: DM 2 Outpatient Diabetes medications: jardiance 25 mg Daily, Metformin 10000 mg bid, Actos 45 mg Daily Current orders for Inpatient glycemic control:  Semglee 30 units bid Novolog 3 units Q4 Tube Feed Coverage Novolog 0-9 units Q4 hours  Boost tid between meals D5 NS 100/hour Osmolite 55 ml/hour  Inpatient Diabetes Program Recommendations:    MD Note: pt not getting basal insulin bid. Glucose trends increase during the day due to boost supplement, however overnight in early morning hours the tube feed coverage results in hypoglycemia  -  Decrease Semglee to 30 units Daily -  Consider decreasing Tube feed Coverage to Novolog 2 units Q4 hours.  RN Note: nursing staff last night wondering about scheduled insulin. If pt is on tube feeds, glucose checks and correction need to be Q4 hours. Read MD note regarding cause for hypoglycemia.  Thanks,  Tama Headings RN, MSN, BC-ADM Inpatient Diabetes Coordinator Team Pager [phone] (8a-5p)

## 2020-08-27 NOTE — Progress Notes (Signed)
PROGRESS NOTE  Jeff Jordan [id number] DOB: 12-Apr-1955   PCP: Georganna Skeans, PA-C  Patient is from: Home.  DOA: 08/14/2020 LOS: 12  Chief complaints:  No chief complaint on file.    Brief Narrative / Interim history: 65 year old M with PMH of pancreatic adenocarcinoma, DM-2, HTN and FTT presenting for biliary obstruction due to pancreatic adenocarcinoma after he failed biliary stent placement by ERCP at GI office on 7/19.  He had percutaneous drain placed on 7/21 that was converted to internal and external biliary drain on 7/22.  He underwent cholangiogram on 7/28.  Biliary fluid with Candida glabrata and staph epidermis.  Started on Diflucan and ceftriaxone on 7/29 which were since changed to Eraxis and IV Unasyn per ID recommendation.  LFT and bilirubin improving.  IR to place G-tube for feeding.   Subjective: Seen and examined earlier this morning.  NG tube clogged up overnight.  Was hypoglycemic to 40s overnight.  Was not symptomatic.  Hypoglycemia resolved with dextrose.  No complaint this morning.  Objective: Vitals:   08/26/20 1351 08/26/20 2021 08/27/20 0440 08/27/20 0500  BP: (!) 168/58 (!) 149/64 (!) 143/60   Pulse: (!) 104 (!) 110 (!) 102   Resp: '16 14 14   '$ Temp: 98.2 F (36.8 C) 99.1 F (37.3 C) 97.7 F (36.5 C)   TempSrc: Oral Oral Oral   SpO2: 100% 100% 100%   Weight:    61.2 kg  Height:        Intake/Output Summary (Last 24 hours) at 08/27/2020 1307 Last data filed at 08/27/2020 0516 Gross per 24 hour  Intake 7171.34 ml  Output 1200 ml  Net 5971.34 ml   Filed Weights   08/24/20 0500 08/25/20 0413 08/27/20 0500  Weight: 65.6 kg 63.3 kg 61.2 kg    Examination:  GENERAL: Frail and chronically ill-appearing. HEENT: MMM.  Sclerae icteric. NECK: Supple.  No apparent JVD.  RESP:  No IWOB.  Fair aeration bilaterally. CVS:  RRR. Heart sounds normal.  ABD/GI/GU: BS+. Abd soft.  Some tenderness over RUQ.  PERC drain over RUQ. MSK/EXT:  Moves extremities.   Significant muscle mass and subcu fat loss. SKIN: no apparent skin lesion or wound NEURO: Awake and alert. Oriented fairly.  No apparent focal neuro deficit. PSYCH: Calm. Normal affect.   Procedures:  -Outpatient biliary stent placement on 7/19 unsuccessful -Percutaneous drain placement on 7/21 -Conversion of external biliary drain to internal and external biliary drain on 7/22 -IR cholangiogram on 7/28  Microbiology summarized: COVID-19 PCR nonreactive. Biliary culture with staph epidermis and Candida glabrata  Assessment & Plan: Biliary obstruction due to pancreatic adenocarcinoma Elevated liver enzymes/obstructive jaundice-total bili improving.  LFT relatively stable. -Outpatient biliary stent placement on 7/19 unsuccessful -Percutaneous drain placement on 7/21 -Conversion of external biliary drain to internal and external biliary drain on 7/22 -IR cholangiogram on 7/28 -IR and GI following. -Has referral to oncology office in [place] -Pain control -Hold home Crestor.  Biliary infection in the setting of the above: Biliary fluid culture with Candida glabrata and staph epidermis.  Staph epidermis likely contaminant -Appreciate ID recs  -Continue Eraxis and Unasyn for 5 days.  End date 8/4.   Hypernatremia likely hypovolemic. Na 148. -Change D5-NS to D5-1/2 NS pending G-tube placement. -FW at 250 cc every 4 hours once G-tube placed and ready to use  NIDDM-2 with hyperglycemia and hyperglycemia: On Jardiance, metformin and Actos at home. Recent Labs  Lab 08/27/20 0433 08/27/20 0435 08/27/20 0511 08/27/20 0722 08/27/20 1205  GLUCAP  45* 42* 153* 102* 101*  -Hold Levemir and tube feed coverage while tube feed is on hold. -Continue SSI-moderate -Follow hemoglobin A1c -Hold home Crestor with elevated LFT  Essential hypertension: BP acceptable. -Continue amlodipine 5 mg p.o. daily -Continue hydralazine IV as needed  Hypokalemia: Resolved.   HLD: Hold Crestor in the  setting of elevated LFT.   GERD: Protonix 40 mg IV daily   Thrush: Nystatin swish and swallow.  He is also on Eraxis now.   Diarrhea: Likely due to #1, and Carafate.  Resolved.  Generalized weakness/debility -PT/OT eval  Leukocytosis/bandemia: Likely due to biliary infection.  Improving. -Continue monitoring  Adult failure to thrive/moderate malnutrition-poor p.o. intake in the setting of adenocarcinoma. -Plan for G-tube on 8/1 Body mass index is 19.36 kg/m. Nutrition Problem: Moderate Malnutrition Etiology: chronic illness, cancer and cancer related treatments Signs/Symptoms: mild fat depletion, mild muscle depletion, moderate muscle depletion Interventions: Tube feeding   DVT prophylaxis:  SCDs Start: 08/14/20 1624  Code Status: Full code Family Communication: Patient and/or RN. Available if any question.  Level of care: Med-Surg Status is: Inpatient  Remains inpatient appropriate because:Ongoing diagnostic testing needed not appropriate for outpatient work up, IV treatments appropriate due to intensity of illness or inability to take PO, and Inpatient level of care appropriate due to severity of illness  Dispo: The patient is from: Home              Anticipated d/c is to: Home              Patient currently is not medically stable to d/c.   Difficult to place patient No       Consultants:  GI IR ID   Sch Meds:  Scheduled Meds:  amLODipine  5 mg Oral Daily   cholestyramine light  4 g Oral BID   feeding supplement  1 Container Oral TID BM   feeding supplement (PROSource TF)  45 mL Per Tube TID   ferrous sulfate  325 mg Oral Q breakfast   free water  250 mL Per Tube Q4H   insulin aspart  0-15 Units Subcutaneous TID WC   insulin aspart  0-5 Units Subcutaneous QHS   insulin aspart  3 Units Subcutaneous Q4H   insulin detemir  30 Units Subcutaneous BID   nystatin  5 mL Oral QID   pantoprazole  40 mg Intravenous Q1400   sodium chloride flush  5 mL  Intracatheter Q8H   sodium chloride flush  5 mL Intracatheter Q8H   Continuous Infusions:  ampicillin-sulbactam (UNASYN) IV 3 g (08/27/20 0627)   anidulafungin 100 mg (08/27/20 1046)   dextrose 5 % and 0.9% NaCl 100 mL/hr at 08/27/20 0446   feeding supplement (OSMOLITE 1.5 CAL) Stopped (08/26/20 2359)   vancomycin     PRN Meds:.doxylamine (Sleep), hydrALAZINE, morphine injection, ondansetron (ZOFRAN) IV, oxyCODONE  Antimicrobials: Anti-infectives (From admission, onward)    Start     Dose/Rate Route Frequency Ordered Stop   08/27/20 1500  vancomycin (VANCOCIN) IVPB 1000 mg/200 mL premix        1,000 mg 200 mL/hr over 60 Minutes Intravenous To Radiology 08/24/20 1644 08/28/20 1500   08/27/20 1000  anidulafungin (ERAXIS) 100 mg in sodium chloride 0.9 % 100 mL IVPB       See Hyperspace for full Linked Orders Report.   100 mg 78 mL/hr over 100 Minutes Intravenous Daily 08/26/20 1115 08/30/20 2359   08/26/20 1230  anidulafungin (ERAXIS) 200 mg in sodium chloride 0.9 %  200 mL IVPB       See Hyperspace for full Linked Orders Report.   200 mg 78 mL/hr over 200 Minutes Intravenous Once 08/26/20 1115 08/27/20 0008   08/26/20 1230  Ampicillin-Sulbactam (UNASYN) 3 g in sodium chloride 0.9 % 100 mL IVPB        3 g 200 mL/hr over 30 Minutes Intravenous Every 6 hours 08/26/20 1147 08/30/20 2359   08/24/20 1800  cefTRIAXone (ROCEPHIN) 2 g in sodium chloride 0.9 % 100 mL IVPB  Status:  Discontinued        2 g 200 mL/hr over 30 Minutes Intravenous Every 24 hours 08/24/20 1640 08/26/20 1147   08/24/20 1800  fluconazole (DIFLUCAN) IVPB 100 mg  Status:  Discontinued        100 mg 50 mL/hr over 60 Minutes Intravenous Every 24 hours 08/24/20 1640 08/26/20 1115   08/21/20 1100  metroNIDAZOLE (FLAGYL) tablet 500 mg  Status:  Discontinued        500 mg Oral Every 8 hours 08/21/20 1002 08/22/20 0744   08/19/20 1800  metroNIDAZOLE (FLAGYL) IVPB 500 mg  Status:  Discontinued        500 mg 100 mL/hr over  60 Minutes Intravenous Every 8 hours 08/19/20 1614 08/21/20 1002   08/17/20 0800  ciprofloxacin (CIPRO) tablet 500 mg        500 mg Oral 2 times daily 08/17/20 0411 08/21/20 2152   08/16/20 0600  cefOXitin (MEFOXIN) 2 g in sodium chloride 0.9 % 100 mL IVPB        2 g 200 mL/hr over 30 Minutes Intravenous To Radiology 08/14/20 1620 08/16/20 1704        I have personally reviewed the following labs and images: CBC: Recent Labs  Lab 08/23/20 0606 08/24/20 0634 08/25/20 0718 08/26/20 0720 08/27/20 1015  WBC 15.9* 16.1* 15.3* 12.5* 13.4*  NEUTROABS  --   --   --   --  10.5*  HGB 11.7* 10.7* 10.1* 10.2* 10.3*  HCT 34.0* 31.4* 30.0* 30.1* 30.9*  MCV 87.0 88.0 90.4 89.9 92.2  PLT 294 259 235 256 260   BMP &GFR Recent Labs  Lab 08/21/20 1948 08/22/20 0552 08/22/20 1718 08/23/20 0606 08/24/20 0634 08/25/20 0718 08/26/20 0720 08/27/20 1015  NA  --  148*  --  150* 150* 148* 147* 148*  K  --  3.0*  --  3.3* 3.6 3.5 3.1* 5.1  CL  --  110  --  114* 115* 116* 116* 117*  CO2  --  24  --  '26 24 24 23 25  '$ GLUCOSE  --  341*  --  226* 229* 236* 103* 124*  BUN  --  21  --  '19 14 15 15 14  '$ CREATININE  --  0.83  --  0.73 0.68 0.63 0.58* 0.38*  CALCIUM  --  9.9  --  9.7 9.3 9.1 9.0 8.9  MG  --  2.4  --   --  2.1 2.1 2.2 2.3  PHOS 2.9 2.2* 2.4*  --   --   --  2.8 3.2   Estimated Creatinine Clearance: 78.6 mL/min (A) (by C-G formula based on SCr of 0.38 mg/dL (L)). Liver & Pancreas: Recent Labs  Lab 08/23/20 0606 08/24/20 0634 08/25/20 0718 08/26/20 0720 08/27/20 1015  AST 109* 95* 95* 108* 164*  ALT 112* 95* 89* 91* 103*  ALKPHOS 149* 134* 134* 136* 140*  BILITOT 18.7* 16.0* 13.5* 11.5* 12.6*  PROT 7.1 6.5 6.3* 6.4*  6.3*  ALBUMIN 2.8* 2.5* 2.4* 2.4* 2.3*   No results for input(s): LIPASE, AMYLASE in the last 168 hours. No results for input(s): AMMONIA in the last 168 hours. Diabetic: No results for input(s): HGBA1C in the last 72 hours. Recent Labs  Lab 08/27/20 0433  08/27/20 0435 08/27/20 0511 08/27/20 0722 08/27/20 1205  GLUCAP 45* 42* 153* 102* 101*   Cardiac Enzymes: No results for input(s): CKTOTAL, CKMB, CKMBINDEX, TROPONINI in the last 168 hours. No results for input(s): PROBNP in the last 8760 hours. Coagulation Profile: Recent Labs  Lab 08/27/20 1015  INR 1.2   Thyroid Function Tests: No results for input(s): TSH, T4TOTAL, FREET4, T3FREE, THYROIDAB in the last 72 hours. Lipid Profile: No results for input(s): CHOL, HDL, LDLCALC, TRIG, CHOLHDL, LDLDIRECT in the last 72 hours. Anemia Panel: No results for input(s): VITAMINB12, FOLATE, FERRITIN, TIBC, IRON, RETICCTPCT in the last 72 hours. Urine analysis: No results found for: COLORURINE, APPEARANCEUR, LABSPEC, PHURINE, GLUCOSEU, HGBUR, BILIRUBINUR, KETONESUR, PROTEINUR, UROBILINOGEN, NITRITE, LEUKOCYTESUR Sepsis Labs: Invalid input(s): PROCALCITONIN, [place]  Microbiology: Recent Results (from the past 240 hour(s))  Body fluid culture w Gram Stain     Status: None (Preliminary result)   Collection Time: 08/22/20 11:12 AM   Specimen: BILE; Body Fluid  Result Value Ref Range Status   Specimen Description   Final    BILE Performed at [facility] [address], [place], [place] [address]    Special Requests   Final    Normal Performed at [facility], [place] [address] [place], [place] [address]    Gram Stain   Final    NO WBC SEEN MODERATE YEAST FEW GRAM POSITIVE COCCI    Culture   Final    MODERATE STAPHYLOCOCCUS EPIDERMIDIS MODERATE CANDIDA GLABRATA Sent to [facility] for further susceptibility testing. Performed at [facility], [place] [address], [place], [place] [address]    Report Status PENDING  Incomplete   Organism ID, Bacteria STAPHYLOCOCCUS EPIDERMIDIS  Final      Susceptibility   Staphylococcus epidermidis - MIC*    CIPROFLOXACIN >=8 RESISTANT Resistant     ERYTHROMYCIN >=8 RESISTANT Resistant     GENTAMICIN <=0.5  SENSITIVE Sensitive     OXACILLIN >=4 RESISTANT Resistant     TETRACYCLINE >=16 RESISTANT Resistant     VANCOMYCIN 1 SENSITIVE Sensitive     TRIMETH/SULFA <=10 SENSITIVE Sensitive     CLINDAMYCIN >=8 RESISTANT Resistant     RIFAMPIN <=0.5 SENSITIVE Sensitive     Inducible Clindamycin NEGATIVE Sensitive     * MODERATE STAPHYLOCOCCUS EPIDERMIDIS  Culture, blood (routine x 2)     Status: None (Preliminary result)   Collection Time: 08/26/20  2:11 PM   Specimen: BLOOD  Result Value Ref Range Status   Specimen Description   Final    BLOOD BLOOD RIGHT HAND Performed at [facility] [address], [place], [place] [address]    Special Requests   Final    BOTTLES DRAWN AEROBIC ONLY Blood Culture adequate volume Performed at [facility] [address] [place], [place] [address]    Culture   Final    NO GROWTH < 24 HOURS Performed at [facility] [address], [place], [place] [address]    Report Status PENDING  Incomplete    Radiology Studies: No results found.    Shaelyn Decarli T. [place]  If 7PM-7AM, please contact night-coverage [email or web address] 08/27/2020, 1:07 PM

## 2020-08-28 DIAGNOSIS — E440 Moderate protein-calorie malnutrition: Secondary | ICD-10-CM | POA: Diagnosis not present

## 2020-08-28 DIAGNOSIS — K831 Obstruction of bile duct: Secondary | ICD-10-CM | POA: Diagnosis not present

## 2020-08-28 DIAGNOSIS — C259 Malignant neoplasm of pancreas, unspecified: Secondary | ICD-10-CM | POA: Diagnosis not present

## 2020-08-28 DIAGNOSIS — R112 Nausea with vomiting, unspecified: Secondary | ICD-10-CM | POA: Diagnosis not present

## 2020-08-28 LAB — CBC
HCT: 32.3 % — ABNORMAL LOW (ref 39.0–52.0)
Hemoglobin: 10.8 g/dL — ABNORMAL LOW (ref 13.0–17.0)
MCH: 31.1 pg (ref 26.0–34.0)
MCHC: 33.4 g/dL (ref 30.0–36.0)
MCV: 93.1 fL (ref 80.0–100.0)
Platelets: 282 10*3/uL (ref 150–400)
RBC: 3.47 MIL/uL — ABNORMAL LOW (ref 4.22–5.81)
RDW: 25.3 % — ABNORMAL HIGH (ref 11.5–15.5)
WBC: 13.1 10*3/uL — ABNORMAL HIGH (ref 4.0–10.5)
nRBC: 0 % (ref 0.0–0.2)

## 2020-08-28 LAB — COMPREHENSIVE METABOLIC PANEL
ALT: 123 U/L — ABNORMAL HIGH (ref 0–44)
AST: 171 U/L — ABNORMAL HIGH (ref 15–41)
Albumin: 2.3 g/dL — ABNORMAL LOW (ref 3.5–5.0)
Alkaline Phosphatase: 156 U/L — ABNORMAL HIGH (ref 38–126)
Anion gap: 9 (ref 5–15)
BUN: 14 mg/dL (ref 8–23)
CO2: 25 mmol/L (ref 22–32)
Calcium: 9 mg/dL (ref 8.9–10.3)
Chloride: 119 mmol/L — ABNORMAL HIGH (ref 98–111)
Creatinine, Ser: 0.5 mg/dL — ABNORMAL LOW (ref 0.61–1.24)
GFR, Estimated: >60 mL/min (ref >60)
Glucose, Bld: 240 mg/dL — ABNORMAL HIGH (ref 70–99)
Potassium: 4 mmol/L (ref 3.5–5.1)
Sodium: 153 mmol/L — ABNORMAL HIGH (ref 135–145)
Total Bilirubin: 12.9 mg/dL — ABNORMAL HIGH (ref 0.3–1.2)
Total Protein: 6.5 g/dL (ref 6.5–8.1)

## 2020-08-28 LAB — HEMOGLOBIN A1C
Hgb A1c MFr Bld: 5.3 % (ref 4.8–5.6)
Mean Plasma Glucose: 105 mg/dL

## 2020-08-28 LAB — GLUCOSE, CAPILLARY
Glucose-Capillary: 123 mg/dL — ABNORMAL HIGH (ref 70–99)
Glucose-Capillary: 127 mg/dL — ABNORMAL HIGH (ref 70–99)
Glucose-Capillary: 194 mg/dL — ABNORMAL HIGH (ref 70–99)
Glucose-Capillary: 197 mg/dL — ABNORMAL HIGH (ref 70–99)
Glucose-Capillary: 212 mg/dL — ABNORMAL HIGH (ref 70–99)
Glucose-Capillary: 217 mg/dL — ABNORMAL HIGH (ref 70–99)

## 2020-08-28 LAB — MAGNESIUM: Magnesium: 2.2 mg/dL (ref 1.7–2.4)

## 2020-08-28 LAB — PHOSPHORUS: Phosphorus: 3.1 mg/dL (ref 2.5–4.6)

## 2020-08-28 MED ORDER — INSULIN DETEMIR 100 UNIT/ML ~~LOC~~ SOLN
15.0000 [IU] | Freq: Two times a day (BID) | SUBCUTANEOUS | Status: DC
  Administered 2020-08-28 – 2020-08-29 (×2): 15 [IU] via SUBCUTANEOUS
  Filled 2020-08-28 (×2): qty 0.15

## 2020-08-28 MED ORDER — INSULIN DETEMIR 100 UNIT/ML ~~LOC~~ SOLN
15.0000 [IU] | SUBCUTANEOUS | Status: AC
  Administered 2020-08-28: 15 [IU] via SUBCUTANEOUS
  Filled 2020-08-28: qty 0.15

## 2020-08-28 MED ORDER — AMOXICILLIN-POT CLAVULANATE 400-57 MG/5ML PO SUSR
875.0000 mg | Freq: Two times a day (BID) | ORAL | Status: AC
  Administered 2020-08-28 – 2020-08-30 (×6): 875 mg
  Filled 2020-08-28 (×6): qty 10.9

## 2020-08-28 NOTE — Progress Notes (Signed)
Nutrition Follow-up  DOCUMENTATION CODES:   Non-severe (moderate) malnutrition in context of chronic illness  INTERVENTION:   -Resume Osmolite 1.5 @ 55 ml/hr via G-tube -45 ml Prosource TF TID, each provides 40 kcals and 11g protein -Free water flushes of 250 ml every 4 hours (1500 ml) -Provides 2100 kcals, 115g protein and 2505 ml H2O  NUTRITION DIAGNOSIS:   Moderate Malnutrition related to chronic illness, cancer and cancer related treatments as evidenced by mild fat depletion, mild muscle depletion, moderate muscle depletion.  Ongoing.  GOAL:   Patient will meet greater than or equal to 90% of their needs  Progressing.  MONITOR:   TF tolerance, Diet advancement, PO intake, Labs, Weight trends   ASSESSMENT:   65 year old male with past medical history of hypertension and diabetes mellitus with recent diagnosis of pancreatic mass suspected to be pancreatic adenocarcinoma referred to Dr. Allison Quarry of gastroenterology for stent placement for obstruction and underwent ERCP on 7/19 with stent unable to be placed.  Patient was then directly admitted to the hospitalist service and seen by interventional radiology with plans for stent placement today.  In the interim, biopsy results positive for pancreatic adenocarcinoma.  7/19: s/p EGD 7/21: s/p biliary drain placement 7/27: NGT placed for TF initiation 7/31: NGT clogged 8/1: s/p G-tube placement  Patient now able to use G-tube this morning. Recommend resuming tube feeding at goal rate. Pt was tolerating prior to tube clogging this weekend.  Admission weight: 150 lbs. Current weight: 134 lbs.  Medications: Ferrous sulfate  Labs reviewed: CBGs: 101-217 Elevated Na   Diet Order:   Diet Order             Diet NPO time specified Except for: Sips with Meds  Diet effective midnight                   EDUCATION NEEDS:   Education needs have been addressed  Skin:  Skin Assessment: Reviewed RN Assessment  Last  BM:  8/2 -type 5  Height:   Ht Readings from Last 1 Encounters:  08/14/20 '5\' 10"'$  (1.778 m)    Weight:   Wt Readings from Last 1 Encounters:  08/27/20 61.2 kg   BMI:  Body mass index is 19.36 kg/m.  Estimated Nutritional Needs:   Kcal:  2000-2200  Protein:  105-120g  Fluid:  2.2L/day   Clayton Bibles, MS, RD, LDN Inpatient Clinical Dietitian Contact information available via Amion

## 2020-08-28 NOTE — Progress Notes (Signed)
Patient ID: Jeff Jordan, male   DOB: Apr 12, 1955, 65 y.o.   MRN: [id number]    Referring Physician(s): Jeff Jordan  Supervising Physician: Jeff Jordan  Patient Status:  [name]  Chief Complaint: Pancreatic Jordan with malignant obstructive jaundice, intermittent nausea /vomiting, malnutrition, poor oral intake, duodenal stenosis   Subjective: Patient doing fair.  Reports some discomfort in right upper quadrant and at G-tube site ;occasional nausea.   Allergies: Patient has no known allergies.  Medications: Prior to Admission medications   Medication Sig Start Date End Date Taking? Authorizing Provider  amLODipine (NORVASC) 5 MG tablet Take 5 mg by mouth daily. 06/19/20  Yes [provider]  aspirin 81 MG EC tablet Take 81 mg by mouth daily.   Yes [provider]  ferrous sulfate 325 (65 FE) MG tablet Take 325 mg by mouth daily with breakfast.   Yes [provider]  JARDIANCE 25 MG TABS tablet Take 25 mg by mouth daily. 07/23/20  Yes [provider]  loperamide (IMODIUM) 2 MG capsule Take by mouth as needed for diarrhea or loose stools.   Yes [provider]  metFORMIN (GLUCOPHAGE) 1000 MG tablet Take 1,000 mg by mouth 2 (two) times daily. 07/16/20  Yes [provider]  olmesartan (BENICAR) 40 MG tablet Take 40 mg by mouth daily. 06/25/20  Yes [provider]  omeprazole (PRILOSEC) 40 MG capsule Take 40 mg by mouth every morning. 03/01/20  Yes [provider]  ondansetron (ZOFRAN) 4 MG tablet Take 1 tablet (4 mg total) by mouth every 4 (four) hours as needed for nausea. 08/02/20  Yes Jeff Scrape A, NP  Oxycodone HCl 10 MG TABS Take 1 tablet (10 mg total) by mouth every 4 (four) hours as needed. Patient taking differently: Take 10 mg by mouth every 4 (four) hours as needed (pain). 08/02/20  Yes Jeff Scrape A, NP  pioglitazone (ACTOS) 45 MG tablet Take 45 mg by mouth daily. 06/25/20  Yes [provider]  rosuvastatin (CRESTOR) 5 MG tablet Take 5 mg by mouth once Jordan week. 06/25/20  Yes [provider]     Vital Signs: BP (!) 157/61 (BP Location: Right Arm)   Pulse (!) 102   Temp 98.6 F (37 C) (Oral)   Resp 17   Ht '5\' 10"'$  (1.778 m)   Wt 134 lb 14.7 oz (61.2 kg)   SpO2 100%   BMI 19.36 kg/m   Physical Exam awake, alert.  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 EXAM: PERC PLACEMENT GASTROSTOMY MEDICATIONS: Vancomycin 1 IV; Antibiotics were administered within 1 hour of the procedure. ANESTHESIA/SEDATION: The patient was continuously monitored during the procedure by the interventional radiology nurse under my direct supervision. Versed 2 mg IV; dilaudid 1 mg IV Moderate Sedation Time:  24 CONTRAST:  21m OMNIPAQUE IOHEXOL 300 MG/ML SOLN - administered into the gastric lumen. FLUOROSCOPY TIME:  Fluoroscopy Time: 4 minutes 12 seconds (58 mGy). COMPLICATIONS: None immediate. PROCEDURE: Informed written consent was obtained from the patient after Jordan thorough discussion of the procedural risks, benefits and alternatives. All questions were addressed. Maximal Sterile Barrier Technique was utilized including caps, mask, sterile gowns, sterile gloves, sterile drape, hand hygiene and skin antiseptic. Jordan timeout was performed prior to the initiation  of the procedure. The patient was placed on the procedure table in the supine position. Pre-procedure abdominal film confirmed visualization of the transverse colon. The patient was prepped and draped in usual sterile fashion. The stomach was insufflated with air via the indwelling nasogastric tube. Under fluoroscopy, Jordan puncture site was selected and  local analgesia achieved with 1% lidocaine infiltrated subcutaneously. Under fluoroscopic guidance, Jordan gastropexy needle was passed into the stomach and the T-bar suture was released. Entry into the stomach was confirmed with fluoroscopy, aspiration of air, and injection of contrast material. This was repeated with an additional gastropexy suture (for Jordan total of 2 fasteners). At the center of these gastropexy sutures, Jordan dermatotomy was performed. An 18 gauge needle was passed into the stomach at the site of this dermatotomy, and position within the gastric lumen again confirmed under fluoroscopy using aspiration of air and contrast injection. An Amplatz guidewire was passed through this needle and intraluminal placement within the stomach was confirmed by fluoroscopy. The needle was removed. Over the guidewire, the percutaneous tract was dilated using Jordan 10 mm non-compliant balloon. The balloon was deflated, then pushed into the gastric lumen followed in concert by the 20 Fr gastrostomy tube. The retention balloon of the percutaneous gastrostomy tube was inflated with 20 mL of sterile water. The tube was withdrawn until the retention balloon was at the edge of the gastric lumen. The external bumper was brought to the abdominal wall. Contrast was injected through the gastrostomy tube, confirming intraluminal positioning. Attempted was made it jejunal tube placement, however the pylorus was unable be engaged. The patient tolerated the procedure well without any immediate post-procedural complications. IMPRESSION: Technically successful placement of 20 Fr gastrostomy tube. Unsuccessful attempted jejunostomy tube placement due to inability to cannulate the pylorus. PLAN: Attempt at jejunal arm placement at Jordan later date could be pursued upon request as clinically indicated. Jeff Cancer, MD [facility] Electronically Signed   By: Jeff Cancer MD   On: 08/27/2020  16:52    Labs:  CBC: Recent Labs    08/25/20 0718 08/26/20 0720 08/27/20 1015 08/28/20 0501  WBC 15.3* 12.5* 13.4* 13.1*  HGB 10.1* 10.2* 10.3* 10.8*  HCT 30.0* 30.1* 30.9* 32.3*  PLT 235 256 260 282    COAGS: Recent Labs    08/14/20 1934 08/15/20 0452 08/16/20 0553 08/27/20 1015  INR 1.7* 1.5* 1.1 1.2    BMP: Recent Labs    08/25/20 0718 08/26/20 0720 08/27/20 1015 08/28/20 0501  NA 148* 147* 148* 153*  K 3.5 3.1* 5.1 4.0  CL 116* 116* 117* 119*  CO2 '24 23 25 25  '$ GLUCOSE 236* 103* 124* 240*  BUN '15 15 14 14  '$ CALCIUM 9.1 9.0 8.9 9.0  CREATININE 0.63 0.58* 0.38* 0.50*  GFRNONAA >60 >60 >60 >60    LIVER FUNCTION TESTS: Recent Labs    08/25/20 0718 08/26/20 0720 08/27/20 1015 08/28/20 0501  BILITOT 13.5* 11.5* 12.6* 12.9*  AST 95* 108* 164* 171*  ALT 89* 91* 103* 123*  ALKPHOS 134* 136* 140* 156*  PROT 6.3* 6.4* 6.3* 6.5  ALBUMIN 2.4* 2.4* 2.3* 2.3*    Assessment and Plan: Patient with history of pancreatic Jordan with malignant biliary obstruction, status post external biliary drain placement on 7/21 and conversion to internal and external biliary drain on 7/22.  Patient unable to undergo ERCP due to duodenal stenosis; now with persistent minimal oral intake; status post percutaneous gastrostomy placement yesterday-unsuccessful attempt at jejunostomy tube placement  due to inability to cannulate the pylorus ; OK TO USE TUBE; afebrile, drain output 275 cc today, drain flushes without difficulty, recent follow-up cholangiogram revealed appropriate positioning of catheter; WBC 13.1, hemoglobin stable, creatinine 1.5, total bilirubin 12.9(12.6); continue current treatment, lab monitoring, biliary drain irrigation; once significant drop in bilirubin noted, could consider capping trial; attempt at jejunal arm placement at Jordan later date could be pursued upon request as clinically indicated; if clinical condition worsens consider f/u CT  Electronically Signed: D.  Rowe Robert, PA-C 08/28/2020, 2:31 PM   I spent Jordan total of 15 Minutes at the the patient's bedside AND on the patient's hospital floor or unit, greater than 50% of which was counseling/coordinating care for biliary drain and gastrostomy tube

## 2020-08-28 NOTE — Progress Notes (Signed)
Occupational Therapy Treatment Patient Details Name: Artist Kulish MRN: [id number] DOB: [birth year] Today's Date: 08/28/2020    History of present illness Pt admitted with bilary obstruction 2*pancreatic adenocarcinoma and now s/p bilary drain placement.  Pt with hx of DM.   OT comments  Patient reporting wanting to drink water, stating his stomach is aching "I haven't had anything to eat since Sunday." Conveyed patient's concerns to RN with tube feedings to be restarted today. Patient overall supervision level for self care tasks including oral care, toilet transfer and peri anal care without loss of balance however note patient is more tremulous compared to previous session. Needing verbal cues for safe body mechanics while using walker. Acute OT to follow.    Follow Up Recommendations  Home health OT    Equipment Recommendations  None recommended by OT       Precautions / Restrictions Precautions Precautions: Fall;Other (comment) Precaution Comments: bilary drain on R, G tube, abdominal binder       Mobility Bed Mobility Overal bed mobility: Needs Assistance Bed Mobility: Supine to Sit     Supine to sit: Supervision     General bed mobility comments: S for safety/ drain management    Transfers Overall transfer level: Needs assistance Equipment used: Rolling walker (2 wheeled) Transfers: Sit to/from Stand Sit to Stand: Supervision         General transfer comment: needed cues to push from bed vs pulling on walker    Balance Overall balance assessment: Needs assistance Sitting-balance support: Feet supported Sitting balance-Leahy Scale: Good     Standing balance support: No upper extremity supported Standing balance-Leahy Scale: Fair Standing balance comment: standing at sink                           ADL either performed or assessed with clinical judgement   ADL Overall ADL's : Needs assistance/impaired     Grooming: Oral care;Wash/dry  face;Wash/dry hands;Supervision/safety;Standing Grooming Details (indicate cue type and reason): able to stand without upper extremity support on sink                 Toilet Transfer: Supervision/safety;Ambulation;BSC;RW Toilet Transfer Details (indicate cue type and reason): no physical assistance to power up to standing from bedside commode Toileting- Clothing Manipulation and Hygiene: Supervision/safety;Sit to/from stand Toileting - Clothing Manipulation Details (indicate cue type and reason): to perform perianal care after small bowel movement     Functional mobility during ADLs: Cueing for safety;Cueing for sequencing;Rolling walker;Supervision/safety General ADL Comments: note patient more tremulous this session during mobility, states he feels weak "I haven't had anything since sunday"      Cognition Arousal/Alertness: Awake/alert Behavior During Therapy: WFL for tasks assessed/performed;Flat affect Overall Cognitive Status: Within Functional Limits for tasks assessed                                                     Pertinent Vitals/ Pain       Pain Assessment: Faces Faces Pain Scale: Hurts little more Pain Location: abdomen Pain Descriptors / Indicators: Aching Pain Intervention(s): Other (comment) (RN aware)         Frequency  Min 2X/week        Progress Toward Goals  OT Goals(current goals can now be found in the care plan section)  Progress  towards OT goals: Progressing toward goals  Acute Rehab OT Goals Patient Stated Goal: Regain IND OT Goal Formulation: With patient Time For Goal Achievement: 09/03/20 Potential to Achieve Goals: Good ADL Goals Pt Will Transfer to Toilet: with modified independence;ambulating Pt Will Perform Toileting - Clothing Manipulation and hygiene: with modified independence;sit to/from stand Additional ADL Goal #1: Patient will stand at sink to perform grooming task as evidence of improving activity  tolerance  Plan Discharge plan remains appropriate       AM-PAC OT "6 Clicks" Daily Activity     Outcome Measure   Help from another person eating meals?: None Help from another person taking care of personal grooming?: A Little Help from another person toileting, which includes using toliet, bedpan, or urinal?: A Little Help from another person bathing (including washing, rinsing, drying)?: A Little Help from another person to put on and taking off regular upper body clothing?: A Little Help from another person to put on and taking off regular lower body clothing?: A Little 6 Click Score: 19    End of Session Equipment Utilized During Treatment: Rolling walker  OT Visit Diagnosis: Muscle weakness (generalized) (M62.81);Unsteadiness on feet (R26.81)   Activity Tolerance Patient tolerated treatment well   Patient Left in chair;with call bell/phone within reach;with chair alarm set   Nurse Communication Mobility status        Time: [id number] OT Time Calculation (min): 23 min  Charges: OT General Charges $OT Visit: 1 Visit OT Treatments $Self Care/Home Management : 23-37 mins  Delbert Phenix OT OT pager: [phone]   Rosemary Holms 08/28/2020, 11:36 AM

## 2020-08-28 NOTE — Progress Notes (Signed)
PROGRESS NOTE  Jeff Jordan [id number] DOB: Nov 07, 1955   PCP: Georganna Skeans, PA-C  Patient is from: Home.  DOA: 08/14/2020 LOS: 13  Chief complaints:  No chief complaint on file.    Brief Narrative / Interim history: 65 year old M with PMH of pancreatic adenocarcinoma, DM-2, HTN and FTT presenting for biliary obstruction due to pancreatic adenocarcinoma after he failed biliary stent placement by ERCP at GI office on 7/19.  He had percutaneous drain placed on 7/21 that was converted to internal and external biliary drain on 7/22.  He underwent cholangiogram on 7/28.  Biliary fluid with Candida glabrata and staph epidermis.  Started on Diflucan and ceftriaxone on 7/29 which were since changed to Eraxis and IV Unasyn per ID recommendation.  LFT and bilirubin improving.  G-tube placed by IR on 8/1.  Subjective: Seen and examined earlier this morning.  No major events overnight of this morning.  Reports "some" pain over his right chest wall laterally.  Denies nausea or vomiting.  Objective: Vitals:   08/27/20 1833 08/27/20 1835 08/27/20 2046 08/28/20 0432  BP: (!) 155/65  (!) 157/73 (!) 157/61  Pulse: 94 96 (!) 101 (!) 102  Resp: '15  18 17  '$ Temp: 98.1 F (36.7 C)  98.8 F (37.1 C) 98.6 F (37 C)  TempSrc: Oral  Oral Oral  SpO2:  100% 99% 100%  Weight:      Height:        Intake/Output Summary (Last 24 hours) at 08/28/2020 1202 Last data filed at 08/28/2020 0443 Gross per 24 hour  Intake 1546.67 ml  Output 1540 ml  Net 6.67 ml   Filed Weights   08/24/20 0500 08/25/20 0413 08/27/20 0500  Weight: 65.6 kg 63.3 kg 61.2 kg    Examination:  GENERAL: Frail and chronically ill-appearing.  Nontoxic.  Sitting on bedside chair. HEENT: MMM.  Sclerae icteric. NECK: Supple.  No apparent JVD.  RESP: On RA.  No IWOB.  Fair aeration bilaterally. CVS:  RRR. Heart sounds normal.  ABD/GI/GU: BS+. Abd soft, NTND.  Biliary drain in place.  G-tube. MSK/EXT:  Moves extremities.  Muscle mass  and subcu fat loss. SKIN: no apparent skin lesion or wound NEURO: Awake and alert. Oriented appropriately.  No apparent focal neuro deficit. PSYCH: Calm. Normal affect.   Procedures:  -7/19-outpatient biliary stent placement unsuccessful -7/21-percutaneous drain placement -7/22-conversion of external biliary drain to internal and external biliary drain  -7/28-IR cholangiogram on 7/28 -8/1-G-tube placed by IR   Microbiology summarized: COVID-19 PCR nonreactive. Biliary culture with staph epidermis and Candida glabrata  Assessment & Plan: Biliary obstruction due to pancreatic adenocarcinoma Elevated liver enzymes/obstructive jaundice-LFT and bilirubin slightly up today. -Outpatient biliary stent placement on 7/19 unsuccessful -PERC biliary drain on 7/21, conversion to internal and external biliary drain on 7/22, IR cholangiogram on 7/28 -G-tube placement on 8/1 -Has referral to oncology office in [place] -Pain control -Hold home Crestor. -Antibiotics as below.  Biliary infection/cholangitis in the setting of the above: Biliary fluid culture with Candida glabrata and staph epidermis.  Staph epidermis likely contaminant.  -Appreciate ID recs  -Continue Eraxis for 5 days, end date 8/4.  -IV Unasyn>> p.o. Augmentin for a total of 5 days, end date 8/4 -Follow sensitivities on Candida  Hypernatremia likely hypovolemic. Na 151, likely due to IV fluid while free water was on hold -Discontinue IVF -Resume FW at 250 cc every 4 hours  -Recheck in the morning  Controlled NIDDM-2 with hyperglycemia and hyperglycemia: On Jardiance, metformin and Actos  at home.  A1c 5.3%. Recent Labs  Lab 08/27/20 1706 08/27/20 2043 08/27/20 2358 08/28/20 0434 08/28/20 0732  GLUCAP 115* 154* 194* 197* 217*  -Resume Levemir after 15 units twice daily until TF at goal -Continue SSI-moderate -Continue NovoLog 3 units every 4 hours for TF coverage -Hold home Crestor with elevated LFT  Essential  hypertension: BP acceptable. -Continue amlodipine 5 mg p.o. daily -Continue hydralazine IV as needed  Hypokalemia: Resolved.   HLD: Hold Crestor in the setting of elevated LFT.   GERD: Protonix 40 mg IV daily   Thrush: Nystatin swish and swallow.  He is also on Eraxis now.   Diarrhea: Likely due to #1, and Carafate.  Resolved.  Generalized weakness/debility -PT/OT eval  Leukocytosis/bandemia: Likely due to biliary infection.  Improving. -Continue monitoring  Adult failure to thrive/moderate malnutrition-poor p.o. intake in the setting of adenocarcinoma. Body mass index is 19.36 kg/m. Nutrition Problem: Moderate Malnutrition Etiology: chronic illness, cancer and cancer related treatments Signs/Symptoms: mild fat depletion, mild muscle depletion, moderate muscle depletion Interventions: Tube feeding   DVT prophylaxis:  SCDs Start: 08/14/20 1624  Code Status: Full code Family Communication: Patient and/or RN. Available if any question.  Level of care: Med-Surg Status is: Inpatient  Remains inpatient appropriate because:Ongoing diagnostic testing needed not appropriate for outpatient work up, IV treatments appropriate due to intensity of illness or inability to take PO, and Inpatient level of care appropriate due to severity of illness  Dispo: The patient is from: Home              Anticipated d/c is to: Home              Patient currently is not medically stable to d/c.   Difficult to place patient No       Consultants:  GI IR ID   Sch Meds:  Scheduled Meds:  amLODipine  5 mg Oral Daily   amoxicillin-clavulanate  875 mg Per Tube Q12H   cholestyramine light  4 g Oral BID   feeding supplement  1 Container Oral TID BM   feeding supplement (PROSource TF)  45 mL Per Tube TID   ferrous sulfate  325 mg Oral Q breakfast   free water  250 mL Per Tube Q4H   insulin aspart  0-15 Units Subcutaneous TID WC   insulin aspart  0-5 Units Subcutaneous QHS   insulin aspart   3 Units Subcutaneous Q4H   insulin detemir  15 Units Subcutaneous NOW   insulin detemir  15 Units Subcutaneous BID   nystatin  5 mL Oral QID   pantoprazole  40 mg Intravenous Q1400   sodium chloride flush  5 mL Intracatheter Q8H   sodium chloride flush  5 mL Intracatheter Q8H   Continuous Infusions:  anidulafungin 100 mg (08/28/20 1157)   feeding supplement (OSMOLITE 1.5 CAL) Stopped (08/26/20 2359)   PRN Meds:.doxylamine (Sleep), hydrALAZINE, morphine injection, ondansetron (ZOFRAN) IV, oxyCODONE  Antimicrobials: Anti-infectives (From admission, onward)    Start     Dose/Rate Route Frequency Ordered Stop   08/28/20 1300  amoxicillin-clavulanate (AUGMENTIN) 400-57 MG/5ML suspension 875 mg        875 mg Per Tube Every 12 hours 08/28/20 1152 08/31/20 0959   08/27/20 1500  vancomycin (VANCOCIN) IVPB 1000 mg/200 mL premix        1,000 mg 200 mL/hr over 60 Minutes Intravenous To Radiology 08/24/20 1644 08/27/20 1644   08/27/20 1000  anidulafungin (ERAXIS) 100 mg in sodium chloride 0.9 % 100 mL  IVPB       See Hyperspace for full Linked Orders Report.   100 mg 78 mL/hr over 100 Minutes Intravenous Daily 08/26/20 1115 08/30/20 2359   08/26/20 1230  anidulafungin (ERAXIS) 200 mg in sodium chloride 0.9 % 200 mL IVPB       See Hyperspace for full Linked Orders Report.   200 mg 78 mL/hr over 200 Minutes Intravenous Once 08/26/20 1115 08/27/20 0008   08/26/20 1230  Ampicillin-Sulbactam (UNASYN) 3 g in sodium chloride 0.9 % 100 mL IVPB  Status:  Discontinued        3 g 200 mL/hr over 30 Minutes Intravenous Every 6 hours 08/26/20 1147 08/28/20 1152   08/24/20 1800  cefTRIAXone (ROCEPHIN) 2 g in sodium chloride 0.9 % 100 mL IVPB  Status:  Discontinued        2 g 200 mL/hr over 30 Minutes Intravenous Every 24 hours 08/24/20 1640 08/26/20 1147   08/24/20 1800  fluconazole (DIFLUCAN) IVPB 100 mg  Status:  Discontinued        100 mg 50 mL/hr over 60 Minutes Intravenous Every 24 hours 08/24/20 1640  08/26/20 1115   08/21/20 1100  metroNIDAZOLE (FLAGYL) tablet 500 mg  Status:  Discontinued        500 mg Oral Every 8 hours 08/21/20 1002 08/22/20 0744   08/19/20 1800  metroNIDAZOLE (FLAGYL) IVPB 500 mg  Status:  Discontinued        500 mg 100 mL/hr over 60 Minutes Intravenous Every 8 hours 08/19/20 1614 08/21/20 1002   08/17/20 0800  ciprofloxacin (CIPRO) tablet 500 mg        500 mg Oral 2 times daily 08/17/20 0411 08/21/20 2152   08/16/20 0600  cefOXitin (MEFOXIN) 2 g in sodium chloride 0.9 % 100 mL IVPB        2 g 200 mL/hr over 30 Minutes Intravenous To Radiology 08/14/20 1620 08/16/20 1704        I have personally reviewed the following labs and images: CBC: Recent Labs  Lab 08/24/20 0634 08/25/20 0718 08/26/20 0720 08/27/20 1015 08/28/20 0501  WBC 16.1* 15.3* 12.5* 13.4* 13.1*  NEUTROABS  --   --   --  10.5*  --   HGB 10.7* 10.1* 10.2* 10.3* 10.8*  HCT 31.4* 30.0* 30.1* 30.9* 32.3*  MCV 88.0 90.4 89.9 92.2 93.1  PLT 259 235 256 260 282   BMP &GFR Recent Labs  Lab 08/22/20 0552 08/22/20 1718 08/23/20 0606 08/24/20 0634 08/25/20 0718 08/26/20 0720 08/27/20 1015 08/28/20 0501  NA 148*  --    < > 150* 148* 147* 148* 153*  K 3.0*  --    < > 3.6 3.5 3.1* 5.1 4.0  CL 110  --    < > 115* 116* 116* 117* 119*  CO2 24  --    < > '24 24 23 25 25  '$ GLUCOSE 341*  --    < > 229* 236* 103* 124* 240*  BUN 21  --    < > '14 15 15 14 14  '$ CREATININE 0.83  --    < > 0.68 0.63 0.58* 0.38* 0.50*  CALCIUM 9.9  --    < > 9.3 9.1 9.0 8.9 9.0  MG 2.4  --   --  2.1 2.1 2.2 2.3 2.2  PHOS 2.2* 2.4*  --   --   --  2.8 3.2 3.1   < > = values in this interval not displayed.   Estimated Creatinine Clearance:  78.6 mL/min (A) (by C-G formula based on SCr of 0.5 mg/dL (L)). Liver & Pancreas: Recent Labs  Lab 08/24/20 0634 08/25/20 0718 08/26/20 0720 08/27/20 1015 08/28/20 0501  AST 95* 95* 108* 164* 171*  ALT 95* 89* 91* 103* 123*  ALKPHOS 134* 134* 136* 140* 156*  BILITOT 16.0*  13.5* 11.5* 12.6* 12.9*  PROT 6.5 6.3* 6.4* 6.3* 6.5  ALBUMIN 2.5* 2.4* 2.4* 2.3* 2.3*   No results for input(s): LIPASE, AMYLASE in the last 168 hours. No results for input(s): AMMONIA in the last 168 hours. Diabetic: Recent Labs    08/26/20 0720  HGBA1C 5.3   Recent Labs  Lab 08/27/20 1706 08/27/20 2043 08/27/20 2358 08/28/20 0434 08/28/20 0732  GLUCAP 115* 154* 194* 197* 217*   Cardiac Enzymes: No results for input(s): CKTOTAL, CKMB, CKMBINDEX, TROPONINI in the last 168 hours. No results for input(s): PROBNP in the last 8760 hours. Coagulation Profile: Recent Labs  Lab 08/27/20 1015  INR 1.2   Thyroid Function Tests: No results for input(s): TSH, T4TOTAL, FREET4, T3FREE, THYROIDAB in the last 72 hours. Lipid Profile: No results for input(s): CHOL, HDL, LDLCALC, TRIG, CHOLHDL, LDLDIRECT in the last 72 hours. Anemia Panel: No results for input(s): VITAMINB12, FOLATE, FERRITIN, TIBC, IRON, RETICCTPCT in the last 72 hours. Urine analysis: No results found for: COLORURINE, APPEARANCEUR, LABSPEC, PHURINE, GLUCOSEU, HGBUR, BILIRUBINUR, KETONESUR, PROTEINUR, UROBILINOGEN, NITRITE, LEUKOCYTESUR Sepsis Labs: Invalid input(s): PROCALCITONIN, [place]  Microbiology: Recent Results (from the past 240 hour(s))  Body fluid culture w Gram Stain     Status: None (Preliminary result)   Collection Time: 08/22/20 11:12 AM   Specimen: BILE; Body Fluid  Result Value Ref Range Status   Specimen Description   Final    BILE Performed at [facility] [address], [place], [place] [address]    Special Requests   Final    Normal Performed at [facility], [place] [address], [place], [place] [address]    Gram Stain   Final    NO WBC SEEN MODERATE YEAST FEW GRAM POSITIVE COCCI    Culture   Final    MODERATE STAPHYLOCOCCUS EPIDERMIDIS MODERATE CANDIDA GLABRATA Sent to [place] for further susceptibility testing. Performed at [facility], [place] [address] [place], [place] [address]    Report Status PENDING  Incomplete   Organism ID, Bacteria STAPHYLOCOCCUS EPIDERMIDIS  Final      Susceptibility   Staphylococcus epidermidis - MIC*    CIPROFLOXACIN >=8 RESISTANT Resistant     ERYTHROMYCIN >=8 RESISTANT Resistant     GENTAMICIN <=0.5 SENSITIVE Sensitive     OXACILLIN >=4 RESISTANT Resistant     TETRACYCLINE >=16 RESISTANT Resistant     VANCOMYCIN 1 SENSITIVE Sensitive     TRIMETH/SULFA <=10 SENSITIVE Sensitive     CLINDAMYCIN >=8 RESISTANT Resistant     RIFAMPIN <=0.5 SENSITIVE Sensitive     Inducible Clindamycin NEGATIVE Sensitive     * MODERATE STAPHYLOCOCCUS EPIDERMIDIS  Culture, blood (routine x 2)     Status: None (Preliminary result)   Collection Time: 08/26/20  2:11 PM   Specimen: BLOOD  Result Value Ref Range Status   Specimen Description   Final    BLOOD BLOOD RIGHT HAND Performed at [facility] [address] [place], [place] [address]    Special Requests   Final    BOTTLES DRAWN AEROBIC ONLY Blood Culture adequate volume Performed at [facility] [address] [place], [place]  [address]    Culture   Final    NO GROWTH 2 DAYS Performed at [facility], [place] [address], [place], [place] [address]    Report Status PENDING  Incomplete    Radiology Studies: IR GASTROSTOMY TUBE MOD SED  Result Date: 08/27/2020 INDICATION: 65 year old male with metastatic pancreatic cancer with possible duodenal obstruction. EXAM: PERC PLACEMENT GASTROSTOMY MEDICATIONS: Vancomycin 1 IV; Antibiotics were administered within 1 hour of the procedure. ANESTHESIA/SEDATION: The patient was continuously monitored during the procedure by the interventional radiology nurse under my direct supervision. Versed 2 mg IV; dilaudid 1 mg IV Moderate Sedation Time:  24 CONTRAST:  27m OMNIPAQUE IOHEXOL 300 MG/ML SOLN - administered into the gastric lumen. FLUOROSCOPY TIME:  Fluoroscopy  Time: 4 minutes 12 seconds (58 mGy). COMPLICATIONS: None immediate. PROCEDURE: Informed written consent was obtained from the patient after a thorough discussion of the procedural risks, benefits and alternatives. All questions were addressed. Maximal Sterile Barrier Technique was utilized including caps, mask, sterile gowns, sterile gloves, sterile drape, hand hygiene and skin antiseptic. A timeout was performed prior to the initiation of the procedure. The patient was placed on the procedure table in the supine position. Pre-procedure abdominal film confirmed visualization of the transverse colon. The patient was prepped and draped in usual sterile fashion. The stomach was insufflated with air via the indwelling nasogastric tube. Under fluoroscopy, a puncture site was selected and local analgesia achieved with 1% lidocaine infiltrated subcutaneously. Under fluoroscopic guidance, a gastropexy needle was passed into the stomach and the T-bar suture was released. Entry into the stomach was confirmed with fluoroscopy, aspiration of air, and injection of contrast material. This was repeated with an additional gastropexy suture (for a total of 2 fasteners). At the center of these gastropexy sutures, a dermatotomy was performed. An 18 gauge needle was passed into the stomach at the site of this dermatotomy, and position within the gastric lumen again confirmed under fluoroscopy using aspiration of air and contrast injection. An Amplatz guidewire was passed through this needle and intraluminal placement within the stomach was confirmed by fluoroscopy. The needle was removed. Over the guidewire, the percutaneous tract was dilated using a 10 mm non-compliant balloon. The balloon was deflated, then pushed into the gastric lumen followed in concert by the 20 Fr gastrostomy tube. The retention balloon of the percutaneous gastrostomy tube was inflated with 20 mL of sterile water. The tube was withdrawn until the retention  balloon was at the edge of the gastric lumen. The external bumper was brought to the abdominal wall. Contrast was injected through the gastrostomy tube, confirming intraluminal positioning. Attempted was made it jejunal tube placement, however the pylorus was unable be engaged. The patient tolerated the procedure well without any immediate post-procedural complications. IMPRESSION: Technically successful placement of 20 Fr gastrostomy tube. Unsuccessful attempted jejunostomy tube placement due to inability to cannulate the pylorus. PLAN: Attempt at jejunal arm placement at a later date could be pursued upon request as clinically indicated. DRuthann Cancer MD [facility] Electronically Signed   By: DRuthann CancerMD   On: 08/27/2020 16:52      Daylen Hack T. GLake [place] If 7PM-7AM, please contact night-coverage [email or web address] 08/28/2020, 12:02 PM

## 2020-08-28 NOTE — Progress Notes (Signed)
Physical Therapy Treatment Patient Details Name: Jeff Jordan MRN: [id number] DOB: 1955-12-11 Today's Date: 08/28/2020    History of Present Illness 65 year old M with PMH of pancreatic adenocarcinoma, DM-2, HTN and FTT presenting for biliary obstruction due to pancreatic adenocarcinoma after he failed biliary stent placement by ERCP at GI office on 7/19.  He had percutaneous drain placed on 7/21 that was converted to internal and external biliary drain on 7/22.  He underwent cholangiogram on 7/28.  Biliary fluid with Candida glabrata and staph epidermis.  Started on Diflucan and ceftriaxone on 7/29 which were since changed to Eraxis and IV Unasyn per ID recommendation.  LFT and bilirubin improving.  G-tube placed by IR on 8/1.    PT Comments    Pt in bed with HOB elevated > 40 degrees as his tube feeding was running.  Assisted OOB to amb.  General Gait Details: slightly decreased amb distance due to ABD discomfort and weakness.  Slight forward flex posture.  25% VC's on proper walker to self distance and safety with turns. Pt plans to return home with help from a daughter.   Follow Up Recommendations  Home health PT     Equipment Recommendations  Rolling walker with 5" wheels (delivered and in room)    Recommendations for Other Services       Precautions / Restrictions Precautions Precautions: Fall Precaution Comments: bilary drain on R, G tube, abdominal binder    Mobility  Bed Mobility Overal bed mobility: Needs Assistance Bed Mobility: Supine to Sit;Sit to Supine     Supine to sit: Supervision Sit to supine: Min guard;Min assist   General bed mobility comments: Supervision for safety/ drain management    Transfers Overall transfer level: Needs assistance Equipment used: Rolling walker (2 wheeled) Transfers: Sit to/from [facility] Sit to Stand: Supervision Stand pivot transfers: Supervision;Min guard       General transfer comment: needed cues to  push from bed vs pulling on walker and safety with turns using walker  Ambulation/Gait Ambulation/Gait assistance: Supervision;Min guard Gait Distance (Feet): 122 Feet Assistive device: Rolling walker (2 wheeled) Gait Pattern/deviations: Step-through pattern;Decreased stride length;Trunk flexed;Shuffle Gait velocity: decreased   General Gait Details: slightly decreased amb distance due to ABD discomfort and weakness.  Slight forward flex posture.  25% VC's on proper walker to self distance and safetyt with turns.   Stairs             Wheelchair Mobility    Modified Rankin (Stroke Patients Only)       Balance Overall balance assessment: Needs assistance Sitting-balance support: Feet supported Sitting balance-Leahy Scale: Good     Standing balance support: No upper extremity supported Standing balance-Leahy Scale: Fair Standing balance comment: standing at sink                            Cognition Arousal/Alertness: Awake/alert Behavior During Therapy: WFL for tasks assessed/performed;Flat affect Overall Cognitive Status: Within Functional Limits for tasks assessed                                 General Comments: AxO x 3 pleasant      Exercises      General Comments        Pertinent Vitals/Pain Pain Assessment: Faces Faces Pain Scale: Hurts little more Pain Location: abdomen Pain Descriptors / Indicators: Aching;Discomfort Pain Intervention(s): Monitored during session  Home Living                      Prior Function            PT Goals (current goals can now be found in the care plan section) Acute Rehab PT Goals Patient Stated Goal: Regain IND Progress towards PT goals: Progressing toward goals    Frequency    Min 3X/week      PT Plan Current plan remains appropriate    Co-evaluation              AM-PAC PT "6 Clicks" Mobility   Outcome Measure  Help needed turning from your back to your  side while in a flat bed without using bedrails?: A Little Help needed moving from lying on your back to sitting on the side of a flat bed without using bedrails?: A Little Help needed moving to and from a bed to a chair (including a wheelchair)?: A Little Help needed standing up from a chair using your arms (e.g., wheelchair or bedside chair)?: A Little Help needed to walk in hospital room?: A Little Help needed climbing 3-5 steps with a railing? : A Little 6 Click Score: 18    End of Session Equipment Utilized During Treatment: Gait belt Activity Tolerance: Patient tolerated treatment well Patient left: in bed;with call bell/phone within reach;with bed alarm set Nurse Communication: Mobility status PT Visit Diagnosis: Difficulty in walking, not elsewhere classified (R26.2)     Time: [id number] PT Time Calculation (min) (ACUTE ONLY): 22 min  Charges:  $Gait Training: 8-22 mins                     Rica Koyanagi  PTA Acute  Rehabilitation Services Pager      [phone] Office      [phone]

## 2020-08-29 ENCOUNTER — Inpatient Hospital Stay (HOSPITAL_COMMUNITY): Payer: Medicare HMO

## 2020-08-29 ENCOUNTER — Other Ambulatory Visit: Payer: Self-pay

## 2020-08-29 DIAGNOSIS — E440 Moderate protein-calorie malnutrition: Secondary | ICD-10-CM | POA: Diagnosis not present

## 2020-08-29 DIAGNOSIS — K831 Obstruction of bile duct: Secondary | ICD-10-CM | POA: Diagnosis not present

## 2020-08-29 DIAGNOSIS — R197 Diarrhea, unspecified: Secondary | ICD-10-CM

## 2020-08-29 DIAGNOSIS — R112 Nausea with vomiting, unspecified: Secondary | ICD-10-CM | POA: Diagnosis not present

## 2020-08-29 DIAGNOSIS — C259 Malignant neoplasm of pancreas, unspecified: Secondary | ICD-10-CM | POA: Diagnosis not present

## 2020-08-29 DIAGNOSIS — R079 Chest pain, unspecified: Secondary | ICD-10-CM

## 2020-08-29 DIAGNOSIS — K909 Intestinal malabsorption, unspecified: Secondary | ICD-10-CM

## 2020-08-29 LAB — COMPREHENSIVE METABOLIC PANEL
ALT: 136 U/L — ABNORMAL HIGH (ref 0–44)
AST: 169 U/L — ABNORMAL HIGH (ref 15–41)
Albumin: 2.6 g/dL — ABNORMAL LOW (ref 3.5–5.0)
Alkaline Phosphatase: 165 U/L — ABNORMAL HIGH (ref 38–126)
Anion gap: 13 (ref 5–15)
BUN: 14 mg/dL (ref 8–23)
CO2: 17 mmol/L — ABNORMAL LOW (ref 22–32)
Calcium: 9.2 mg/dL (ref 8.9–10.3)
Chloride: 122 mmol/L — ABNORMAL HIGH (ref 98–111)
Creatinine, Ser: 0.62 mg/dL (ref 0.61–1.24)
GFR, Estimated: >60 mL/min (ref >60)
Glucose, Bld: 220 mg/dL — ABNORMAL HIGH (ref 70–99)
Potassium: 3.2 mmol/L — ABNORMAL LOW (ref 3.5–5.1)
Sodium: 152 mmol/L — ABNORMAL HIGH (ref 135–145)
Total Bilirubin: 11.8 mg/dL — ABNORMAL HIGH (ref 0.3–1.2)
Total Protein: 6.7 g/dL (ref 6.5–8.1)

## 2020-08-29 LAB — CBC
HCT: 32.6 % — ABNORMAL LOW (ref 39.0–52.0)
Hemoglobin: 11.1 g/dL — ABNORMAL LOW (ref 13.0–17.0)
MCH: 31 pg (ref 26.0–34.0)
MCHC: 34 g/dL (ref 30.0–36.0)
MCV: 91.1 fL (ref 80.0–100.0)
Platelets: 341 10*3/uL (ref 150–400)
RBC: 3.58 MIL/uL — ABNORMAL LOW (ref 4.22–5.81)
RDW: 24.1 % — ABNORMAL HIGH (ref 11.5–15.5)
WBC: 13.2 10*3/uL — ABNORMAL HIGH (ref 4.0–10.5)
nRBC: 0 % (ref 0.0–0.2)

## 2020-08-29 LAB — BRAIN NATRIURETIC PEPTIDE: B Natriuretic Peptide: 57.7 pg/mL (ref 0.0–100.0)

## 2020-08-29 LAB — TROPONIN I (HIGH SENSITIVITY)
Troponin I (High Sensitivity): 10 ng/L (ref <18)
Troponin I (High Sensitivity): 10 ng/L (ref <18)

## 2020-08-29 LAB — GLUCOSE, CAPILLARY
Glucose-Capillary: 131 mg/dL — ABNORMAL HIGH (ref 70–99)
Glucose-Capillary: 136 mg/dL — ABNORMAL HIGH (ref 70–99)
Glucose-Capillary: 139 mg/dL — ABNORMAL HIGH (ref 70–99)
Glucose-Capillary: 167 mg/dL — ABNORMAL HIGH (ref 70–99)
Glucose-Capillary: 202 mg/dL — ABNORMAL HIGH (ref 70–99)
Glucose-Capillary: 225 mg/dL — ABNORMAL HIGH (ref 70–99)
Glucose-Capillary: 87 mg/dL (ref 70–99)
Glucose-Capillary: 93 mg/dL (ref 70–99)

## 2020-08-29 LAB — MAGNESIUM: Magnesium: 2.3 mg/dL (ref 1.7–2.4)

## 2020-08-29 LAB — PHOSPHORUS: Phosphorus: 2.4 mg/dL — ABNORMAL LOW (ref 2.5–4.6)

## 2020-08-29 MED ORDER — SIMETHICONE 40 MG/0.6ML PO SUSP
40.0000 mg | Freq: Once | ORAL | Status: AC
  Administered 2020-08-30: 40 mg
  Filled 2020-08-29: qty 0.6

## 2020-08-29 MED ORDER — LABETALOL HCL 5 MG/ML IV SOLN
10.0000 mg | INTRAVENOUS | Status: DC | PRN
  Administered 2020-08-29: 10 mg via INTRAVENOUS
  Filled 2020-08-29 (×2): qty 4

## 2020-08-29 MED ORDER — SPIRONOLACTONE 5 MG/ML ORAL SUSPENSION: 12.5000 mg | Freq: Every day | ORAL | Status: DC

## 2020-08-29 MED ORDER — INSULIN DETEMIR 100 UNIT/ML ~~LOC~~ SOLN
30.0000 [IU] | Freq: Two times a day (BID) | SUBCUTANEOUS | Status: DC
  Administered 2020-08-29 – 2020-09-03 (×11): 30 [IU] via SUBCUTANEOUS
  Filled 2020-08-29 (×12): qty 0.3

## 2020-08-29 MED ORDER — POTASSIUM CHLORIDE 20 MEQ PO PACK
40.0000 meq | PACK | ORAL | Status: AC
  Administered 2020-08-29 (×2): 40 meq
  Filled 2020-08-29 (×2): qty 2

## 2020-08-29 MED ORDER — SODIUM CHLORIDE 0.9% FLUSH
10.0000 mL | Freq: Two times a day (BID) | INTRAVENOUS | Status: DC
  Administered 2020-08-29 – 2020-09-04 (×11): 10 mL
  Administered 2020-09-05: 40 mL
  Administered 2020-09-06: 10 mL

## 2020-08-29 MED ORDER — SODIUM CHLORIDE 0.9% FLUSH
10.0000 mL | INTRAVENOUS | Status: DC | PRN
  Administered 2020-08-31: 10 mL

## 2020-08-29 MED ORDER — SPIRONOLACTONE 12.5 MG HALF TABLET
12.5000 mg | ORAL_TABLET | Freq: Every day | ORAL | Status: DC
  Administered 2020-08-29 – 2020-08-31 (×3): 12.5 mg
  Filled 2020-08-29 (×4): qty 1

## 2020-08-29 MED ORDER — AMLODIPINE BESYLATE 10 MG PO TABS
10.0000 mg | ORAL_TABLET | Freq: Every day | ORAL | Status: DC
  Administered 2020-08-29 – 2020-09-04 (×7): 10 mg via ORAL
  Filled 2020-08-29 (×7): qty 1

## 2020-08-29 MED ORDER — INSULIN ASPART 100 UNIT/ML IJ SOLN
4.0000 [IU] | INTRAMUSCULAR | Status: DC
  Administered 2020-08-29 – 2020-09-06 (×43): 4 [IU] via SUBCUTANEOUS

## 2020-08-29 MED ORDER — LOPERAMIDE HCL 1 MG/7.5ML PO SUSP
2.0000 mg | ORAL | Status: DC | PRN
  Administered 2020-08-29 – 2020-09-05 (×11): 2 mg
  Filled 2020-08-29 (×15): qty 15

## 2020-08-29 NOTE — TOC Progression Note (Signed)
Transition of Care [facility]) - Progression Note    Patient Details  Name: Jeff Jordan MRN: [id number] Date of Birth: 02/22/1955  Transition of [facility]) CM/SW Contact  Gwendolynn Merkey, Marjie Skiff, RN Phone Number: 08/29/2020, 11:48 AM  Clinical Narrative:    Pt has already been set up with [facility] for HHPT/RN services. Pt is now going home with tube feeds. Pam from [facility] contacted to follow along and provide tube feeds at dc. Pt will need orders for TF, and TF pump prior to dc. TOC will continue to follow.   Expected Discharge Plan: [place] Barriers to Discharge: Continued Medical Work up  Expected Discharge Plan and Services Expected Discharge Plan: [place]   Discharge Planning Services: CM Consult Post Acute Care Choice: [place] arrangements for the past 2 months: Single Family Home                 DME Arranged: Walker rolling DME Agency: [employer] Date DME Agency Contacted: 08/20/20 Time DME Agency Contacted: 1501 Representative spoke with at DME Agency: Freda Munro HH Arranged: OT, PT, RN [facility] Agency: [place] Date [place]: 08/20/20 Time [place]: 1501 Representative spoke with at [place]: [place] ([place]) Interventions    Readmission Risk Interventions No flowsheet data found.

## 2020-08-29 NOTE — Progress Notes (Signed)
PROGRESS NOTE  Jeff Jordan [id number] DOB: May 03, 1955   PCP: Georganna Skeans, PA-C  Patient is from: Home.  DOA: 08/14/2020 LOS: 14  Chief complaints:  No chief complaint on file.    Brief Narrative / Interim history: 65 year old M with PMH of pancreatic adenocarcinoma, DM-2, HTN and FTT presenting for biliary obstruction due to pancreatic adenocarcinoma after he failed biliary stent placement by ERCP at GI office on 7/19.  He had percutaneous drain placed on 7/21 that was converted to internal and external biliary drain on 7/22.  He underwent cholangiogram on 7/28.  Biliary fluid with Candida glabrata and staph epidermis.  Started on Diflucan and ceftriaxone on 7/29 which were since changed to Eraxis and IV Unasyn per ID recommendation.  LFT and bilirubin improving.  G-tube placed by IR on 8/1.  Tube feeds restarted.  Subjective: Seen and examined earlier this morning.  No major events overnight.  Has substernal chest pain this morning that he describes as pressure.  No radiation.  Feels short of breath and lightheaded but no nausea or diaphoresis.  He also had diarrhea overnight but denies abdominal pain.  Objective: Vitals:   08/29/20 0422 08/29/20 0500 08/29/20 1028 08/29/20 1453  BP: (!) 171/58  (!) 159/59 (!) 152/63  Pulse: (!) 106  (!) 104 (!) 108  Resp: '18  18 15  '$ Temp: 98.6 F (37 C)  98.4 F (36.9 C) 98.6 F (37 C)  TempSrc: Oral  Oral Oral  SpO2: 99%  100% 100%  Weight:  65.2 kg    Height:        Intake/Output Summary (Last 24 hours) at 08/29/2020 1702 Last data filed at 08/29/2020 0855 Gross per 24 hour  Intake 150 ml  Output 950 ml  Net -800 ml   Filed Weights   08/25/20 0413 08/27/20 0500 08/29/20 0500  Weight: 63.3 kg 61.2 kg 65.2 kg    Examination:  GENERAL: No apparent distress.  Nontoxic. HEENT: MMM.  Sclerae icteric. NECK: Supple.  No apparent JVD.  RESP:  No IWOB.  Fair aeration bilaterally. CVS:  RRR. Heart sounds normal.  ABD/GI/GU: BS+. Abd  soft, NTND. Biliary drain in place.  G-tube. MSK/EXT:  Moves extremities. No apparent deformity. No edema.  SKIN: Skin jaundice. NEURO: Awake and alert. Oriented appropriately.  No apparent focal neuro deficit. PSYCH: Calm. Normal affect.   Procedures:  -7/19-outpatient biliary stent placement unsuccessful -7/21-percutaneous drain placement -7/22-conversion of external biliary drain to internal and external biliary drain  -7/28-IR cholangiogram on 7/28 -8/1-G-tube placed by IR   Microbiology summarized: COVID-19 PCR nonreactive. Biliary culture with staph epidermis and Candida glabrata  Assessment & Plan: Biliary obstruction due to pancreatic adenocarcinoma Elevated liver enzymes/obstructive jaundice-LFT and bilirubin slightly up today. -Outpatient biliary stent placement on 7/19 unsuccessful -PERC biliary drain on 7/21, conversion to internal and external biliary drain on 7/22, IR cholangiogram on 7/28 -G-tube placement on 8/1 -Has referral to oncology office in [place] -Pain control -Hold home Crestor. -Antibiotics as below.  Biliary infection/cholangitis in the setting of the above: Biliary fluid culture with Candida glabrata and staph epidermis.  Staph epidermis likely contaminant.  -Appreciate ID recs  -Continue Eraxis for 5 days, end date 8/4.  -IV Unasyn>> p.o. Augmentin for a total of 5 days, end date 8/4 -Follow sensitivities on Candida  Chest pain: History concerning but EKG, troponin, BNP and x-ray reassuring.  Pain improved after he went back to bed.  Could be from malignancy -Pain medication  Diarrhea: Likely due to  tube feed.  Abdominal exam benign.  Not febrile.  Leukocytosis likely from cholangitis and improving. -Imodium per tube as needed.  Hypernatremia likely hypovolemic. Na 152 -Continue FW at 250 cc every 4 hours-May have to increase -Encourage p.o. water -Recheck in the morning  Controlled NIDDM-2 with hyperglycemia and hyperglycemia: On  Jardiance, metformin and Actos at home.  A1c 5.3%. Recent Labs  Lab 08/29/20 0211 08/29/20 0424 08/29/20 0715 08/29/20 1219 08/29/20 1622  GLUCAP 139* 131* 136* 225* 93  -Increase Levemir to 30 units twice daily -Continue SSI-moderate -Continue NovoLog 4 units every 4 hours for TF coverage -Continue holding home Crestor with elevated LFT  Essential hypertension: BP acceptable. -Increase amlodipine to 10 mg daily. -Continue hydralazine IV as needed  Hypokalemia: K3.2. -K-Dur 40 mill equivalent x2  Metabolic acidosis: Likely due to diarrhea -Manage diarrhea as above.   HLD: Hold Crestor in the setting of elevated LFT.   GERD: Protonix 40 mg IV daily   Thrush: Nystatin swish and swallow.  He is also on Eraxis now.  Generalized weakness/debility -PT/OT eval  Leukocytosis/bandemia: Likely due to biliary infection.  Improving. -Continue monitoring  Adult failure to thrive/moderate malnutrition-poor p.o. intake in the setting of adenocarcinoma. Body mass index is 20.62 kg/m. Nutrition Problem: Moderate Malnutrition Etiology: chronic illness, cancer and cancer related treatments Signs/Symptoms: mild fat depletion, mild muscle depletion, moderate muscle depletion Interventions: Tube feeding   DVT prophylaxis:  SCDs Start: 08/14/20 1624  Code Status: Full code Family Communication: Patient and/or RN. Available if any question.  Level of care: Med-Surg Status is: Inpatient  Remains inpatient appropriate because:Ongoing diagnostic testing needed not appropriate for outpatient work up, IV treatments appropriate due to intensity of illness or inability to take PO, and Inpatient level of care appropriate due to severity of illness  Dispo: The patient is from: Home              Anticipated d/c is to: Home              Patient currently is not medically stable to d/c.   Difficult to place patient No       Consultants:  GI IR ID   Sch Meds:  Scheduled Meds:   amLODipine  10 mg Oral Daily   amoxicillin-clavulanate  875 mg Per Tube Q12H   cholestyramine light  4 g Oral BID   feeding supplement  1 Container Oral TID BM   feeding supplement (PROSource TF)  45 mL Per Tube TID   ferrous sulfate  325 mg Oral Q breakfast   free water  250 mL Per Tube Q4H   insulin aspart  0-15 Units Subcutaneous TID WC   insulin aspart  0-5 Units Subcutaneous QHS   insulin aspart  4 Units Subcutaneous Q4H   insulin detemir  30 Units Subcutaneous BID   nystatin  5 mL Oral QID   pantoprazole  40 mg Intravenous Q1400   sodium chloride flush  10-40 mL Intracatheter Q12H   sodium chloride flush  5 mL Intracatheter Q8H   sodium chloride flush  5 mL Intracatheter Q8H   spironolactone  12.5 mg Per Tube Daily   Continuous Infusions:  anidulafungin 100 mg (08/29/20 1152)   feeding supplement (OSMOLITE 1.5 CAL) 1,000 mL (08/29/20 0956)   PRN Meds:.doxylamine (Sleep), labetalol, loperamide HCl, morphine injection, ondansetron (ZOFRAN) IV, oxyCODONE, sodium chloride flush  Antimicrobials: Anti-infectives (From admission, onward)    Start     Dose/Rate Route Frequency Ordered Stop   08/28/20 1300  amoxicillin-clavulanate (AUGMENTIN) 400-57 MG/5ML suspension 875 mg        875 mg Per Tube Every 12 hours 08/28/20 1152 08/31/20 0959   08/27/20 1500  vancomycin (VANCOCIN) IVPB 1000 mg/200 mL premix        1,000 mg 200 mL/hr over 60 Minutes Intravenous To Radiology 08/24/20 1644 08/27/20 1644   08/27/20 1000  anidulafungin (ERAXIS) 100 mg in sodium chloride 0.9 % 100 mL IVPB       See Hyperspace for full Linked Orders Report.   100 mg 78 mL/hr over 100 Minutes Intravenous Daily 08/26/20 1115 08/30/20 2359   08/26/20 1230  anidulafungin (ERAXIS) 200 mg in sodium chloride 0.9 % 200 mL IVPB       See Hyperspace for full Linked Orders Report.   200 mg 78 mL/hr over 200 Minutes Intravenous Once 08/26/20 1115 08/27/20 0008   08/26/20 1230  Ampicillin-Sulbactam (UNASYN) 3 g in  sodium chloride 0.9 % 100 mL IVPB  Status:  Discontinued        3 g 200 mL/hr over 30 Minutes Intravenous Every 6 hours 08/26/20 1147 08/28/20 1152   08/24/20 1800  cefTRIAXone (ROCEPHIN) 2 g in sodium chloride 0.9 % 100 mL IVPB  Status:  Discontinued        2 g 200 mL/hr over 30 Minutes Intravenous Every 24 hours 08/24/20 1640 08/26/20 1147   08/24/20 1800  fluconazole (DIFLUCAN) IVPB 100 mg  Status:  Discontinued        100 mg 50 mL/hr over 60 Minutes Intravenous Every 24 hours 08/24/20 1640 08/26/20 1115   08/21/20 1100  metroNIDAZOLE (FLAGYL) tablet 500 mg  Status:  Discontinued        500 mg Oral Every 8 hours 08/21/20 1002 08/22/20 0744   08/19/20 1800  metroNIDAZOLE (FLAGYL) IVPB 500 mg  Status:  Discontinued        500 mg 100 mL/hr over 60 Minutes Intravenous Every 8 hours 08/19/20 1614 08/21/20 1002   08/17/20 0800  ciprofloxacin (CIPRO) tablet 500 mg        500 mg Oral 2 times daily 08/17/20 0411 08/21/20 2152   08/16/20 0600  cefOXitin (MEFOXIN) 2 g in sodium chloride 0.9 % 100 mL IVPB        2 g 200 mL/hr over 30 Minutes Intravenous To Radiology 08/14/20 1620 08/16/20 1704        I have personally reviewed the following labs and images: CBC: Recent Labs  Lab 08/25/20 0718 08/26/20 0720 08/27/20 1015 08/28/20 0501 08/29/20 0947  WBC 15.3* 12.5* 13.4* 13.1* 13.2*  NEUTROABS  --   --  10.5*  --   --   HGB 10.1* 10.2* 10.3* 10.8* 11.1*  HCT 30.0* 30.1* 30.9* 32.3* 32.6*  MCV 90.4 89.9 92.2 93.1 91.1  PLT 235 256 260 282 341   BMP &GFR Recent Labs  Lab 08/22/20 1718 08/23/20 0606 08/25/20 0718 08/26/20 0720 08/27/20 1015 08/28/20 0501 08/29/20 0947  NA  --    < > 148* 147* 148* 153* 152*  K  --    < > 3.5 3.1* 5.1 4.0 3.2*  CL  --    < > 116* 116* 117* 119* 122*  CO2  --    < > '24 23 25 25 '$ 17*  GLUCOSE  --    < > 236* 103* 124* 240* 220*  BUN  --    < > '15 15 14 14 14  '$ CREATININE  --    < > 0.63  0.58* 0.38* 0.50* 0.62  CALCIUM  --    < > 9.1 9.0 8.9 9.0  9.2  MG  --    < > 2.1 2.2 2.3 2.2 2.3  PHOS 2.4*  --   --  2.8 3.2 3.1 2.4*   < > = values in this interval not displayed.   Estimated Creatinine Clearance: 83.8 mL/min (by C-G formula based on SCr of 0.62 mg/dL). Liver & Pancreas: Recent Labs  Lab 08/25/20 0718 08/26/20 0720 08/27/20 1015 08/28/20 0501 08/29/20 0947  AST 95* 108* 164* 171* 169*  ALT 89* 91* 103* 123* 136*  ALKPHOS 134* 136* 140* 156* 165*  BILITOT 13.5* 11.5* 12.6* 12.9* 11.8*  PROT 6.3* 6.4* 6.3* 6.5 6.7  ALBUMIN 2.4* 2.4* 2.3* 2.3* 2.6*   No results for input(s): LIPASE, AMYLASE in the last 168 hours. No results for input(s): AMMONIA in the last 168 hours. Diabetic: No results for input(s): HGBA1C in the last 72 hours.  Recent Labs  Lab 08/29/20 0211 08/29/20 0424 08/29/20 0715 08/29/20 1219 08/29/20 1622  GLUCAP 139* 131* 136* 225* 93   Cardiac Enzymes: No results for input(s): CKTOTAL, CKMB, CKMBINDEX, TROPONINI in the last 168 hours. No results for input(s): PROBNP in the last 8760 hours. Coagulation Profile: Recent Labs  Lab 08/27/20 1015  INR 1.2   Thyroid Function Tests: No results for input(s): TSH, T4TOTAL, FREET4, T3FREE, THYROIDAB in the last 72 hours. Lipid Profile: No results for input(s): CHOL, HDL, LDLCALC, TRIG, CHOLHDL, LDLDIRECT in the last 72 hours. Anemia Panel: No results for input(s): VITAMINB12, FOLATE, FERRITIN, TIBC, IRON, RETICCTPCT in the last 72 hours. Urine analysis: No results found for: COLORURINE, APPEARANCEUR, LABSPEC, PHURINE, GLUCOSEU, HGBUR, BILIRUBINUR, KETONESUR, PROTEINUR, UROBILINOGEN, NITRITE, LEUKOCYTESUR Sepsis Labs: Invalid input(s): PROCALCITONIN, [place]  Microbiology: Recent Results (from the past 240 hour(s))  Body fluid culture w Gram Stain     Status: None (Preliminary result)   Collection Time: 08/22/20 11:12 AM   Specimen: BILE; Body Fluid  Result Value Ref Range Status   Specimen Description   Final    BILE Performed at [facility] [address] [place], [place] [address]    Special Requests   Final    Normal Performed at [facility], [place] [address] [place], [place] [address]    Gram Stain   Final    NO WBC SEEN MODERATE YEAST FEW GRAM POSITIVE COCCI    Culture   Final    MODERATE STAPHYLOCOCCUS EPIDERMIDIS MODERATE CANDIDA GLABRATA Sent to [place] for further susceptibility testing. Performed at [facility], [place] [address], [place], [place] [address]    Report Status PENDING  Incomplete   Organism ID, Bacteria STAPHYLOCOCCUS EPIDERMIDIS  Final      Susceptibility   Staphylococcus epidermidis - MIC*    CIPROFLOXACIN >=8 RESISTANT Resistant     ERYTHROMYCIN >=8 RESISTANT Resistant     GENTAMICIN <=0.5 SENSITIVE Sensitive     OXACILLIN >=4 RESISTANT Resistant     TETRACYCLINE >=16 RESISTANT Resistant     VANCOMYCIN 1 SENSITIVE Sensitive     TRIMETH/SULFA <=10 SENSITIVE Sensitive     CLINDAMYCIN >=8 RESISTANT Resistant     RIFAMPIN <=0.5 SENSITIVE Sensitive     Inducible Clindamycin NEGATIVE Sensitive     * MODERATE STAPHYLOCOCCUS EPIDERMIDIS  Antifungal AST 9 Drug Panel     Status: None (Preliminary result)   Collection Time: 08/22/20 11:12 AM  Result Value Ref Range Status   Organism ID, Yeast  Preliminary report  Final    Comment: (NOTE) Specimen has been received and testing has been initiated. Performed At: [facility] [place], [place] [id number] Rush Farmer MD [id number]    Amphotericin B MIC PENDING  Incomplete   Anidulafungin MIC PENDING  Incomplete   Caspofungin MIC PENDING  Incomplete   Micafungin MIC PENDING  Incomplete   Posaconazole MIC PENDING  Incomplete   Fluconazole Islt MIC PENDING  Incomplete   Flucytosine MIC PENDING  Incomplete   Itraconazole MIC PENDING  Incomplete   Voriconazole MIC PENDING  Incomplete   Source 183,[id number]  Final    Comment: Performed at [facility], [address], [place], [place] [address]  Culture, blood (routine x 2)     Status: None (Preliminary result)   Collection Time: 08/26/20  2:11 PM   Specimen: BLOOD  Result Value Ref Range Status   Specimen Description   Final    BLOOD BLOOD RIGHT HAND Performed at [facility] [address] [place], [place] [address]    Special Requests   Final    BOTTLES DRAWN AEROBIC ONLY Blood Culture adequate volume Performed at [facility] [address], [place], [place] [address]    Culture   Final    NO GROWTH 3 DAYS Performed at [facility], [place] [address] [place], [place] [address]    Report Status PENDING  Incomplete    Radiology Studies: DG Chest Port 1 View  Result Date: 08/29/2020 CLINICAL DATA:  Central chest pain EXAM: PORTABLE CHEST 1 VIEW COMPARISON:  None. FINDINGS: The heart size and mediastinal contours are within normal limits. Low lung volumes with mild bibasilar atelectasis. Lungs are otherwise clear. No pleural effusion or pneumothorax. Partially visualized catheter tubing overlies the right hemiabdomen. The visualized skeletal structures are unremarkable. IMPRESSION: Low lung volumes with mild bibasilar atelectasis. Electronically Signed   By: Davina Poke D.O.   On: 08/29/2020 12:26      Reka Wist T. Billings  If 7PM-7AM, please contact night-coverage [email or web address] 08/29/2020, 5:02 PM

## 2020-08-29 NOTE — Progress Notes (Signed)
Pt c/o of chest pain to upper mid chest that does not radiate and some SOB. VS obtained and stable. MD notified and at bedside. See new orders.

## 2020-08-29 NOTE — Progress Notes (Signed)
Patient ID: Jeff Jordan, male   DOB: [birth year], 65 y.o.   MRN: [id number]    Referring Physician(s): Mansouraty,G/[place],E  Supervising Physician: Aletta Edouard  Patient Status:  WL IP  Chief Complaint:  Pancreatic cancer with malignant obstructive jaundice, intermittent nausea /vomiting, malnutrition, poor oral intake, duodenal stenosis  Subjective: Patient sitting up in chair; main complaint now is diarrhea; denies vomiting; also has some soreness at G-tube insertion site which is not unexpected   Allergies: Patient has no known allergies.  Medications: Prior to Admission medications   Medication Sig Start Date End Date Taking? Authorizing Provider  amLODipine (NORVASC) 5 MG tablet Take 5 mg by mouth daily. 06/19/20  Yes [provider]  aspirin 81 MG EC tablet Take 81 mg by mouth daily.   Yes [provider]  ferrous sulfate 325 (65 FE) MG tablet Take 325 mg by mouth daily with breakfast.   Yes [provider]  JARDIANCE 25 MG TABS tablet Take 25 mg by mouth daily. 07/23/20  Yes [provider]  loperamide (IMODIUM) 2 MG capsule Take by mouth as needed for diarrhea or loose stools.   Yes [provider]  metFORMIN (GLUCOPHAGE) 1000 MG tablet Take 1,000 mg by mouth 2 (two) times daily. 07/16/20  Yes [provider]  olmesartan (BENICAR) 40 MG tablet Take 40 mg by mouth daily. 06/25/20  Yes [provider]  omeprazole (PRILOSEC) 40 MG capsule Take 40 mg by mouth every morning. 03/01/20  Yes [provider]  ondansetron (ZOFRAN) 4 MG tablet Take 1 tablet (4 mg total) by mouth every 4 (four) hours as needed for nausea. 08/02/20  Yes Dayton Scrape A, NP  Oxycodone HCl 10 MG TABS Take 1 tablet (10 mg total) by mouth every 4 (four) hours as needed. Patient taking differently: Take 10 mg by mouth every 4 (four) hours as needed (pain). 08/02/20  Yes Dayton Scrape A, NP  pioglitazone (ACTOS) 45 MG tablet Take 45 mg  by mouth daily. 06/25/20  Yes [provider]  rosuvastatin (CRESTOR) 5 MG tablet Take 5 mg by mouth once a week. 06/25/20  Yes [provider]     Vital Signs: BP (!) 171/58   Pulse (!) 106   Temp 98.6 F (37 C) (Oral)   Resp 18   Ht '5\' 10"'$  (1.778 m)   Wt 143 lb 11.8 oz (65.2 kg)   SpO2 99%   BMI 20.62 kg/m   Physical Exam awake, alert.  Scleral icterus persist.  Right biliary drain intact, insertion site okay, no leaking, output 50 cc of green bile, gastrostomy tube intact, dressing clean and dry, site mildly tender to palpation, tube feeds currently running  Imaging: IR GASTROSTOMY TUBE MOD SED  Result Date: 08/27/2020 INDICATION: 65 year old male with metastatic pancreatic cancer with possible duodenal obstruction. EXAM: PERC PLACEMENT GASTROSTOMY MEDICATIONS: Vancomycin 1 IV; Antibiotics were administered within 1 hour of the procedure. ANESTHESIA/SEDATION: The patient was continuously monitored during the procedure by the interventional radiology nurse under my direct supervision. Versed 2 mg IV; dilaudid 1 mg IV Moderate Sedation Time:  24 CONTRAST:  90m OMNIPAQUE IOHEXOL 300 MG/ML SOLN - administered into the gastric lumen. FLUOROSCOPY TIME:  Fluoroscopy Time: 4 minutes 12 seconds (58 mGy). COMPLICATIONS: None immediate. PROCEDURE: Informed written consent was obtained from the patient after a thorough discussion of the procedural risks, benefits and alternatives. All questions were addressed. Maximal Sterile Barrier Technique was utilized including caps, mask, sterile gowns, sterile gloves, sterile drape, hand hygiene  and skin antiseptic. A timeout was performed prior to the initiation of the procedure. The patient was placed on the procedure table in the supine position. Pre-procedure abdominal film confirmed visualization of the transverse colon. The patient was prepped and draped in usual sterile fashion. The stomach was insufflated with air via the indwelling  nasogastric tube. Under fluoroscopy, a puncture site was selected and local analgesia achieved with 1% lidocaine infiltrated subcutaneously. Under fluoroscopic guidance, a gastropexy needle was passed into the stomach and the T-bar suture was released. Entry into the stomach was confirmed with fluoroscopy, aspiration of air, and injection of contrast material. This was repeated with an additional gastropexy suture (for a total of 2 fasteners). At the center of these gastropexy sutures, a dermatotomy was performed. An 18 gauge needle was passed into the stomach at the site of this dermatotomy, and position within the gastric lumen again confirmed under fluoroscopy using aspiration of air and contrast injection. An Amplatz guidewire was passed through this needle and intraluminal placement within the stomach was confirmed by fluoroscopy. The needle was removed. Over the guidewire, the percutaneous tract was dilated using a 10 mm non-compliant balloon. The balloon was deflated, then pushed into the gastric lumen followed in concert by the 20 Fr gastrostomy tube. The retention balloon of the percutaneous gastrostomy tube was inflated with 20 mL of sterile water. The tube was withdrawn until the retention balloon was at the edge of the gastric lumen. The external bumper was brought to the abdominal wall. Contrast was injected through the gastrostomy tube, confirming intraluminal positioning. Attempted was made it jejunal tube placement, however the pylorus was unable be engaged. The patient tolerated the procedure well without any immediate post-procedural complications. IMPRESSION: Technically successful placement of 20 Fr gastrostomy tube. Unsuccessful attempted jejunostomy tube placement due to inability to cannulate the pylorus. PLAN: Attempt at jejunal arm placement at a later date could be pursued upon request as clinically indicated. Ruthann Cancer, MD [facility]  Radiology Electronically Signed   By: Ruthann Cancer MD   On: 08/27/2020 16:52    Labs:  CBC: Recent Labs    08/25/20 0718 08/26/20 0720 08/27/20 1015 08/28/20 0501  WBC 15.3* 12.5* 13.4* 13.1*  HGB 10.1* 10.2* 10.3* 10.8*  HCT 30.0* 30.1* 30.9* 32.3*  PLT 235 256 260 282    COAGS: Recent Labs    08/14/20 1934 08/15/20 0452 08/16/20 0553 08/27/20 1015  INR 1.7* 1.5* 1.1 1.2    BMP: Recent Labs    08/25/20 0718 08/26/20 0720 08/27/20 1015 08/28/20 0501  NA 148* 147* 148* 153*  K 3.5 3.1* 5.1 4.0  CL 116* 116* 117* 119*  CO2 '24 23 25 25  '$ GLUCOSE 236* 103* 124* 240*  BUN '15 15 14 14  '$ CALCIUM 9.1 9.0 8.9 9.0  CREATININE 0.63 0.58* 0.38* 0.50*  GFRNONAA >60 >60 >60 >60    LIVER FUNCTION TESTS: Recent Labs    08/25/20 0718 08/26/20 0720 08/27/20 1015 08/28/20 0501  BILITOT 13.5* 11.5* 12.6* 12.9*  AST 95* 108* 164* 171*  ALT 89* 91* 103* 123*  ALKPHOS 134* 136* 140* 156*  PROT 6.3* 6.4* 6.3* 6.5  ALBUMIN 2.4* 2.4* 2.3* 2.3*    Assessment and Plan: Patient with history of pancreatic cancer with malignant biliary obstruction, status post external biliary drain placement on 7/21 and conversion to internal and external biliary drain on 7/22.  Patient unable to undergo ERCP due to duodenal stenosis; now with persistent minimal oral intake;  status post percutaneous gastrostomy placement yesterday-unsuccessful attempt at jejunostomy tube placement due to inability to cannulate the pylorus ; afebrile, no new lab data today; drain output 250 cc green bile; recent follow-up cholangiogram revealed appropriate positioning of catheter; continue current treatment, lab monitoring, biliary drain irrigation; once significant drop in bilirubin noted, could consider capping trial; attempt at jejunal arm placement at a later date could be pursued upon request as clinically indicated; if clinical condition worsens consider f/u CT   Electronically Signed: D. Rowe Robert,  PA-C 08/29/2020, 9:54 AM   I spent a total of 15 Minutes at the the patient's bedside AND on the patient's hospital floor or unit, greater than 50% of which was counseling/coordinating care for biliary drain and gastrostomy tube

## 2020-08-30 DIAGNOSIS — R112 Nausea with vomiting, unspecified: Secondary | ICD-10-CM | POA: Diagnosis not present

## 2020-08-30 DIAGNOSIS — C259 Malignant neoplasm of pancreas, unspecified: Secondary | ICD-10-CM | POA: Diagnosis not present

## 2020-08-30 DIAGNOSIS — K831 Obstruction of bile duct: Secondary | ICD-10-CM | POA: Diagnosis not present

## 2020-08-30 DIAGNOSIS — E440 Moderate protein-calorie malnutrition: Secondary | ICD-10-CM | POA: Diagnosis not present

## 2020-08-30 LAB — CBC
HCT: 29.5 % — ABNORMAL LOW (ref 39.0–52.0)
Hemoglobin: 9.7 g/dL — ABNORMAL LOW (ref 13.0–17.0)
MCH: 31.3 pg (ref 26.0–34.0)
MCHC: 32.9 g/dL (ref 30.0–36.0)
MCV: 95.2 fL (ref 80.0–100.0)
Platelets: 321 10*3/uL (ref 150–400)
RBC: 3.1 MIL/uL — ABNORMAL LOW (ref 4.22–5.81)
RDW: 24.5 % — ABNORMAL HIGH (ref 11.5–15.5)
WBC: 12.8 10*3/uL — ABNORMAL HIGH (ref 4.0–10.5)
nRBC: 0 % (ref 0.0–0.2)

## 2020-08-30 LAB — COMPREHENSIVE METABOLIC PANEL
ALT: 127 U/L — ABNORMAL HIGH (ref 0–44)
AST: 139 U/L — ABNORMAL HIGH (ref 15–41)
Albumin: 2.2 g/dL — ABNORMAL LOW (ref 3.5–5.0)
Alkaline Phosphatase: 156 U/L — ABNORMAL HIGH (ref 38–126)
Anion gap: 7 (ref 5–15)
BUN: 14 mg/dL (ref 8–23)
CO2: 22 mmol/L (ref 22–32)
Calcium: 9.2 mg/dL (ref 8.9–10.3)
Chloride: 121 mmol/L — ABNORMAL HIGH (ref 98–111)
Creatinine, Ser: 0.66 mg/dL (ref 0.61–1.24)
GFR, Estimated: >60 mL/min (ref >60)
Glucose, Bld: 126 mg/dL — ABNORMAL HIGH (ref 70–99)
Potassium: 3.7 mmol/L (ref 3.5–5.1)
Sodium: 150 mmol/L — ABNORMAL HIGH (ref 135–145)
Total Bilirubin: 9.8 mg/dL — ABNORMAL HIGH (ref 0.3–1.2)
Total Protein: 6.3 g/dL — ABNORMAL LOW (ref 6.5–8.1)

## 2020-08-30 LAB — ANTIFUNGAL AST 9 DRUG PANEL
Amphotericin B MIC: 0.5
Fluconazole Islt MIC: 16
Flucytosine MIC: 0.06
Itraconazole MIC: 1
Posaconazole MIC: 2
Source: 183119
Voriconazole MIC: 0.25

## 2020-08-30 LAB — GLUCOSE, CAPILLARY
Glucose-Capillary: 206 mg/dL — ABNORMAL HIGH (ref 70–99)
Glucose-Capillary: 192 mg/dL — ABNORMAL HIGH (ref 70–99)
Glucose-Capillary: 179 mg/dL — ABNORMAL HIGH (ref 70–99)
Glucose-Capillary: 253 mg/dL — ABNORMAL HIGH (ref 70–99)
Glucose-Capillary: 156 mg/dL — ABNORMAL HIGH (ref 70–99)

## 2020-08-30 LAB — PHOSPHORUS: Phosphorus: 2.5 mg/dL (ref 2.5–4.6)

## 2020-08-30 LAB — MAGNESIUM: Magnesium: 2.2 mg/dL (ref 1.7–2.4)

## 2020-08-30 MED ORDER — IRBESARTAN 150 MG PO TABS
150.0000 mg | ORAL_TABLET | Freq: Every day | ORAL | Status: DC
  Administered 2020-08-30 – 2020-08-31 (×2): 150 mg via ORAL
  Filled 2020-08-30 (×2): qty 1

## 2020-08-30 MED ORDER — IRBESARTAN 150 MG PO TABS: 150.0000 mg | ORAL_TABLET | Freq: Every day | ORAL | Status: DC

## 2020-08-30 MED ORDER — FREE WATER
300.0000 mL | Status: DC
  Administered 2020-08-30 – 2020-09-01 (×9): 300 mL

## 2020-08-30 NOTE — TOC Progression Note (Signed)
Transition of Care [facility]) - Progression Note    Patient Details  Name: Jeff Jordan MRN: [id number] Date of Birth: 05-12-1955  Transition of Care [facility]) CM/SW Contact  Tamya Denardo, Marjie Skiff, RN Phone Number: 08/30/2020, 3:05 PM  Clinical Narrative:    Daughter received teaching on tube feeds by [facility] liaison today. Orders received for tube feeds and pump. [employer] to supply. [employer] contacted for 3in1.   Expected Discharge Plan: [place] Barriers to Discharge: Continued Medical Work up  Expected Discharge Plan and Services Expected Discharge Plan: [place]   Discharge Planning Services: CM Consult Post Acute Care Choice: [employer] arrangements for the past 2 months: Single Family Home                 DME Arranged: [place] rolling DME Agency: [employer] Date DME Agency Contacted: 08/20/20 Time DME Agency Contacted: 1501 Representative spoke with at DME Agency: Freda Munro HH Arranged: OT, PT, RN [facility] Agency: [place] Date [place]: 08/20/20 Time [place]: 1501 Representative spoke with at [place]: [place] ([place]) Interventions    Readmission Risk Interventions No flowsheet data found.

## 2020-08-30 NOTE — Progress Notes (Signed)
Physical Therapy Treatment Patient Details Name: Jeff Jordan MRN: [id number] DOB: 10-Mar-1955 Today's Date: 08/30/2020    History of Present Illness 65 year old M with PMH of pancreatic adenocarcinoma, DM-2, HTN and FTT presenting for biliary obstruction due to pancreatic adenocarcinoma after he failed biliary stent placement by ERCP at GI office on 7/19.  He had percutaneous drain placed on 7/21 that was converted to internal and external biliary drain on 7/22.  He underwent cholangiogram on 7/28.  Biliary fluid with Candida glabrata and staph epidermis.  Started on Diflucan and ceftriaxone on 7/29 which were since changed to Eraxis and IV Unasyn per ID recommendation.  LFT and bilirubin improving.  G-tube placed by IR on 8/1.  Tube feeds restarted.    PT Comments    Spoke with daughter in hallway earlier.  Informed her of her dad mobility ability.  Supervision using walker for slight support.  Will not need long term.  Instructed on importance of amb several times a day and increasing activity level slowly to tolerance. Assisted pt OOB first to [facility] as a precaution to any uncontrolled loose stools (new tube feeding).  Assisted with peri care.  Then amb a functional distance in hallway.  General Gait Details: slow but steady.  Tolerated an increased distance.  Slight forward flex posture due to ABD discomfort. Positioned upright in recliner and set up lunch tray (liquids).   Pt progressing well with his mobility, gaining both strength and energy after tube feeding.   Follow Up Recommendations  Home health PT     Equipment Recommendations  Rolling walker with 5" wheels;3in1 (PT)    Recommendations for Other Services       Precautions / Restrictions Precautions Precautions: Fall Precaution Comments: bilary drain on R, G tube, abdominal binder    Mobility  Bed Mobility Overal bed mobility: Needs Assistance Bed Mobility: Supine to Sit     Supine to sit: Supervision     General  bed mobility comments: Supervision for safety/ drain management plus increased time    Transfers Overall transfer level: Needs assistance Equipment used: Rolling walker (2 wheeled) Transfers: Sit to/from [facility] Sit to Stand: Supervision Stand pivot transfers: Supervision       General transfer comment: increased time and one VC to avoid pulling up on walker  Ambulation/Gait Ambulation/Gait assistance: Supervision Gait Distance (Feet): 130 Feet Assistive device: Rolling walker (2 wheeled) Gait Pattern/deviations: Step-through pattern;Decreased stride length;Trunk flexed;Shuffle Gait velocity: decreased   General Gait Details: slow but steady.  Tolerated an increased distance.  Slight forward flex posture due to ABD discomfort.   Stairs             Wheelchair Mobility    Modified Rankin (Stroke Patients Only)       Balance                                            Cognition Arousal/Alertness: Awake/alert Behavior During Therapy: WFL for tasks assessed/performed;Flat affect Overall Cognitive Status: Within Functional Limits for tasks assessed                                 General Comments: AxO x 3 pleasant and always willing      Exercises      General Comments        Pertinent  Vitals/Pain Pain Assessment: Faces Faces Pain Scale: Hurts a little bit Pain Location: abdomen "Right side" Pain Descriptors / Indicators: Aching;Discomfort Pain Intervention(s): Monitored during session    Home Living                      Prior Function            PT Goals (current goals can now be found in the care plan section) Progress towards PT goals: Progressing toward goals    Frequency    Min 3X/week      PT Plan Current plan remains appropriate    Co-evaluation              AM-PAC PT "6 Clicks" Mobility   Outcome Measure  Help needed turning from your back to your side while in  a flat bed without using bedrails?: A Little Help needed moving from lying on your back to sitting on the side of a flat bed without using bedrails?: A Little Help needed moving to and from a bed to a chair (including a wheelchair)?: A Little Help needed standing up from a chair using your arms (e.g., wheelchair or bedside chair)?: A Little Help needed to walk in hospital room?: A Little Help needed climbing 3-5 steps with a railing? : A Little 6 Click Score: 18    End of Session Equipment Utilized During Treatment: Gait belt Activity Tolerance: Patient tolerated treatment well Patient left: in chair;with call bell/phone within reach;with chair alarm set Nurse Communication: Mobility status PT Visit Diagnosis: Difficulty in walking, not elsewhere classified (R26.2)     Time: 1315-1340 PT Time Calculation (min) (ACUTE ONLY): 25 min  Charges:  $Gait Training: 8-22 mins $Therapeutic Activity: 8-22 mins                     {Jeff Jordan  PTA Acute  Rehabilitation Services Pager      [phone] Office      [phone]

## 2020-08-30 NOTE — Progress Notes (Signed)
Patient ID: Jeff Jordan, male   DOB: 1955/08/04, 65 y.o.   MRN: [id number]    Referring Physician(s): Mansouraty,G/[place],E  Supervising Physician: Dr. Annamaria Boots  Patient Status:  WL IP  Chief Complaint:  Pancreatic cancer with malignant obstructive jaundice, intermittent nausea /vomiting, malnutrition, poor oral intake, duodenal stenosis  Subjective: Patient sitting up in chair Stools still loose. No new pain or complaints   Allergies: Patient has no known allergies.  Medications:  Current Facility-Administered Medications:    amLODipine (NORVASC) tablet 10 mg, 10 mg, Oral, Daily, Cyndia Skeeters, Taye T, MD, 10 mg at 08/30/20 0955   amoxicillin-clavulanate (AUGMENTIN) 400-57 MG/5ML suspension 875 mg, 875 mg, Per Tube, Q12H, Wendee Beavers T, MD, 875 mg at 08/30/20 0955   [COMPLETED] anidulafungin (ERAXIS) 200 mg in sodium chloride 0.9 % 200 mL IVPB, 200 mg, Intravenous, Once, Stopped at 08/27/20 0008 **FOLLOWED BY** anidulafungin (ERAXIS) 100 mg in sodium chloride 0.9 % 100 mL IVPB, 100 mg, Intravenous, Daily, Mignon Pine, DO, Stopped at 08/29/20 1335   cholestyramine light (PREVALITE) packet 4 g, 4 g, Oral, BID, [place] Donnamarie Poag, DO, 4 g at 08/30/20 [id number]   doxylamine (Sleep) (UNISOM) tablet 25 mg, 25 mg, Oral, QHS PRN, Lang Snow, NP, 25 mg at 08/28/20 2241   feeding supplement (BOOST / RESOURCE BREEZE) liquid 1 Container, 1 Container, Oral, TID BM, Nicole Kindred A, DO, 1 Container at 08/29/20 2027   feeding supplement (OSMOLITE 1.5 CAL) liquid 1,000 mL, 1,000 mL, Per Tube, Continuous, [place] Eric J, DO, Last Rate: 55 mL/hr at 08/30/20 0514, Rate Change at 08/30/20 0514   feeding supplement (PROSource TF) liquid 45 mL, 45 mL, Per Tube, TID, [place] Eric J, DO, 45 mL at 08/30/20 0955   ferrous sulfate tablet 325 mg, 325 mg, Oral, Q breakfast, Rise Patience, MD, 325 mg at 08/30/20 [id number]   free water 250 mL, 250 mL, Per Tube, Q4H, Gonfa, Taye T, MD, 250 mL at  08/30/20 0811   insulin aspart (novoLOG) injection 0-15 Units, 0-15 Units, Subcutaneous, TID WC, [place] Eric J, DO, 3 Units at 08/30/20 [id number]   insulin aspart (novoLOG) injection 0-5 Units, 0-5 Units, Subcutaneous, QHS, [place] Donnamarie Poag, DO, 2 Units at 08/25/20 2217   insulin aspart (novoLOG) injection 4 Units, 4 Units, Subcutaneous, Q4H, Gonfa, Taye T, MD, 4 Units at 08/30/20 0810   insulin detemir (LEVEMIR) injection 30 Units, 30 Units, Subcutaneous, BID, Gonfa, Taye T, MD, 30 Units at 08/30/20 0956   irbesartan (AVAPRO) tablet 150 mg, 150 mg, Oral, Daily, Gonfa, Taye T, MD, 150 mg at 08/30/20 0955   labetalol (NORMODYNE) injection 10 mg, 10 mg, Intravenous, Q2H PRN, Cyndia Skeeters, Taye T, MD, 10 mg at 08/29/20 2047   loperamide HCl (IMODIUM) 1 MG/7.5ML suspension 2 mg, 2 mg, Per Tube, PRN, Cyndia Skeeters, Taye T, MD, 2 mg at 08/30/20 0955   morphine 2 MG/ML injection 2 mg, 2 mg, Intravenous, Q2H PRN, [place] Donnamarie Poag, DO, 2 mg at 08/27/20 2243   nystatin (MYCOSTATIN) 100000 UNIT/ML suspension 500,000 Units, 5 mL, Oral, QID, Willia Craze, NP, 500,000 Units at 08/30/20 0956   ondansetron (ZOFRAN) injection 4 mg, 4 mg, Intravenous, Q6H PRN, Willia Craze, NP, 4 mg at 08/29/20 2042   oxyCODONE (Oxy IR/ROXICODONE) immediate release tablet 10 mg, 10 mg, Oral, Q6H PRN, [place] Donnamarie Poag, DO, 10 mg at 08/30/20 0013   pantoprazole (PROTONIX) injection 40 mg, 40 mg, Intravenous, Q1400, Willia Craze, NP, 40 mg at 08/29/20 1231   sodium chloride  flush (NS) 0.9 % injection 10-40 mL, 10-40 mL, Intracatheter, Q12H, Gonfa, Taye T, MD, 10 mL at 08/30/20 0957   sodium chloride flush (NS) 0.9 % injection 10-40 mL, 10-40 mL, Intracatheter, PRN, Cyndia Skeeters, Taye T, MD   sodium chloride flush (NS) 0.9 % injection 5 mL, 5 mL, Intracatheter, Q8H, Sandi Mariscal, MD, 5 mL at 08/30/20 0511   sodium chloride flush (NS) 0.9 % injection 5 mL, 5 mL, Intracatheter, Q8H, Criselda Peaches, MD, 5 mL at 08/30/20 0510   spironolactone  (ALDACTONE) tablet 12.5 mg, 12.5 mg, Per Tube, Daily, Eudelia Bunch, RPH, 12.5 mg at 08/30/20 0955    Vital Signs: BP (!) 147/68 (BP Location: Left Arm)   Pulse 96   Temp 98.5 F (36.9 C) (Oral)   Resp 18   Ht '5\' 10"'$  (1.778 m)   Wt 66.7 kg   SpO2 100%   BMI 21.10 kg/m   Physical Exam awake, alert. Right biliary drain intact, insertion site okay, no leaking, output of green bile. G-tube intact, site clean, TF currently running  Imaging: IR GASTROSTOMY TUBE MOD SED  Result Date: 08/27/2020 INDICATION: 65 year old male with metastatic pancreatic cancer with possible duodenal obstruction. EXAM: PERC PLACEMENT GASTROSTOMY MEDICATIONS: Vancomycin 1 IV; Antibiotics were administered within 1 hour of the procedure. ANESTHESIA/SEDATION: The patient was continuously monitored during the procedure by the interventional radiology nurse under my direct supervision. Versed 2 mg IV; dilaudid 1 mg IV Moderate Sedation Time:  24 CONTRAST:  78m OMNIPAQUE IOHEXOL 300 MG/ML SOLN - administered into the gastric lumen. FLUOROSCOPY TIME:  Fluoroscopy Time: 4 minutes 12 seconds (58 mGy). COMPLICATIONS: None immediate. PROCEDURE: Informed written consent was obtained from the patient after a thorough discussion of the procedural risks, benefits and alternatives. All questions were addressed. Maximal Sterile Barrier Technique was utilized including caps, mask, sterile gowns, sterile gloves, sterile drape, hand hygiene and skin antiseptic. A timeout was performed prior to the initiation of the procedure. The patient was placed on the procedure table in the supine position. Pre-procedure abdominal film confirmed visualization of the transverse colon. The patient was prepped and draped in usual sterile fashion. The stomach was insufflated with air via the indwelling nasogastric tube. Under fluoroscopy, a puncture site was selected and local analgesia achieved with 1% lidocaine infiltrated subcutaneously. Under  fluoroscopic guidance, a gastropexy needle was passed into the stomach and the T-bar suture was released. Entry into the stomach was confirmed with fluoroscopy, aspiration of air, and injection of contrast material. This was repeated with an additional gastropexy suture (for a total of 2 fasteners). At the center of these gastropexy sutures, a dermatotomy was performed. An 18 gauge needle was passed into the stomach at the site of this dermatotomy, and position within the gastric lumen again confirmed under fluoroscopy using aspiration of air and contrast injection. An Amplatz guidewire was passed through this needle and intraluminal placement within the stomach was confirmed by fluoroscopy. The needle was removed. Over the guidewire, the percutaneous tract was dilated using a 10 mm non-compliant balloon. The balloon was deflated, then pushed into the gastric lumen followed in concert by the 20 Fr gastrostomy tube. The retention balloon of the percutaneous gastrostomy tube was inflated with 20 mL of sterile water. The tube was withdrawn until the retention balloon was at the edge of the gastric lumen. The external bumper was brought to the abdominal wall. Contrast was injected through the gastrostomy tube, confirming intraluminal positioning. Attempted was made it jejunal tube placement, however the  pylorus was unable be engaged. The patient tolerated the procedure well without any immediate post-procedural complications. IMPRESSION: Technically successful placement of 20 Fr gastrostomy tube. Unsuccessful attempted jejunostomy tube placement due to inability to cannulate the pylorus. PLAN: Attempt at jejunal arm placement at a later date could be pursued upon request as clinically indicated. Ruthann Cancer, MD [facility] Radiology Electronically Signed   By: Ruthann Cancer MD   On: 08/27/2020 16:52   DG Chest Port 1 View  Result Date: 08/29/2020 CLINICAL DATA:   Central chest pain EXAM: PORTABLE CHEST 1 VIEW COMPARISON:  None. FINDINGS: The heart size and mediastinal contours are within normal limits. Low lung volumes with mild bibasilar atelectasis. Lungs are otherwise clear. No pleural effusion or pneumothorax. Partially visualized catheter tubing overlies the right hemiabdomen. The visualized skeletal structures are unremarkable. IMPRESSION: Low lung volumes with mild bibasilar atelectasis. Electronically Signed   By: Davina Poke D.O.   On: 08/29/2020 12:26    Labs:  CBC: Recent Labs    08/26/20 0720 08/27/20 1015 08/28/20 0501 08/29/20 0947  WBC 12.5* 13.4* 13.1* 13.2*  HGB 10.2* 10.3* 10.8* 11.1*  HCT 30.1* 30.9* 32.3* 32.6*  PLT 256 260 282 341     COAGS: Recent Labs    08/14/20 1934 08/15/20 0452 08/16/20 0553 08/27/20 1015  INR 1.7* 1.5* 1.1 1.2     BMP: Recent Labs    08/26/20 0720 08/27/20 1015 08/28/20 0501 08/29/20 0947  NA 147* 148* 153* 152*  K 3.1* 5.1 4.0 3.2*  CL 116* 117* 119* 122*  CO2 '23 25 25 '$ 17*  GLUCOSE 103* 124* 240* 220*  BUN '15 14 14 14  '$ CALCIUM 9.0 8.9 9.0 9.2  CREATININE 0.58* 0.38* 0.50* 0.62  GFRNONAA >60 >60 >60 >60     LIVER FUNCTION TESTS: Recent Labs    08/26/20 0720 08/27/20 1015 08/28/20 0501 08/29/20 0947  BILITOT 11.5* 12.6* 12.9* 11.8*  AST 108* 164* 171* 169*  ALT 91* 103* 123* 136*  ALKPHOS 136* 140* 156* 165*  PROT 6.4* 6.3* 6.5 6.7  ALBUMIN 2.4* 2.3* 2.3* 2.6*     Assessment and Plan: Patient with history of pancreatic cancer with malignant biliary obstruction, status post external biliary drain placement on 7/21 and conversion to internal and external biliary drain on 7/22.  Patient unable to undergo ERCP due to duodenal stenosis; now with persistent minimal oral intake.  Status post percutaneous gastrostomy placement 8/2-unsuccessful attempt at jejunostomy tube placement due to inability to cannulate the pylorus. No new labs today. Biliary drain irrigation;  once significant drop in bilirubin noted, could consider capping trial.   Electronically Signed: Ascencion Dike, PA-C 08/30/2020, 10:25 AM   I spent a total of 15 Minutes at the the patient's bedside AND on the patient's hospital floor or unit, greater than 50% of which was counseling/coordinating care for biliary drain and gastrostomy tube

## 2020-08-30 NOTE — Progress Notes (Signed)
Nutrition Follow-up  DOCUMENTATION CODES:   Non-severe (moderate) malnutrition in context of chronic illness  INTERVENTION:   -Continue Osmolite 1.5 @ 55 ml/hr via G-tube -45 ml Prosource TF TID, each provides 40 kcals and 11g protein -Free water flushes of 250 ml every 4 hours (1500 ml) -Provides 2100 kcals, 115g protein and 2505 ml H2O  TF recommendations for after discharge: -Isosource 1.5 @ 60 ml/hr via G-tube (6 cartons total) -Provides 2250 kcals, 102g protein and 1146 ml H2O   NUTRITION DIAGNOSIS:   Moderate Malnutrition related to chronic illness, cancer and cancer related treatments as evidenced by mild fat depletion, mild muscle depletion, moderate muscle depletion.  Ongoing.  GOAL:   Patient will meet greater than or equal to 90% of their needs  Meeting with TF  MONITOR:   TF tolerance, Diet advancement, PO intake, Labs, Weight trends  ASSESSMENT:   65 year old male with past medical history of hypertension and diabetes mellitus with recent diagnosis of pancreatic mass suspected to be pancreatic adenocarcinoma referred to Dr. Allison Quarry of gastroenterology for stent placement for obstruction and underwent ERCP on 7/19 with stent unable to be placed.  Patient was then directly admitted to the hospitalist service and seen by interventional radiology with plans for stent placement today.  In the interim, biopsy results positive for pancreatic adenocarcinoma.  7/19: s/p EGD 7/21: s/p biliary drain placement 7/27: NGT placed for TF initiation 7/31: NGT clogged 8/1: s/p G-tube placement  Spoke with representative from [place] regarding pt's tube feeding. Pt to discharge possibly tomorrow. TF will be switched from Osmolite formula to equivalent IsoSource 1.5 formula to better meet needs without Prosource supplement.  Goal was provided to RD.   Admission weight: 150 lbs. Current weight: 147 lbs.  Medications: Ferrous sulfate  Labs reviewed: Elevated Na CBGs:  93-206   Diet Order:   Diet Order             Diet clear liquid Room service appropriate? Yes; Fluid consistency: Thin  Diet effective now                   EDUCATION NEEDS:   Education needs have been addressed  Skin:  Skin Assessment: Reviewed RN Assessment  Last BM:  8/2 -type 5  Height:   Ht Readings from Last 1 Encounters:  08/14/20 '5\' 10"'$  (1.778 m)    Weight:   Wt Readings from Last 1 Encounters:  08/30/20 66.7 kg   BMI:  Body mass index is 21.1 kg/m.  Estimated Nutritional Needs:   Kcal:  2000-2200  Protein:  105-120g  Fluid:  2.2L/day   Clayton Bibles, MS, RD, LDN Inpatient Clinical Dietitian Contact information available via Amion

## 2020-08-30 NOTE — Progress Notes (Signed)
PROGRESS NOTE  Jeff Jordan [id number] DOB: May 31, 1955   PCP: Georganna Skeans, PA-C  Patient is from: Home.  Lives alone.  However, daughter lives close by.  DOA: 08/14/2020 LOS: 15  Chief complaints:  No chief complaint on file.    Brief Narrative / Interim history: 65 year old M with PMH of pancreatic adenocarcinoma, DM-2, HTN and FTT presenting for biliary obstruction due to pancreatic adenocarcinoma after he failed biliary stent placement by ERCP at GI office on 7/19 due to duodenal stenosis.  He had percutaneous drain placed on 7/21 that was converted to internal and external biliary drain on 7/22.  He underwent cholangiogram on 7/28.  Biliary fluid with Candida glabrata and staph epidermis.  Started on Diflucan and ceftriaxone on 7/29 which were since changed to Eraxis and IV Unasyn, and he completed 5 days course on 8/4.  LFT and bilirubin improving.  G-tube placed by IR on 8/1 after unsuccessful attempt for jejunostomy tube due to inability to cannulate the pylorus.  Tube feeds restarted and titrated to goal rate.  He is also on clear liquid diet.  Still with hypernatremia.  Increasing free water.  IR following, and planning capping trial of his biliary drain once his bilirubin improves.   Subjective: Seen and examined earlier this morning.  No major events overnight of this morning.  No complaints other than diarrhea.  He thinks he might have about 4 episodes of loose bowel movements overnight.  He denies further chest pain since yesterday morning.  Denies shortness of breath.  Denies nausea or vomiting.  Objective: Vitals:   08/29/20 2017 08/30/20 0443 08/30/20 0445 08/30/20 1433  BP: (!) 163/68 (!) 147/68  (!) 147/50  Pulse: (!) 108 96  (!) 106  Resp: '16 18  16  '$ Temp: 97.9 F (36.6 C) 98.5 F (36.9 C)  97.8 F (36.6 C)  TempSrc: Oral Oral  Oral  SpO2: 100% 100%  100%  Weight:   66.7 kg   Height:        Intake/Output Summary (Last 24 hours) at 08/30/2020 1645 Last data  filed at 08/30/2020 1548 Gross per 24 hour  Intake 2566.17 ml  Output 1301 ml  Net 1265.17 ml   Filed Weights   08/27/20 0500 08/29/20 0500 08/30/20 0445  Weight: 61.2 kg 65.2 kg 66.7 kg    Examination:  GENERAL: Frail and chronically ill-appearing.  Nontoxic.  No apparent distress. HEENT: MMM.  Sclera anicteric. NECK: Supple.  No apparent JVD.  RESP:  No IWOB.  Fair aeration bilaterally. CVS:  RRR. Heart sounds normal.  ABD/GI/GU: BS+. Abd soft, NTND.  RUQ biliary drain in place. MSK/EXT:  Moves extremities. No apparent deformity. No edema.  Significant muscle mass and subcu fat loss. SKIN: Jaundiced to discharge. NEURO: Awake and alert. Oriented appropriately.  No apparent focal neuro deficit. PSYCH: Calm. Normal affect.   Procedures:  -7/19-outpatient biliary stent placement unsuccessful -7/21-percutaneous drain placement -7/22-conversion of external biliary drain to internal and external biliary drain  -7/28-IR cholangiogram on 7/28 -8/1-G-tube placed by IR   Microbiology summarized: COVID-19 PCR nonreactive. Biliary culture with staph epidermis and Candida glabrata  Assessment & Plan: Biliary obstruction due to pancreatic adenocarcinoma Elevated liver enzymes/obstructive jaundice-LFT and bilirubin improved. Gastric outlet obstruction -Outpatient ERCP and biliary stent placement on 7/19 unsuccessful due to duodenal stenosis. -PERC biliary drain on 7/21, conversion to internal and external biliary drain on 7/22, IR cholangiogram on 7/28 -G-tube placed by IR on 8/1 after unsuccessful attempt for jejunostomy tube due to  inability to cannulate the pylorus.   -IR planning capping trial of his biliary drain once bilirubin improves.  We will clarify with IR if this can be done outpatient. -Continue to feed -Has now nausea or vomiting.  Started clear liquid diet.  Not sure if we can advance beyond this with his pyloric stenosis. -Has referral to oncology office in  [place] -Pain controlled with p.o. medications -Hold home Crestor. -Antibiotics as below.  Biliary infection/cholangitis in the setting of the above: Biliary fluid culture with Candida glabrata and staph epidermis.  Staph epidermis likely contaminant.  -Appreciate ID recs  -Continue Eraxis for 5 days, end date 8/4.  -IV Unasyn>> p.o. Augmentin for a total of 5 days, end date 8/4 -Follow sensitivities on Candida  Chest pain: Likely due to underlying malignancy.  EKG, troponin, BNP and x-ray reassuring.  Resolved.   Diarrhea: Likely due to tube feed.  Abdominal exam benign.  Not febrile.  Leukocytosis likely from cholangitis and improving. -Imodium per tube as needed.  Hypernatremia likely hypovolemic. Na 150, slightly improved. -Increase FW from 250 to 300 cc every 4 hours -Also started low-dose Aldactone -Avapro instead of home irbesartan -Encourage p.o. water.  He is on clear liquid diet. -Recheck in the morning  Controlled NIDDM-2 with hyperglycemia and hyperglycemia: On Jardiance, metformin and Actos at home.  A1c 5.3%. Recent Labs  Lab 08/29/20 2014 08/29/20 2355 08/30/20 0439 08/30/20 0800 08/30/20 1230  GLUCAP 167* 202* 206* 192* 179*  -Increase Levemir to 30 units twice daily -Continue SSI-moderate -Continue NovoLog 4 units every 4 hours for TF coverage -Continue holding home Crestor with elevated LFT -Patient may need insulin on discharge for tube feed coverage.  I do not think his p.o. meds are ideal in his situation.  Essential hypertension: BP slightly elevated. -Increase amlodipine to 10 mg daily. -Start Avapro instead of home irbesartan-at the lower dose -Continue low-dose Aldactone -Continue hydralazine IV as needed  Hypokalemia: K 3.7.  Resolved. -Recheck in the morning  Metabolic acidosis: Likely due to diarrhea -Manage diarrhea as above.   HLD: Continue hold Crestor in the setting of elevated LFT.   GERD: Protonix 40 mg IV daily   Thrush:  Nystatin swish and swallow.  He is also on Eraxis now.  Generalized weakness/debility -PT/OT eval  Leukocytosis/bandemia: Likely due to biliary infection.  Improving. -Continue monitoring  Adult failure to thrive/moderate malnutrition-poor p.o. intake in the setting of adenocarcinoma. Body mass index is 21.1 kg/m. Nutrition Problem: Moderate Malnutrition Etiology: chronic illness, cancer and cancer related treatments Signs/Symptoms: mild fat depletion, mild muscle depletion, moderate muscle depletion Interventions: Tube feeding   DVT prophylaxis:  SCDs Start: 08/14/20 1624  Code Status: Full code Family Communication: Patient and/or RN.  Updated patient's daughter over the phone. Level of care: Med-Surg Status is: Inpatient  Remains inpatient appropriate because:Ongoing diagnostic testing needed not appropriate for outpatient work up, IV treatments appropriate due to intensity of illness or inability to take PO, and Inpatient level of care appropriate due to severity of illness  Dispo: The patient is from: Home              Anticipated d/c is to: Home              Patient currently is not medically stable to d/c.   Difficult to place patient No       Consultants:  GI IR ID   Sch Meds:  Scheduled Meds:  amLODipine  10 mg Oral Daily   amoxicillin-clavulanate  875 mg Per Tube Q12H   cholestyramine light  4 g Oral BID   feeding supplement  1 Container Oral TID BM   feeding supplement (PROSource TF)  45 mL Per Tube TID   ferrous sulfate  325 mg Oral Q breakfast   free water  250 mL Per Tube Q4H   insulin aspart  0-15 Units Subcutaneous TID WC   insulin aspart  0-5 Units Subcutaneous QHS   insulin aspart  4 Units Subcutaneous Q4H   insulin detemir  30 Units Subcutaneous BID   irbesartan  150 mg Oral Daily   nystatin  5 mL Oral QID   pantoprazole  40 mg Intravenous Q1400   sodium chloride flush  10-40 mL Intracatheter Q12H   sodium chloride flush  5 mL  Intracatheter Q8H   sodium chloride flush  5 mL Intracatheter Q8H   spironolactone  12.5 mg Per Tube Daily   Continuous Infusions:  anidulafungin Stopped (08/30/20 1217)   feeding supplement (OSMOLITE 1.5 CAL) 1,000 mL (08/30/20 1231)   PRN Meds:.doxylamine (Sleep), labetalol, loperamide HCl, morphine injection, ondansetron (ZOFRAN) IV, oxyCODONE, sodium chloride flush  Antimicrobials: Anti-infectives (From admission, onward)    Start     Dose/Rate Route Frequency Ordered Stop   08/28/20 1300  amoxicillin-clavulanate (AUGMENTIN) 400-57 MG/5ML suspension 875 mg        875 mg Per Tube Every 12 hours 08/28/20 1152 08/31/20 0959   08/27/20 1500  vancomycin (VANCOCIN) IVPB 1000 mg/200 mL premix        1,000 mg 200 mL/hr over 60 Minutes Intravenous To Radiology 08/24/20 1644 08/27/20 1644   08/27/20 1000  anidulafungin (ERAXIS) 100 mg in sodium chloride 0.9 % 100 mL IVPB       See Hyperspace for full Linked Orders Report.   100 mg 78 mL/hr over 100 Minutes Intravenous Daily 08/26/20 1115 08/30/20 2359   08/26/20 1230  anidulafungin (ERAXIS) 200 mg in sodium chloride 0.9 % 200 mL IVPB       See Hyperspace for full Linked Orders Report.   200 mg 78 mL/hr over 200 Minutes Intravenous Once 08/26/20 1115 08/27/20 0008   08/26/20 1230  Ampicillin-Sulbactam (UNASYN) 3 g in sodium chloride 0.9 % 100 mL IVPB  Status:  Discontinued        3 g 200 mL/hr over 30 Minutes Intravenous Every 6 hours 08/26/20 1147 08/28/20 1152   08/24/20 1800  cefTRIAXone (ROCEPHIN) 2 g in sodium chloride 0.9 % 100 mL IVPB  Status:  Discontinued        2 g 200 mL/hr over 30 Minutes Intravenous Every 24 hours 08/24/20 1640 08/26/20 1147   08/24/20 1800  fluconazole (DIFLUCAN) IVPB 100 mg  Status:  Discontinued        100 mg 50 mL/hr over 60 Minutes Intravenous Every 24 hours 08/24/20 1640 08/26/20 1115   08/21/20 1100  metroNIDAZOLE (FLAGYL) tablet 500 mg  Status:  Discontinued        500 mg Oral Every 8 hours 08/21/20  1002 08/22/20 0744   08/19/20 1800  metroNIDAZOLE (FLAGYL) IVPB 500 mg  Status:  Discontinued        500 mg 100 mL/hr over 60 Minutes Intravenous Every 8 hours 08/19/20 1614 08/21/20 1002   08/17/20 0800  ciprofloxacin (CIPRO) tablet 500 mg        500 mg Oral 2 times daily 08/17/20 0411 08/21/20 2152   08/16/20 0600  cefOXitin (MEFOXIN) 2 g in sodium chloride 0.9 % 100 mL IVPB  2 g 200 mL/hr over 30 Minutes Intravenous To Radiology 08/14/20 1620 08/16/20 1704        I have personally reviewed the following labs and images: CBC: Recent Labs  Lab 08/26/20 0720 08/27/20 1015 08/28/20 0501 08/29/20 0947 08/30/20 1036  WBC 12.5* 13.4* 13.1* 13.2* 12.8*  NEUTROABS  --  10.5*  --   --   --   HGB 10.2* 10.3* 10.8* 11.1* 9.7*  HCT 30.1* 30.9* 32.3* 32.6* 29.5*  MCV 89.9 92.2 93.1 91.1 95.2  PLT 256 260 282 341 321   BMP &GFR Recent Labs  Lab 08/26/20 0720 08/27/20 1015 08/28/20 0501 08/29/20 0947 08/30/20 1036  NA 147* 148* 153* 152* 150*  K 3.1* 5.1 4.0 3.2* 3.7  CL 116* 117* 119* 122* 121*  CO2 '23 25 25 '$ 17* 22  GLUCOSE 103* 124* 240* 220* 126*  BUN '15 14 14 14 14  '$ CREATININE 0.58* 0.38* 0.50* 0.62 0.66  CALCIUM 9.0 8.9 9.0 9.2 9.2  MG 2.2 2.3 2.2 2.3 2.2  PHOS 2.8 3.2 3.1 2.4* 2.5   Estimated Creatinine Clearance: 85.7 mL/min (by C-G formula based on SCr of 0.66 mg/dL). Liver & Pancreas: Recent Labs  Lab 08/26/20 0720 08/27/20 1015 08/28/20 0501 08/29/20 0947 08/30/20 1036  AST 108* 164* 171* 169* 139*  ALT 91* 103* 123* 136* 127*  ALKPHOS 136* 140* 156* 165* 156*  BILITOT 11.5* 12.6* 12.9* 11.8* 9.8*  PROT 6.4* 6.3* 6.5 6.7 6.3*  ALBUMIN 2.4* 2.3* 2.3* 2.6* 2.2*   No results for input(s): LIPASE, AMYLASE in the last 168 hours. No results for input(s): AMMONIA in the last 168 hours. Diabetic: No results for input(s): HGBA1C in the last 72 hours.  Recent Labs  Lab 08/29/20 2014 08/29/20 2355 08/30/20 0439 08/30/20 0800 08/30/20 1230  GLUCAP  167* 202* 206* 192* 179*   Cardiac Enzymes: No results for input(s): CKTOTAL, CKMB, CKMBINDEX, TROPONINI in the last 168 hours. No results for input(s): PROBNP in the last 8760 hours. Coagulation Profile: Recent Labs  Lab 08/27/20 1015  INR 1.2   Thyroid Function Tests: No results for input(s): TSH, T4TOTAL, FREET4, T3FREE, THYROIDAB in the last 72 hours. Lipid Profile: No results for input(s): CHOL, HDL, LDLCALC, TRIG, CHOLHDL, LDLDIRECT in the last 72 hours. Anemia Panel: No results for input(s): VITAMINB12, FOLATE, FERRITIN, TIBC, IRON, RETICCTPCT in the last 72 hours. Urine analysis: No results found for: COLORURINE, APPEARANCEUR, LABSPEC, PHURINE, GLUCOSEU, HGBUR, BILIRUBINUR, KETONESUR, PROTEINUR, UROBILINOGEN, NITRITE, LEUKOCYTESUR Sepsis Labs: Invalid input(s): PROCALCITONIN, [place]  Microbiology: Recent Results (from the past 240 hour(s))  Body fluid culture w Gram Stain     Status: None (Preliminary result)   Collection Time: 08/22/20 11:12 AM   Specimen: BILE; Body Fluid  Result Value Ref Range Status   Specimen Description   Final    BILE Performed at [facility] [address], [place], [place] [address]    Special Requests   Final    Normal Performed at [facility], [place] [address], [place], [place] [address]    Gram Stain   Final    NO WBC SEEN MODERATE YEAST FEW GRAM POSITIVE COCCI    Culture   Final    MODERATE STAPHYLOCOCCUS EPIDERMIDIS MODERATE CANDIDA GLABRATA Sent to [place] for further susceptibility testing. Performed at [facility], [place] [address] [place], [place] [address]    Report Status PENDING  Incomplete   Organism ID, Bacteria STAPHYLOCOCCUS EPIDERMIDIS  Final  Susceptibility   Staphylococcus epidermidis - MIC*    CIPROFLOXACIN >=8 RESISTANT Resistant     ERYTHROMYCIN >=8 RESISTANT Resistant     GENTAMICIN <=0.5 SENSITIVE Sensitive     OXACILLIN >=4 RESISTANT Resistant      TETRACYCLINE >=16 RESISTANT Resistant     VANCOMYCIN 1 SENSITIVE Sensitive     TRIMETH/SULFA <=10 SENSITIVE Sensitive     CLINDAMYCIN >=8 RESISTANT Resistant     RIFAMPIN <=0.5 SENSITIVE Sensitive     Inducible Clindamycin NEGATIVE Sensitive     * MODERATE STAPHYLOCOCCUS EPIDERMIDIS  Antifungal AST 9 Drug Panel     Status: None   Collection Time: 08/22/20 11:12 AM  Result Value Ref Range Status   Organism ID, Yeast Candida glabrata  Corrected    Comment: (NOTE) Identification performed by account, not confirmed by this laboratory. CORRECTED ON 08/04 AT 1436: PREVIOUSLY REPORTED AS Preliminary report    Amphotericin B MIC 0.5 ug/mL  Final    Comment: (NOTE) *Breakpoints have been established for only some organism-drug combinations as indicated. Results of this test are labeled for research purposes only by the assay's manufacturer. The performance characteristics of this assay have not been established by the manufacturer. The result should not be used for treatment or for diagnostic purposes without confirmation of the diagnosis by another medically established diagnostic product or procedure. The performance characteristics were determined by [facility].    Anidulafungin MIC Comment  Final    Comment: (NOTE) 0.03 ug/mL Susceptible *Breakpoints have been established for only some organism-drug combinations as indicated. Results of this test are labeled for research purposes only by the assay's manufacturer. The performance characteristics of this assay have not been established by the manufacturer. The result should not be used for treatment or for diagnostic purposes without confirmation of the diagnosis by another medically established diagnostic product or procedure. The performance characteristics were determined by [facility].    Caspofungin MIC Comment  Final    Comment: 0.06 ug/mL Susceptible   Micafungin MIC Comment  Final    Comment: 0.016 ug/mL Susceptible    Posaconazole MIC 2.0 ug/mL  Final    Comment: (NOTE) *Breakpoints have been established for only some organism-drug combinations as indicated. Results of this test are labeled for research purposes only by the assay's manufacturer. The performance characteristics of this assay have not been established by the manufacturer. The result should not be used for treatment or for diagnostic purposes without confirmation of the diagnosis by another medically established diagnostic product or procedure. The performance characteristics were determined by [facility].    Fluconazole Islt MIC 16.0 ug/mL  Final    Comment: Susceptible Dose Dependent   Flucytosine MIC 0.06 ug/mL or less  Final   Itraconazole MIC 1.0 ug/mL  Final   Voriconazole MIC 0.25 ug/mL  Final    Comment: (NOTE) Performed At: [facility] [place], [place] [id number] Rush Farmer MD [id number]    Source [id number]  Final    Comment: Performed at [facility], [place] [address] [place], [place] [address]  Culture, blood (routine x 2)     Status: None (Preliminary result)   Collection Time: 08/26/20  2:11 PM   Specimen: BLOOD  Result Value Ref Range Status   Specimen Description   Final    BLOOD BLOOD RIGHT HAND Performed at [facility] [address] [place], [place] [address]    Special Requests   Final    BOTTLES DRAWN AEROBIC ONLY Blood Culture  adequate volume Performed at [facility] [address] [place], [place] [address]    Culture   Final    NO GROWTH 4 DAYS Performed at [facility], [place] [address], [place], [place] [address]    Report Status PENDING  Incomplete    Radiology Studies: No results found.    Callaway Hardigree T. Foscoe  If 7PM-7AM, please contact night-coverage [email or web address] 08/30/2020, 4:45 PM

## 2020-08-31 DIAGNOSIS — R112 Nausea with vomiting, unspecified: Secondary | ICD-10-CM | POA: Diagnosis not present

## 2020-08-31 DIAGNOSIS — C259 Malignant neoplasm of pancreas, unspecified: Secondary | ICD-10-CM | POA: Diagnosis not present

## 2020-08-31 DIAGNOSIS — E440 Moderate protein-calorie malnutrition: Secondary | ICD-10-CM | POA: Diagnosis not present

## 2020-08-31 DIAGNOSIS — K831 Obstruction of bile duct: Secondary | ICD-10-CM | POA: Diagnosis not present

## 2020-08-31 LAB — COMPREHENSIVE METABOLIC PANEL
ALT: 122 U/L — ABNORMAL HIGH (ref 0–44)
AST: 128 U/L — ABNORMAL HIGH (ref 15–41)
Albumin: 2.3 g/dL — ABNORMAL LOW (ref 3.5–5.0)
Alkaline Phosphatase: 151 U/L — ABNORMAL HIGH (ref 38–126)
Anion gap: 7 (ref 5–15)
BUN: 13 mg/dL (ref 8–23)
CO2: 20 mmol/L — ABNORMAL LOW (ref 22–32)
Calcium: 8.9 mg/dL (ref 8.9–10.3)
Chloride: 120 mmol/L — ABNORMAL HIGH (ref 98–111)
Creatinine, Ser: 0.6 mg/dL — ABNORMAL LOW (ref 0.61–1.24)
GFR, Estimated: >60 mL/min (ref >60)
Glucose, Bld: 207 mg/dL — ABNORMAL HIGH (ref 70–99)
Potassium: 3.8 mmol/L (ref 3.5–5.1)
Sodium: 147 mmol/L — ABNORMAL HIGH (ref 135–145)
Total Bilirubin: 9.4 mg/dL — ABNORMAL HIGH (ref 0.3–1.2)
Total Protein: 6 g/dL — ABNORMAL LOW (ref 6.5–8.1)

## 2020-08-31 LAB — BODY FLUID CULTURE W GRAM STAIN
Gram Stain: NONE SEEN
Special Requests: NORMAL

## 2020-08-31 LAB — GLUCOSE, CAPILLARY
Glucose-Capillary: 148 mg/dL — ABNORMAL HIGH (ref 70–99)
Glucose-Capillary: 173 mg/dL — ABNORMAL HIGH (ref 70–99)
Glucose-Capillary: 185 mg/dL — ABNORMAL HIGH (ref 70–99)
Glucose-Capillary: 186 mg/dL — ABNORMAL HIGH (ref 70–99)
Glucose-Capillary: 227 mg/dL — ABNORMAL HIGH (ref 70–99)
Glucose-Capillary: 262 mg/dL — ABNORMAL HIGH (ref 70–99)

## 2020-08-31 LAB — CULTURE, BLOOD (ROUTINE X 2)
Culture: NO GROWTH
Special Requests: ADEQUATE

## 2020-08-31 LAB — CBC
HCT: 29.3 % — ABNORMAL LOW (ref 39.0–52.0)
Hemoglobin: 9.7 g/dL — ABNORMAL LOW (ref 13.0–17.0)
MCH: 31.3 pg (ref 26.0–34.0)
MCHC: 33.1 g/dL (ref 30.0–36.0)
MCV: 94.5 fL (ref 80.0–100.0)
Platelets: 311 10*3/uL (ref 150–400)
RBC: 3.1 MIL/uL — ABNORMAL LOW (ref 4.22–5.81)
RDW: 23.6 % — ABNORMAL HIGH (ref 11.5–15.5)
WBC: 13.7 10*3/uL — ABNORMAL HIGH (ref 4.0–10.5)
nRBC: 0 % (ref 0.0–0.2)

## 2020-08-31 LAB — PHOSPHORUS: Phosphorus: 2.7 mg/dL (ref 2.5–4.6)

## 2020-08-31 LAB — MAGNESIUM: Magnesium: 2.2 mg/dL (ref 1.7–2.4)

## 2020-08-31 NOTE — Progress Notes (Signed)
I received a verbal referral from nurse tech that this patient might benefit from support.  Provided spiritual support and prayer at pt's request.  He is visited by his daughter, who also lives close to him when he is discharged, as well as supported by his church.  They had brought him a recording of his pastor's sermon and he was listening to it.  [place], [place] Pager, [phone] 11:21 AM

## 2020-08-31 NOTE — Progress Notes (Signed)
Patient ID: Jeff Jordan, male   DOB: Feb 06, 1955, 65 y.o.   MRN: [id number]    Referring Physician(s): Mansouraty,G/Gonfa,T  Supervising Physician: Markus Daft  Patient Status:  [facility] - In-pt  Chief Complaint: Pancreatic cancer with malignant obstructive jaundice, intermittent nausea /vomiting, malnutrition, poor oral intake, duodenal stenosis   Subjective: Patient on portable commode; denies worsening abdominal pain, nausea, vomiting; tube feeds via gastrostomy tube continue   Allergies: Patient has no known allergies.  Medications: Prior to Admission medications   Medication Sig Start Date End Date Taking? Authorizing Provider  amLODipine (NORVASC) 5 MG tablet Take 5 mg by mouth daily. 06/19/20  Yes [provider]  aspirin 81 MG EC tablet Take 81 mg by mouth daily.   Yes [provider]  ferrous sulfate 325 (65 FE) MG tablet Take 325 mg by mouth daily with breakfast.   Yes [provider]  JARDIANCE 25 MG TABS tablet Take 25 mg by mouth daily. 07/23/20  Yes [provider]  loperamide (IMODIUM) 2 MG capsule Take by mouth as needed for diarrhea or loose stools.   Yes [provider]  metFORMIN (GLUCOPHAGE) 1000 MG tablet Take 1,000 mg by mouth 2 (two) times daily. 07/16/20  Yes [provider]  olmesartan (BENICAR) 40 MG tablet Take 40 mg by mouth daily. 06/25/20  Yes [provider]  omeprazole (PRILOSEC) 40 MG capsule Take 40 mg by mouth every morning. 03/01/20  Yes [provider]  ondansetron (ZOFRAN) 4 MG tablet Take 1 tablet (4 mg total) by mouth every 4 (four) hours as needed for nausea. 08/02/20  Yes Dayton Scrape A, NP  Oxycodone HCl 10 MG TABS Take 1 tablet (10 mg total) by mouth every 4 (four) hours as needed. Patient taking differently: Take 10 mg by mouth every 4 (four) hours as needed (pain). 08/02/20  Yes [place] Scrape A, NP  pioglitazone (ACTOS) 45 MG tablet Take 45 mg by mouth daily. 06/25/20  Yes  [provider]  rosuvastatin (CRESTOR) 5 MG tablet Take 5 mg by mouth once a week. 06/25/20  Yes [provider]     Vital Signs: BP (!) 148/63 (BP Location: Left Arm)   Pulse (!) 108   Temp 98 F (36.7 C) (Oral)   Resp 14   Ht '5\' 10"'$  (1.778 m)   Wt 147 lb 0.8 oz (66.7 kg)   SpO2 100%   BMI 21.10 kg/m   Physical Exam awake, alert.  Right biliary drain intact, insertion site okay, no leaking, output 700 cc yesterday, 500 cc today green bile, drain irrigated without difficulty, gastrostomy tube intact, insertion site okay  Imaging: DG Chest Port 1 View  Result Date: 08/29/2020 CLINICAL DATA:  Central chest pain EXAM: PORTABLE CHEST 1 VIEW COMPARISON:  None. FINDINGS: The heart size and mediastinal contours are within normal limits. Low lung volumes with mild bibasilar atelectasis. Lungs are otherwise clear. No pleural effusion or pneumothorax. Partially visualized catheter tubing overlies the right hemiabdomen. The visualized skeletal structures are unremarkable. IMPRESSION: Low lung volumes with mild bibasilar atelectasis. Electronically Signed   By: Davina Poke D.O.   On: 08/29/2020 12:26    Labs:  CBC: Recent Labs    08/28/20 0501 08/29/20 0947 08/30/20 1036 08/31/20 0504  WBC 13.1* 13.2* 12.8* 13.7*  HGB 10.8* 11.1* 9.7* 9.7*  HCT 32.3* 32.6* 29.5* 29.3*  PLT 282 341 321 311    COAGS: Recent Labs    08/14/20 1934 08/15/20 0452 08/16/20 0553 08/27/20 1015  INR 1.7* 1.5* 1.1 1.2    BMP: Recent Labs    08/28/20 0501 08/29/20 0947 08/30/20 1036 08/31/20 0504  NA 153* 152* 150* 147*  K 4.0 3.2* 3.7 3.8  CL 119* 122* 121* 120*  CO2 25 17* 22 20*  GLUCOSE 240* 220* 126* 207*  BUN '14 14 14 13  '$ CALCIUM 9.0 9.2 9.2 8.9  CREATININE 0.50* 0.62 0.66 0.60*  GFRNONAA >60 >60 >60 >60    LIVER FUNCTION TESTS: Recent Labs    08/28/20 0501 08/29/20 0947 08/30/20 1036 08/31/20 0504  BILITOT 12.9* 11.8* 9.8* 9.4*  AST 171* 169* 139* 128*   ALT 123* 136* 127* 122*  ALKPHOS 156* 165* 156* 151*  PROT 6.5 6.7 6.3* 6.0*  ALBUMIN 2.3* 2.6* 2.2* 2.3*    Assessment and Plan: Patient with history of pancreatic cancer with malignant biliary obstruction, status post external biliary drain placement on 7/21 and conversion to internal and external biliary drain on 7/22.  Patient unable to undergo ERCP due to duodenal stenosis; now with persistent minimal oral intake; status post percutaneous gastrostomy placement yesterday-unsuccessful attempt at jejunostomy tube placement due to inability to cannulate the pylorus ; afebrile,  WBC 13.7 up from 12.8, hemoglobin 9.7 and stable, creatinine 0.6, total bilirubin 9.4 down from 9.8; case reviewed with Dr. Anselm Pancoast; will attempt capping trial today of biliary drain however with recent increase in output high chance patient may be placed back to bag drainage; nurse notified and extra gravity bag given to patient in case leaking occurs after capping today.;  Nurse instructed to place drain back to gravity bag should patient develop increasing abdominal pain or increasing leaking from insertion site. TRH aware of plans.       Electronically Signed: D. Rowe Robert, PA-C 08/31/2020, 4:27 PM   I spent a total of 15 Minutes at the the patient's bedside AND on the patient's hospital floor or unit, greater than 50% of which was counseling/coordinating care for biliary drain and gastrostomy tube

## 2020-08-31 NOTE — Progress Notes (Signed)
Physical Therapy Treatment Patient Details Name: Jeff Jordan MRN: [id number] DOB: Nov 11, 1955 Today's Date: 08/31/2020    History of Present Illness 65 year old M with PMH of pancreatic adenocarcinoma, DM-2, HTN and FTT presenting for biliary obstruction due to pancreatic adenocarcinoma after he failed biliary stent placement by ERCP at GI office on 7/19.  He had percutaneous drain placed on 7/21 that was converted to internal and external biliary drain on 7/22.  He underwent cholangiogram on 7/28.  Biliary fluid with Candida glabrata and staph epidermis.  Started on Diflucan and ceftriaxone on 7/29 which were since changed to Eraxis and IV Unasyn per ID recommendation.  LFT and bilirubin improving.  G-tube placed by IR on 8/1.  Tube feeds restarted.    PT Comments    General Comments: AxO x 3 pleasant and always willing but having increased ABD pain during today's session. General bed mobility comments: Supervision for safety/ drain management plus increased time OOB then Min Assist to support B LE back onto bed due to ABD pain.    Follow Up Recommendations  Home health PT     Equipment Recommendations  Rolling walker with 5" wheels;3in1 (PT)    Recommendations for Other Services       Precautions / Restrictions Precautions Precautions: Fall Precaution Comments: bilary drain on R, G tube, abdominal binder    Mobility  Bed Mobility Overal bed mobility: Needs Assistance Bed Mobility: Supine to Sit;Sit to Supine     Supine to sit: Supervision;HOB elevated Sit to supine: Min assist   General bed mobility comments: Supervision for safety/ drain management plus increased time OOB then Min Assist to support B LE back onto bed due to ABD pain    Transfers Overall transfer level: Needs assistance Equipment used: Rolling walker (2 wheeled) Transfers: Sit to/from [facility] Sit to Stand: Min guard Stand pivot transfers: Min guard       General transfer  comment: increased time and one VC to avoid pulling up on walker  Ambulation/Gait Ambulation/Gait assistance: Supervision;Min guard Gait Distance (Feet): 45 Feet Assistive device: Rolling walker (2 wheeled) Gait Pattern/deviations: Step-through pattern;Decreased stride length;Trunk flexed;Shuffle Gait velocity: decreased   General Gait Details: slow but steady  Slight forward flex posture due to ABD discomfort. decreased amb distance this session due to increased ABD pain.  RN in hallway at time and aware.   Stairs             Wheelchair Mobility    Modified Rankin (Stroke Patients Only)       Balance                                            Cognition Arousal/Alertness: Awake/alert Behavior During Therapy: WFL for tasks assessed/performed Overall Cognitive Status: Within Functional Limits for tasks assessed                                 General Comments: AxO x 3 pleasant and always willing but having increased ABD pain during today's session.      Exercises      General Comments        Pertinent Vitals/Pain Pain Assessment: Faces Faces Pain Scale: Hurts whole lot Pain Location: abdomen "Right side" Pain Descriptors / Indicators: Aching;Discomfort;Grimacing;Guarding Pain Intervention(s): Monitored during session;Repositioned    Home Living  Prior Function            PT Goals (current goals can now be found in the care plan section) Progress towards PT goals: Progressing toward goals    Frequency    Min 3X/week      PT Plan Current plan remains appropriate    Co-evaluation              AM-PAC PT "6 Clicks" Mobility   Outcome Measure  Help needed turning from your back to your side while in a flat bed without using bedrails?: A Little Help needed moving from lying on your back to sitting on the side of a flat bed without using bedrails?: A Little Help needed moving to  and from a bed to a chair (including a wheelchair)?: A Little Help needed standing up from a chair using your arms (e.g., wheelchair or bedside chair)?: A Little Help needed to walk in hospital room?: A Little Help needed climbing 3-5 steps with a railing? : A Little 6 Click Score: 18    End of Session Equipment Utilized During Treatment: Gait belt Activity Tolerance: Patient limited by pain Patient left: in bed;with call bell/phone within reach;with bed alarm set Nurse Communication: Mobility status PT Visit Diagnosis: Difficulty in walking, not elsewhere classified (R26.2)     Time: 1435-1500 PT Time Calculation (min) (ACUTE ONLY): 25 min  Charges:  $Gait Training: 8-22 mins $Therapeutic Activity: 8-22 mins                    Rica Koyanagi  PTA Acute  Rehabilitation Services Pager      [phone] Office      [phone]

## 2020-08-31 NOTE — Progress Notes (Signed)
Occupational Therapy Treatment Patient Details Name: Jeff Jordan MRN: [id number] DOB: [birth year] Today's Date: 08/31/2020    History of present illness 65 year old M with PMH of pancreatic adenocarcinoma, DM-2, HTN and FTT presenting for biliary obstruction due to pancreatic adenocarcinoma after he failed biliary stent placement by ERCP at GI office on 7/19.  He had percutaneous drain placed on 7/21 that was converted to internal and external biliary drain on 7/22.  He underwent cholangiogram on 7/28.  Biliary fluid with Candida glabrata and staph epidermis.  Started on Diflucan and ceftriaxone on 7/29 which were since changed to Eraxis and IV Unasyn per ID recommendation.  LFT and bilirubin improving.  G-tube placed by IR on 8/1.  Tube feeds restarted.   OT comments  Treatment focused on self care participation. Today patient limited by right side pain requiring assistance to don socks, min guard for transfers and increased time to perform all tasks. Patient able to perform toileting without physical assistance.    Follow Up Recommendations  Home health OT    Equipment Recommendations  None recommended by OT    Recommendations for Other Services      Precautions / Restrictions Precautions Precaution Comments: bilary drain on R, G tube, abdominal binder Restrictions Weight Bearing Restrictions: No       Mobility Bed Mobility Overal bed mobility: Needs Assistance Bed Mobility: Supine to Sit     Supine to sit: Supervision;HOB elevated     General bed mobility comments: Supervision for safety/ drain management plus increased time    Transfers Overall transfer level: Needs assistance Equipment used: Rolling walker (2 wheeled) Transfers: Sit to/from [facility] Sit to Stand: Min guard Stand pivot transfers: Min guard            Balance Overall balance assessment: Needs assistance Sitting-balance support: Feet supported Sitting balance-Leahy Scale:  Good     Standing balance support: During functional activity Standing balance-Leahy Scale: Fair                             ADL either performed or assessed with clinical judgement   ADL Overall ADL's : Needs assistance/impaired     Grooming: Wash/dry hands;Sitting;Set up Grooming Details (indicate cue type and reason): washed hands sitting in recliner with setup             Lower Body Dressing: Moderate assistance;Sit to/from stand Lower Body Dressing Details (indicate cue type and reason): needed assistance to don socks today - limited by r side pain. Toilet Transfer: Min Statistician Details (indicate cue type and reason): min guard only Toileting- Water quality scientist and Hygiene: Min guard;Sit to/from stand Toileting - Clothing Manipulation Details (indicate cue type and reason): min guard only     Functional mobility during ADLs: Min guard;Rolling walker       Vision   Vision Assessment?: No apparent visual deficits   Perception     Praxis      Cognition Arousal/Alertness: Awake/alert Behavior During Therapy: WFL for tasks assessed/performed Overall Cognitive Status: Within Functional Limits for tasks assessed                                          Exercises     Shoulder Instructions       General Comments      Pertinent Vitals/ Pain  Pain Assessment: Faces Faces Pain Scale: Hurts even more Pain Location: abdomen "Right side" Pain Descriptors / Indicators: Aching;Discomfort;Grimacing;Guarding Pain Intervention(s): Limited activity within patient's tolerance;Monitored during session  Home Living                                          Prior Functioning/Environment              Frequency  Min 2X/week        Progress Toward Goals  OT Goals(current goals can now be found in the care plan section)  Progress towards OT goals: Progressing toward  goals  Acute Rehab OT Goals Patient Stated Goal: Regain IND OT Goal Formulation: With patient Time For Goal Achievement: 09/03/20 Potential to Achieve Goals: Good  Plan Discharge plan remains appropriate    Co-evaluation                 AM-PAC OT "6 Clicks" Daily Activity     Outcome Measure   Help from another person eating meals?: None Help from another person taking care of personal grooming?: A Little Help from another person toileting, which includes using toliet, bedpan, or urinal?: A Little Help from another person bathing (including washing, rinsing, drying)?: A Little Help from another person to put on and taking off regular upper body clothing?: A Little Help from another person to put on and taking off regular lower body clothing?: A Lot 6 Click Score: 18    End of Session Equipment Utilized During Treatment: Rolling walker  OT Visit Diagnosis: Muscle weakness (generalized) (M62.81);Unsteadiness on feet (R26.81)   Activity Tolerance Patient tolerated treatment well   Patient Left in chair;with call bell/phone within reach;with chair alarm set   Nurse Communication  (okay to see)        Time: [id number] OT Time Calculation (min): 15 min  Charges: OT General Charges $OT Visit: 1 Visit OT Treatments $Self Care/Home Management : 8-22 mins  Derl Barrow, OTR/L [place]  Office [phone] Pager: [place] 08/31/2020, 9:36 AM

## 2020-08-31 NOTE — Progress Notes (Signed)
PROGRESS NOTE  Jeff Jordan [id number] DOB: 1955-04-01   PCP: Georganna Skeans, PA-C  Patient is from: Home.  Lives alone.  However, daughter lives close by.  DOA: 08/14/2020 LOS: 16  Chief complaints:  No chief complaint on file.    Brief Narrative / Interim history: 65 year old M with PMH of pancreatic adenocarcinoma, DM-2, HTN and FTT presenting for biliary obstruction due to pancreatic adenocarcinoma after he failed biliary stent placement by ERCP at GI office on 7/19 due to duodenal stenosis.  He had percutaneous drain placed on 7/21 that was converted to internal and external biliary drain on 7/22.  He underwent cholangiogram on 7/28.  Biliary fluid with Candida glabrata and staph epidermis.  Started on Diflucan and ceftriaxone on 7/29 which were since changed to Eraxis and IV Unasyn, and he completed 5 days course on 8/4.  LFT and bilirubin improving.  G-tube placed by IR on 8/1 after unsuccessful attempt for jejunostomy tube due to inability to cannulate the pylorus.  Tube feeds restarted and titrated to goal rate.  He is also on clear liquid diet.  Still with hypernatremia.  Increasing free water.  IR following, and planning capping trial of his biliary drain, and watching over the weekend.   Subjective: Seen and examined earlier this morning.  No major events overnight of this morning.  Continues to have diarrhea.  Diarrhea is watery.  Had some Imodium yesterday last night but no improvement.  No abdominal pain other than his pain around the percutaneous drain.  He denies chest pain or dyspnea.  Objective: Vitals:   08/30/20 1433 08/30/20 2020 08/31/20 0443 08/31/20 1348  BP: (!) 147/50 (!) 151/57 139/63 (!) 148/63  Pulse: (!) 106 (!) 109 (!) 102 (!) 108  Resp: '16 18 18 14  '$ Temp: 97.8 F (36.6 C) 98.9 F (37.2 C) 98.2 F (36.8 C) 98 F (36.7 C)  TempSrc: Oral Oral Oral Oral  SpO2: 100% 100% 99% 100%  Weight:      Height:        Intake/Output Summary (Last 24 hours) at  08/31/2020 1551 Last data filed at 08/31/2020 1403 Gross per 24 hour  Intake 120 ml  Output 1314 ml  Net -1194 ml   Filed Weights   08/27/20 0500 08/29/20 0500 08/30/20 0445  Weight: 61.2 kg 65.2 kg 66.7 kg    Examination:  GENERAL: Frail and chronically ill-appearing.  Nontoxic. HEENT: MMM.  Sclerae icteric. NECK: Supple.  No apparent JVD.  RESP: On RA.  No IWOB.  Fair aeration bilaterally. CVS:  RRR. Heart sounds normal.  ABD/GI/GU: BS+. Abd soft, NTND.  RUQ biliary drain in place. MSK/EXT:  Moves extremities. No apparent deformity. No edema.  Significant muscle mass and subcu fat loss. SKIN: Jaundiced to his chest. NEURO: Awake and alert. Oriented appropriately.  No apparent focal neuro deficit. PSYCH: Calm. Normal affect.   Procedures:  -7/19-outpatient biliary stent placement unsuccessful -7/21-percutaneous drain placement -7/22-conversion of external biliary drain to internal and external biliary drain  -7/28-IR cholangiogram on 7/28 -8/1-G-tube placed by IR   Microbiology summarized: COVID-19 PCR nonreactive. Biliary culture with staph epidermis and Candida glabrata  Assessment & Plan: Biliary obstruction due to pancreatic adenocarcinoma Elevated liver enzymes/obstructive jaundice-LFT and bilirubin improved. Gastric outlet obstruction -Outpatient ERCP and biliary stent placement on 7/19 unsuccessful due to duodenal stenosis. -PERC biliary drain on 7/21, conversion to internal and external biliary drain on 7/22, IR cholangiogram on 7/28 -G-tube placed by IR on 8/1 after unsuccessful attempt for jejunostomy  tube due to inability to cannulate the pylorus.   -IR planning capping trial today and monitor over the weekend -Continue continuous tube feed -Tolerating clear liquid diet as well. Not sure if we can advance beyond this with his pyloric stenosis. -Has referral to oncology office in [place] -Pain controlled with p.o. medications -Hold home Crestor. -Antibiotics  as below.  Biliary infection/cholangitis in the setting of the above: Biliary fluid culture with Candida glabrata and staph epidermis.  Staph epidermis likely contaminant.  -Appreciate ID recs  -Completed 5 days of Eraxis on 8/4  -IV Unasyn>> p.o. Augmentin for a total of 5 days through 8/4   Chest pain: Likely due to underlying malignancy.  EKG, troponin, BNP and x-ray reassuring.  Resolved.   Diarrhea: Likely due to tube feed.  Abdominal exam benign.  Not febrile.  Leukocytosis likely from cholangitis.  Encouraged him to ask for Imodium more frequently. -Imodium per tube as needed.  Hypernatremia likely hypovolemic. Na 150>> >147.  Improving. -Increased FW from 250 to 300 cc every 4 hours on 8/4 -Continue low-dose Aldactone -Avapro instead of home irbesartan -Encourage p.o. water.  He is on clear liquid diet. -Recheck in the morning  Controlled NIDDM-2 with hyperglycemia and hyperglycemia: On Jardiance, metformin and Actos at home.  A1c 5.3%. Recent Labs  Lab 08/30/20 2016 08/31/20 0002 08/31/20 0400 08/31/20 0750 08/31/20 1229  GLUCAP 156* 186* 173* 148* 262*  -Increase Levemir from 30 to 32 units twice daily -Continue SSI-moderate -Increase NovoLog from 4-5 units every 4 hours for TF coverage -Continue holding home Crestor with elevated LFT -Patient may need insulin on discharge for tube feed coverage.  I do not think his p.o. meds are ideal in his situation.  Essential hypertension: BP slightly elevated. -Increase amlodipine to 10 mg daily. -Start Avapro instead of home irbesartan-at the lower dose -Continue low-dose Aldactone -Continue hydralazine IV as needed  Hypokalemia: K 3.8.  Resolved. -Recheck in the morning  Metabolic acidosis: Likely due to diarrhea.  Improving -Manage diarrhea as above.   HLD: Continue hold Crestor in the setting of elevated LFT.   GERD: Protonix 40 mg IV daily   Thrush: Nystatin swish and swallow.  He is also on Eraxis  now.  Generalized weakness/debility -PT/OT eval  Leukocytosis/bandemia: Likely due to biliary infection.  Improving. -Continue monitoring  Adult failure to thrive/moderate malnutrition-poor p.o. intake in the setting of adenocarcinoma. Body mass index is 21.1 kg/m. Nutrition Problem: Moderate Malnutrition Etiology: chronic illness, cancer and cancer related treatments Signs/Symptoms: mild fat depletion, mild muscle depletion, moderate muscle depletion Interventions: Tube feeding   DVT prophylaxis:  SCDs Start: 08/14/20 1624  Code Status: Full code Family Communication: Patient and/or RN.  Updated patient's daughter over the phone on 8/4. Level of care: Med-Surg Status is: Inpatient  Remains inpatient appropriate because:Ongoing diagnostic testing needed not appropriate for outpatient work up, IV treatments appropriate due to intensity of illness or inability to take PO, and Inpatient level of care appropriate due to severity of illness  Dispo: The patient is from: Home              Anticipated d/c is to: Home              Patient currently is not medically stable to d/c.   Difficult to place patient No       Consultants:  GI IR ID   Sch Meds:  Scheduled Meds:  amLODipine  10 mg Oral Daily   cholestyramine light  4 g  Oral BID   feeding supplement  1 Container Oral TID BM   feeding supplement (PROSource TF)  45 mL Per Tube TID   ferrous sulfate  325 mg Oral Q breakfast   free water  300 mL Per Tube Q4H   insulin aspart  0-15 Units Subcutaneous TID WC   insulin aspart  0-5 Units Subcutaneous QHS   insulin aspart  4 Units Subcutaneous Q4H   insulin detemir  30 Units Subcutaneous BID   irbesartan  150 mg Oral Daily   nystatin  5 mL Oral QID   pantoprazole  40 mg Intravenous Q1400   sodium chloride flush  10-40 mL Intracatheter Q12H   sodium chloride flush  5 mL Intracatheter Q8H   sodium chloride flush  5 mL Intracatheter Q8H   spironolactone  12.5 mg Per Tube  Daily   Continuous Infusions:  feeding supplement (OSMOLITE 1.5 CAL) 1,000 mL (08/31/20 1016)   PRN Meds:.doxylamine (Sleep), labetalol, loperamide HCl, morphine injection, ondansetron (ZOFRAN) IV, oxyCODONE, sodium chloride flush  Antimicrobials: Anti-infectives (From admission, onward)    Start     Dose/Rate Route Frequency Ordered Stop   08/28/20 1300  amoxicillin-clavulanate (AUGMENTIN) 400-57 MG/5ML suspension 875 mg        875 mg Per Tube Every 12 hours 08/28/20 1152 08/30/20 2143   08/27/20 1500  vancomycin (VANCOCIN) IVPB 1000 mg/200 mL premix        1,000 mg 200 mL/hr over 60 Minutes Intravenous To Radiology 08/24/20 1644 08/27/20 1644   08/27/20 1000  anidulafungin (ERAXIS) 100 mg in sodium chloride 0.9 % 100 mL IVPB       See Hyperspace for full Linked Orders Report.   100 mg 78 mL/hr over 100 Minutes Intravenous Daily 08/26/20 1115 08/30/20 2359   08/26/20 1230  anidulafungin (ERAXIS) 200 mg in sodium chloride 0.9 % 200 mL IVPB       See Hyperspace for full Linked Orders Report.   200 mg 78 mL/hr over 200 Minutes Intravenous Once 08/26/20 1115 08/27/20 0008   08/26/20 1230  Ampicillin-Sulbactam (UNASYN) 3 g in sodium chloride 0.9 % 100 mL IVPB  Status:  Discontinued        3 g 200 mL/hr over 30 Minutes Intravenous Every 6 hours 08/26/20 1147 08/28/20 1152   08/24/20 1800  cefTRIAXone (ROCEPHIN) 2 g in sodium chloride 0.9 % 100 mL IVPB  Status:  Discontinued        2 g 200 mL/hr over 30 Minutes Intravenous Every 24 hours 08/24/20 1640 08/26/20 1147   08/24/20 1800  fluconazole (DIFLUCAN) IVPB 100 mg  Status:  Discontinued        100 mg 50 mL/hr over 60 Minutes Intravenous Every 24 hours 08/24/20 1640 08/26/20 1115   08/21/20 1100  metroNIDAZOLE (FLAGYL) tablet 500 mg  Status:  Discontinued        500 mg Oral Every 8 hours 08/21/20 1002 08/22/20 0744   08/19/20 1800  metroNIDAZOLE (FLAGYL) IVPB 500 mg  Status:  Discontinued        500 mg 100 mL/hr over 60 Minutes  Intravenous Every 8 hours 08/19/20 1614 08/21/20 1002   08/17/20 0800  ciprofloxacin (CIPRO) tablet 500 mg        500 mg Oral 2 times daily 08/17/20 0411 08/21/20 2152   08/16/20 0600  cefOXitin (MEFOXIN) 2 g in sodium chloride 0.9 % 100 mL IVPB        2 g 200 mL/hr over 30 Minutes Intravenous To Radiology 08/14/20  1620 08/16/20 1704        I have personally reviewed the following labs and images: CBC: Recent Labs  Lab 08/27/20 1015 08/28/20 0501 08/29/20 0947 08/30/20 1036 08/31/20 0504  WBC 13.4* 13.1* 13.2* 12.8* 13.7*  NEUTROABS 10.5*  --   --   --   --   HGB 10.3* 10.8* 11.1* 9.7* 9.7*  HCT 30.9* 32.3* 32.6* 29.5* 29.3*  MCV 92.2 93.1 91.1 95.2 94.5  PLT 260 282 341 321 311   BMP &GFR Recent Labs  Lab 08/27/20 1015 08/28/20 0501 08/29/20 0947 08/30/20 1036 08/31/20 0504  NA 148* 153* 152* 150* 147*  K 5.1 4.0 3.2* 3.7 3.8  CL 117* 119* 122* 121* 120*  CO2 25 25 17* 22 20*  GLUCOSE 124* 240* 220* 126* 207*  BUN '14 14 14 14 13  '$ CREATININE 0.38* 0.50* 0.62 0.66 0.60*  CALCIUM 8.9 9.0 9.2 9.2 8.9  MG 2.3 2.2 2.3 2.2 2.2  PHOS 3.2 3.1 2.4* 2.5 2.7   Estimated Creatinine Clearance: 85.7 mL/min (A) (by C-G formula based on SCr of 0.6 mg/dL (L)). Liver & Pancreas: Recent Labs  Lab 08/27/20 1015 08/28/20 0501 08/29/20 0947 08/30/20 1036 08/31/20 0504  AST 164* 171* 169* 139* 128*  ALT 103* 123* 136* 127* 122*  ALKPHOS 140* 156* 165* 156* 151*  BILITOT 12.6* 12.9* 11.8* 9.8* 9.4*  PROT 6.3* 6.5 6.7 6.3* 6.0*  ALBUMIN 2.3* 2.3* 2.6* 2.2* 2.3*   No results for input(s): LIPASE, AMYLASE in the last 168 hours. No results for input(s): AMMONIA in the last 168 hours. Diabetic: No results for input(s): HGBA1C in the last 72 hours.  Recent Labs  Lab 08/30/20 2016 08/31/20 0002 08/31/20 0400 08/31/20 0750 08/31/20 1229  GLUCAP 156* 186* 173* 148* 262*   Cardiac Enzymes: No results for input(s): CKTOTAL, CKMB, CKMBINDEX, TROPONINI in the last 168  hours. No results for input(s): PROBNP in the last 8760 hours. Coagulation Profile: Recent Labs  Lab 08/27/20 1015  INR 1.2   Thyroid Function Tests: No results for input(s): TSH, T4TOTAL, FREET4, T3FREE, THYROIDAB in the last 72 hours. Lipid Profile: No results for input(s): CHOL, HDL, LDLCALC, TRIG, CHOLHDL, LDLDIRECT in the last 72 hours. Anemia Panel: No results for input(s): VITAMINB12, FOLATE, FERRITIN, TIBC, IRON, RETICCTPCT in the last 72 hours. Urine analysis: No results found for: COLORURINE, APPEARANCEUR, LABSPEC, PHURINE, GLUCOSEU, HGBUR, BILIRUBINUR, KETONESUR, PROTEINUR, UROBILINOGEN, NITRITE, LEUKOCYTESUR Sepsis Labs: Invalid input(s): PROCALCITONIN, [place]  Microbiology: Recent Results (from the past 240 hour(s))  Body fluid culture w Gram Stain     Status: None (Preliminary result)   Collection Time: 08/22/20 11:12 AM   Specimen: BILE; Body Fluid  Result Value Ref Range Status   Specimen Description   Final    BILE Performed at [facility] [address], [place], [place] [address]    Special Requests   Final    Normal Performed at [facility], [place] [address] [place], [place] [address]    Gram Stain   Final    NO WBC SEEN MODERATE YEAST FEW GRAM POSITIVE COCCI    Culture   Final    MODERATE STAPHYLOCOCCUS EPIDERMIDIS MODERATE CANDIDA GLABRATA Sent to [place] for further susceptibility testing. Performed at [facility], [place] [address], [place], [place] [address]    Report Status PENDING  Incomplete   Organism ID, Bacteria STAPHYLOCOCCUS EPIDERMIDIS  Final      Susceptibility   Staphylococcus epidermidis - MIC*  CIPROFLOXACIN >=8 RESISTANT Resistant     ERYTHROMYCIN >=8 RESISTANT Resistant     GENTAMICIN <=0.5 SENSITIVE Sensitive     OXACILLIN >=4 RESISTANT Resistant     TETRACYCLINE >=16 RESISTANT Resistant     VANCOMYCIN 1 SENSITIVE Sensitive     TRIMETH/SULFA <=10 SENSITIVE Sensitive      CLINDAMYCIN >=8 RESISTANT Resistant     RIFAMPIN <=0.5 SENSITIVE Sensitive     Inducible Clindamycin NEGATIVE Sensitive     * MODERATE STAPHYLOCOCCUS EPIDERMIDIS  Antifungal AST 9 Drug Panel     Status: None   Collection Time: 08/22/20 11:12 AM  Result Value Ref Range Status   Organism ID, Yeast Candida glabrata  Corrected    Comment: (NOTE) Identification performed by account, not confirmed by this laboratory. CORRECTED ON 08/04 AT 1436: PREVIOUSLY REPORTED AS Preliminary report    Amphotericin B MIC 0.5 ug/mL  Final    Comment: (NOTE) *Breakpoints have been established for only some organism-drug combinations as indicated. Results of this test are labeled for research purposes only by the assay's manufacturer. The performance characteristics of this assay have not been established by the manufacturer. The result should not be used for treatment or for diagnostic purposes without confirmation of the diagnosis by another medically established diagnostic product or procedure. The performance characteristics were determined by [facility].    Anidulafungin MIC Comment  Final    Comment: (NOTE) 0.03 ug/mL Susceptible *Breakpoints have been established for only some organism-drug combinations as indicated. Results of this test are labeled for research purposes only by the assay's manufacturer. The performance characteristics of this assay have not been established by the manufacturer. The result should not be used for treatment or for diagnostic purposes without confirmation of the diagnosis by another medically established diagnostic product or procedure. The performance characteristics were determined by [facility].    Caspofungin MIC Comment  Final    Comment: 0.06 ug/mL Susceptible   Micafungin MIC Comment  Final    Comment: 0.016 ug/mL Susceptible   Posaconazole MIC 2.0 ug/mL  Final    Comment: (NOTE) *Breakpoints have been established for only some organism-drug combinations  as indicated. Results of this test are labeled for research purposes only by the assay's manufacturer. The performance characteristics of this assay have not been established by the manufacturer. The result should not be used for treatment or for diagnostic purposes without confirmation of the diagnosis by another medically established diagnostic product or procedure. The performance characteristics were determined by [facility].    Fluconazole Islt MIC 16.0 ug/mL  Final    Comment: Susceptible Dose Dependent   Flucytosine MIC 0.06 ug/mL or less  Final   Itraconazole MIC 1.0 ug/mL  Final   Voriconazole MIC 0.25 ug/mL  Final    Comment: (NOTE) Performed At: [facility] [place], [place] [id number] Rush Farmer MD [id number]    Source [phone]  Final    Comment: Performed at [facility], [place] [address], [place], [place] [address]  Culture, blood (routine x 2)     Status: None   Collection Time: 08/26/20  2:11 PM   Specimen: BLOOD  Result Value Ref Range Status   Specimen Description   Final    BLOOD BLOOD RIGHT HAND Performed at [facility] [address], [place], [place] [address]    Special Requests   Final    BOTTLES DRAWN AEROBIC ONLY Blood Culture adequate volume Performed at [facility] [address]  [place], [place] [address]    Culture   Final    NO GROWTH 5 DAYS Performed at [facility], [place] [address] [place], [place] [address]    Report Status 08/31/2020 FINAL  Final    Radiology Studies: No results found.    Reily Ilic T. Sinton  If 7PM-7AM, please contact night-coverage [email or web address] 08/31/2020, 3:51 PM

## 2020-08-31 NOTE — Progress Notes (Signed)
Just spoke with IV team RN Nira Conn, she clarified that Mr Funez's midline can be treated as a central line and can be a nurse blood draw from that line.

## 2020-08-31 NOTE — TOC Progression Note (Signed)
Transition of Care [facility]) - Progression Note    Patient Details  Name: Moussa Kissler MRN: [id number] Date of Birth: 1955/02/16  [facility] CM/SW Contact  Jasie Meleski, Marjie Skiff, RN Phone Number: 08/31/2020, 10:33 AM  Clinical Narrative:    [facility] contacted to alert of dc tomorrow. Orders for home health already previously faxed to [facility].   Expected Discharge Plan: [place] Barriers to Discharge: Continued Medical Work up  Expected Discharge Plan and Services Expected Discharge Plan: [place]   Discharge Planning Services: CM Consult Post Acute Care Choice: [place] arrangements for the past 2 months: Single Family Home                 DME Arranged: [place] rolling DME Agency: [employer] Date DME Agency Contacted: 08/20/20 Time DME Agency Contacted: 1501 Representative spoke with at DME Agency: Freda Munro HH Arranged: OT, PT, RN [facility] Agency: [place] Date [place]: 08/20/20 Time [place]: 1501 Representative spoke with at [place]: [place] Interventions    Readmission Risk Interventions No flowsheet data found.

## 2020-09-01 ENCOUNTER — Inpatient Hospital Stay (HOSPITAL_COMMUNITY): Payer: Medicare HMO

## 2020-09-01 DIAGNOSIS — E440 Moderate protein-calorie malnutrition: Secondary | ICD-10-CM | POA: Diagnosis not present

## 2020-09-01 DIAGNOSIS — K831 Obstruction of bile duct: Secondary | ICD-10-CM | POA: Diagnosis not present

## 2020-09-01 DIAGNOSIS — R112 Nausea with vomiting, unspecified: Secondary | ICD-10-CM | POA: Diagnosis not present

## 2020-09-01 DIAGNOSIS — C259 Malignant neoplasm of pancreas, unspecified: Secondary | ICD-10-CM | POA: Diagnosis not present

## 2020-09-01 LAB — COMPREHENSIVE METABOLIC PANEL
ALT: 116 U/L — ABNORMAL HIGH (ref 0–44)
AST: 123 U/L — ABNORMAL HIGH (ref 15–41)
Albumin: 2.1 g/dL — ABNORMAL LOW (ref 3.5–5.0)
Alkaline Phosphatase: 154 U/L — ABNORMAL HIGH (ref 38–126)
Anion gap: 7 (ref 5–15)
BUN: 16 mg/dL (ref 8–23)
CO2: 20 mmol/L — ABNORMAL LOW (ref 22–32)
Calcium: 8.7 mg/dL — ABNORMAL LOW (ref 8.9–10.3)
Chloride: 113 mmol/L — ABNORMAL HIGH (ref 98–111)
Creatinine, Ser: 0.65 mg/dL (ref 0.61–1.24)
GFR, Estimated: >60 mL/min (ref >60)
Glucose, Bld: 178 mg/dL — ABNORMAL HIGH (ref 70–99)
Potassium: 3.7 mmol/L (ref 3.5–5.1)
Sodium: 140 mmol/L (ref 135–145)
Total Bilirubin: 8.9 mg/dL — ABNORMAL HIGH (ref 0.3–1.2)
Total Protein: 5.9 g/dL — ABNORMAL LOW (ref 6.5–8.1)

## 2020-09-01 LAB — CBC
HCT: 27.5 % — ABNORMAL LOW (ref 39.0–52.0)
Hemoglobin: 9.3 g/dL — ABNORMAL LOW (ref 13.0–17.0)
MCH: 31.7 pg (ref 26.0–34.0)
MCHC: 33.8 g/dL (ref 30.0–36.0)
MCV: 93.9 fL (ref 80.0–100.0)
Platelets: 314 10*3/uL (ref 150–400)
RBC: 2.93 MIL/uL — ABNORMAL LOW (ref 4.22–5.81)
RDW: 22.9 % — ABNORMAL HIGH (ref 11.5–15.5)
WBC: 14.2 10*3/uL — ABNORMAL HIGH (ref 4.0–10.5)
nRBC: 0 % (ref 0.0–0.2)

## 2020-09-01 LAB — BLOOD GAS, ARTERIAL
Acid-base deficit: 4.3 mmol/L — ABNORMAL HIGH (ref 0.0–2.0)
Bicarbonate: 18.5 mmol/L — ABNORMAL LOW (ref 20.0–28.0)
FIO2: 21
O2 Saturation: 99.1 %
Patient temperature: 98.6
pCO2 arterial: 27.5 mmHg — ABNORMAL LOW (ref 32.0–48.0)
pH, Arterial: 7.443 (ref 7.350–7.450)
pO2, Arterial: 133 mmHg — ABNORMAL HIGH (ref 83.0–108.0)

## 2020-09-01 LAB — MAGNESIUM: Magnesium: 2.1 mg/dL (ref 1.7–2.4)

## 2020-09-01 LAB — LACTIC ACID, PLASMA: Lactic Acid, Venous: 1.6 mmol/L (ref 0.5–1.9)

## 2020-09-01 LAB — GLUCOSE, CAPILLARY
Glucose-Capillary: 151 mg/dL — ABNORMAL HIGH (ref 70–99)
Glucose-Capillary: 157 mg/dL — ABNORMAL HIGH (ref 70–99)
Glucose-Capillary: 163 mg/dL — ABNORMAL HIGH (ref 70–99)
Glucose-Capillary: 173 mg/dL — ABNORMAL HIGH (ref 70–99)
Glucose-Capillary: 194 mg/dL — ABNORMAL HIGH (ref 70–99)
Glucose-Capillary: 237 mg/dL — ABNORMAL HIGH (ref 70–99)

## 2020-09-01 LAB — PHOSPHORUS: Phosphorus: 2.8 mg/dL (ref 2.5–4.6)

## 2020-09-01 MED ORDER — FREE WATER
250.0000 mL | Status: DC
  Administered 2020-09-01 – 2020-09-02 (×9): 250 mL

## 2020-09-01 MED ORDER — IOHEXOL 9 MG/ML PO SOLN
ORAL | Status: AC
  Filled 2020-09-01: qty 1000

## 2020-09-01 MED ORDER — IOHEXOL 9 MG/ML PO SOLN: 1000.0000 mL | ORAL | Status: DC

## 2020-09-01 MED ORDER — IOHEXOL 9 MG/ML PO SOLN
1000.0000 mL | ORAL | Status: AC
  Administered 2020-09-01 (×2): 500 mL

## 2020-09-01 MED ORDER — ACETAMINOPHEN 325 MG PO TABS: 650.0000 mg | ORAL_TABLET | Freq: Four times a day (QID) | ORAL | Status: DC | PRN

## 2020-09-01 MED ORDER — IRBESARTAN 300 MG PO TABS
300.0000 mg | ORAL_TABLET | Freq: Every day | ORAL | Status: DC
Start: 2020-09-01 — End: 2020-09-06
  Administered 2020-09-01 – 2020-09-06 (×6): 300 mg via ORAL
  Filled 2020-09-01 (×6): qty 1

## 2020-09-01 MED ORDER — ACETAMINOPHEN 325 MG PO TABS
650.0000 mg | ORAL_TABLET | Freq: Once | ORAL | Status: AC
  Administered 2020-09-01: 650 mg via ORAL

## 2020-09-01 NOTE — Progress Notes (Signed)
This RN messaged Dr. Cyndia Skeeters in regards to patient's yellow MEWS score; HR 120 bpm after ambulation to chair by NT. This RN and NT will check patient's VS more frequently per protocol.

## 2020-09-01 NOTE — Progress Notes (Addendum)
   09/01/20 1653  Assess: MEWS Score  Temp 98.7 F (37.1 C)  BP (!) 154/57  Pulse Rate (!) 116  Resp 14  SpO2 100 %  O2 Device Room Air  Assess: MEWS Score  MEWS Temp 0  MEWS Systolic 0  MEWS Pulse 2  MEWS RR 0  MEWS LOC 0  MEWS Score 2  MEWS Score Color Yellow  Assess: SIRS CRITERIA  SIRS Temperature  0  SIRS Pulse 1  SIRS Respirations  0  SIRS WBC 0  SIRS Score Sum  1  Yellow MEWS protocol initiated on day shift at 1445, MD [place] alerted by day RN Hope. Will continue to follow MEWS guidelines, night shift charge RN Renita M. notified.

## 2020-09-01 NOTE — Progress Notes (Signed)
PROGRESS NOTE  Jeff Jordan [id number] DOB: 06/08/1955   PCP: Georganna Skeans, PA-C  Patient is from: Home.  Lives alone.  However, daughter lives close by.  DOA: 08/14/2020 LOS: 17  Chief complaints:  No chief complaint on file.    Brief Narrative / Interim history: 65 year old M with PMH of pancreatic adenocarcinoma, DM-2, HTN and FTT presenting for biliary obstruction due to pancreatic adenocarcinoma after he failed biliary stent placement by ERCP at GI office on 7/19 due to duodenal stenosis.  He had percutaneous drain placed on 7/21 that was converted to internal and external biliary drain on 7/22.  He underwent cholangiogram on 7/28.  Biliary fluid with Candida glabrata and staph epidermis.  Started on Diflucan and ceftriaxone on 7/29 which were since changed to Eraxis and IV Unasyn, and he completed 5 days course on 8/4.  LFT and bilirubin improving.  G-tube placed by IR on 8/1 after unsuccessful attempt for jejunostomy tube due to inability to cannulate the pylorus.  Tube feeds restarted and titrated to goal rate.  He is also on clear liquid diet.  Still with hypernatremia.  Increasing free water.  IR following, and planning capping trial of his biliary drain, and watching over the weekend.   Subjective: Seen and examined earlier this morning.  No major events overnight or this morning.  Continues to endorse RUQ pain.  He rates pain 7-8 on a scale of 10.  He says diarrhea has slowed down.  Denies nausea or vomiting.  Denies chest pain or dyspnea.  Objective: Vitals:   09/01/20 0404 09/01/20 0500 09/01/20 1001 09/01/20 1444  BP: (!) 142/62  (!) 146/66 (!) 151/55  Pulse: (!) 110  (!) 109 (!) 121  Resp: '15  14 17  '$ Temp: 99.6 F (37.6 C)   (!) 100.5 F (38.1 C)  TempSrc: Oral   Oral  SpO2: 100%  100% 98%  Weight:  67.6 kg    Height:        Intake/Output Summary (Last 24 hours) at 09/01/2020 1640 Last data filed at 09/01/2020 1349 Gross per 24 hour  Intake 2631 ml  Output  1365 ml  Net 1266 ml   Filed Weights   08/29/20 0500 08/30/20 0445 09/01/20 0500  Weight: 65.2 kg 66.7 kg 67.6 kg    Examination:  GENERAL: Frail and chronically ill-appearing.  Nontoxic. HEENT: MMM.  Sclera icteric. NECK: Supple.  No apparent JVD.  RESP:  No IWOB.  Fair aeration bilaterally. CVS: HR 100-110.  Regular rhythm.  Heart sounds normal.  ABD/GI/GU: BS+. Abd soft, NTND.  RUQ biliary drain in place. MSK/EXT:  Moves extremities.  Significant muscle mass and subcu fat loss.  No edema. SKIN: Jaundice. NEURO: Awake and alert. Oriented appropriately.  No apparent focal neuro deficit. PSYCH: Calm. Normal affect.  Procedures:  -7/19-outpatient biliary stent placement unsuccessful -7/21-percutaneous drain placement -7/22-conversion of external biliary drain to internal and external biliary drain  -7/28-IR cholangiogram on 7/28 -8/01-G-tube placed by IR   Microbiology summarized: COVID-19 PCR nonreactive. Biliary culture with staph epidermis and Candida glabrata  Assessment & Plan: Biliary obstruction due to pancreatic adenocarcinoma Elevated liver enzymes/obstructive jaundice-LFT and bilirubin improved. Gastric outlet obstruction -Outpatient ERCP and biliary stent placement on 7/19 unsuccessful due to duodenal stenosis. -PERC biliary drain on 7/21, conversion to internal and external biliary drain on 7/22, IR cholangiogram on 7/28 -G-tube by IR on 8/1 after unsuccessful attempt for jejunostomy tube due to inability to cannulate the pylorus.   -IR was planning capping  trial starting 8/5 but does not look like this has happened yet. -Continue continuous tube feed -Tolerating clear liquid diet as well. Not sure if we can advance beyond this with his pyloric stenosis. -Has referral to oncology office in [place] [facility] -Pain controlled with p.o. medications -Hold home Crestor.  Biliary infection/cholangitis in the setting of the above: Biliary fluid culture with Candida glabrata  and staph epidermis.  Staph epidermis likely contaminant.  Completed 5 days of Eraxis and Unasyn, then Augmentin on 8/4.  Spiked mild fever to 100.5 this afternoon.  Leukocytosis likely.  Tachycardic to 120s but this was with activity.  BP slightly elevated. -ID re-consulted, and appreciate recs:  -Closely monitor fever, CBC, signs of sepsis  -Blood cultures, BSA and CT a/p if further fever, worsening leukocytosis or signs of sepsis=  Chest pain: Likely due to underlying malignancy.  EKG, troponin, BNP and x-ray reassuring.  Resolved.   Diarrhea: Reportedly had soft mushy stools.  Likely due to tube feed.  Abdominal exam benign.  Encouraged him to ask for Imodium more frequently. -Imodium per tube as needed.  Hypernatremia likely hypovolemic. Na 150>> > 140.  Resolved. -Decrease FW from 300-250 cc every 4 hours -Discontinue Aldactone -Continue Avapro-increase to 300 mg for BP -Encourage p.o. water -Recheck in the morning  Controlled NIDDM-2 with hyperglycemia and hyperglycemia: On Jardiance, metformin and Actos at home.  A1c 5.3%. Recent Labs  Lab 08/31/20 2044 09/01/20 0001 09/01/20 0410 09/01/20 0739 09/01/20 1218  GLUCAP 185* 173* 163* 151* 194*  -Levemir 32 units twice daily -Continue SSI-moderate -NovoLog 5 units every 4 hours for TF coverage -Continue holding home Crestor with elevated LFT -Patient may need insulin on discharge for tube feed coverage.  I do not think his p.o. meds are ideal in his situation.  Normocytic anemia/anemia of chronic disease: H&H relatively stable. Recent Labs    08/23/20 0606 08/24/20 0634 08/25/20 0718 08/26/20 0720 08/27/20 1015 08/28/20 0501 08/29/20 0947 08/30/20 1036 08/31/20 0504 09/01/20 0450  HGB 11.7* 10.7* 10.1* 10.2* 10.3* 10.8* 11.1* 9.7* 9.7* 9.3*  -Continue monitoring   Essential hypertension: BP slightly elevated. -Continue amlodipine 10 mg daily -Increase Avapro to 300 mg daily -Continue hydralazine IV as  needed  Hypokalemia: K 3.8.  Resolved. -Recheck in the morning  Metabolic acidosis: Likely due to diarrhea.  Improving -Manage diarrhea as above.   HLD: Continue hold Crestor in the setting of elevated LFT.   GERD: Protonix 40 mg IV daily   Thrush: Nystatin swish and swallow.  He is also on Eraxis now.  Generalized weakness/debility -PT/OT eval and therapy  Leukocytosis/bandemia: WBC slightly up. -Continue monitoring  Adult failure to thrive/moderate malnutrition-poor p.o. intake in the setting of adenocarcinoma. Body mass index is 21.38 kg/m. Nutrition Problem: Moderate Malnutrition Etiology: chronic illness, cancer and cancer related treatments Signs/Symptoms: mild fat depletion, mild muscle depletion, moderate muscle depletion Interventions: Tube feeding   DVT prophylaxis:  SCDs Start: 08/14/20 1624  Code Status: Full code Family Communication: Patient and/or RN.  None at bedside today Level of care: Med-Surg Status is: Inpatient  Remains inpatient appropriate because:Ongoing diagnostic testing needed not appropriate for outpatient work up, IV treatments appropriate due to intensity of illness or inability to take PO, and Inpatient level of care appropriate due to severity of illness  Dispo: The patient is from: Home              Anticipated d/c is to: Home  Patient currently is not medically stable to d/c.   Difficult to place patient No       Consultants:  GI IR ID   Sch Meds:  Scheduled Meds:  amLODipine  10 mg Oral Daily   cholestyramine light  4 g Oral BID   feeding supplement  1 Container Oral TID BM   feeding supplement (PROSource TF)  45 mL Per Tube TID   ferrous sulfate  325 mg Oral Q breakfast   free water  250 mL Per Tube Q4H   insulin aspart  0-15 Units Subcutaneous TID WC   insulin aspart  0-5 Units Subcutaneous QHS   insulin aspart  4 Units Subcutaneous Q4H   insulin detemir  30 Units Subcutaneous BID   irbesartan  300 mg  Oral Daily   pantoprazole  40 mg Intravenous Q1400   sodium chloride flush  10-40 mL Intracatheter Q12H   sodium chloride flush  5 mL Intracatheter Q8H   sodium chloride flush  5 mL Intracatheter Q8H   Continuous Infusions:  feeding supplement (OSMOLITE 1.5 CAL) 55 mL/hr at 09/01/20 1100   PRN Meds:.doxylamine (Sleep), labetalol, loperamide HCl, morphine injection, ondansetron (ZOFRAN) IV, oxyCODONE, sodium chloride flush  Antimicrobials: Anti-infectives (From admission, onward)    Start     Dose/Rate Route Frequency Ordered Stop   08/28/20 1300  amoxicillin-clavulanate (AUGMENTIN) 400-57 MG/5ML suspension 875 mg        875 mg Per Tube Every 12 hours 08/28/20 1152 08/30/20 2143   08/27/20 1500  vancomycin (VANCOCIN) IVPB 1000 mg/200 mL premix        1,000 mg 200 mL/hr over 60 Minutes Intravenous To Radiology 08/24/20 1644 08/27/20 1644   08/27/20 1000  anidulafungin (ERAXIS) 100 mg in sodium chloride 0.9 % 100 mL IVPB       See Hyperspace for full Linked Orders Report.   100 mg 78 mL/hr over 100 Minutes Intravenous Daily 08/26/20 1115 08/30/20 2359   08/26/20 1230  anidulafungin (ERAXIS) 200 mg in sodium chloride 0.9 % 200 mL IVPB       See Hyperspace for full Linked Orders Report.   200 mg 78 mL/hr over 200 Minutes Intravenous Once 08/26/20 1115 08/27/20 0008   08/26/20 1230  Ampicillin-Sulbactam (UNASYN) 3 g in sodium chloride 0.9 % 100 mL IVPB  Status:  Discontinued        3 g 200 mL/hr over 30 Minutes Intravenous Every 6 hours 08/26/20 1147 08/28/20 1152   08/24/20 1800  cefTRIAXone (ROCEPHIN) 2 g in sodium chloride 0.9 % 100 mL IVPB  Status:  Discontinued        2 g 200 mL/hr over 30 Minutes Intravenous Every 24 hours 08/24/20 1640 08/26/20 1147   08/24/20 1800  fluconazole (DIFLUCAN) IVPB 100 mg  Status:  Discontinued        100 mg 50 mL/hr over 60 Minutes Intravenous Every 24 hours 08/24/20 1640 08/26/20 1115   08/21/20 1100  metroNIDAZOLE (FLAGYL) tablet 500 mg  Status:   Discontinued        500 mg Oral Every 8 hours 08/21/20 1002 08/22/20 0744   08/19/20 1800  metroNIDAZOLE (FLAGYL) IVPB 500 mg  Status:  Discontinued        500 mg 100 mL/hr over 60 Minutes Intravenous Every 8 hours 08/19/20 1614 08/21/20 1002   08/17/20 0800  ciprofloxacin (CIPRO) tablet 500 mg        500 mg Oral 2 times daily 08/17/20 0411 08/21/20 2152   08/16/20 0600  cefOXitin (MEFOXIN) 2 g in sodium chloride 0.9 % 100 mL IVPB        2 g 200 mL/hr over 30 Minutes Intravenous To Radiology 08/14/20 1620 08/16/20 1704        I have personally reviewed the following labs and images: CBC: Recent Labs  Lab 08/27/20 1015 08/28/20 0501 08/29/20 0947 08/30/20 1036 08/31/20 0504 09/01/20 0450  WBC 13.4* 13.1* 13.2* 12.8* 13.7* 14.2*  NEUTROABS 10.5*  --   --   --   --   --   HGB 10.3* 10.8* 11.1* 9.7* 9.7* 9.3*  HCT 30.9* 32.3* 32.6* 29.5* 29.3* 27.5*  MCV 92.2 93.1 91.1 95.2 94.5 93.9  PLT 260 282 341 321 311 314   BMP &GFR Recent Labs  Lab 08/28/20 0501 08/29/20 0947 08/30/20 1036 08/31/20 0504 09/01/20 0450  NA 153* 152* 150* 147* 140  K 4.0 3.2* 3.7 3.8 3.7  CL 119* 122* 121* 120* 113*  CO2 25 17* 22 20* 20*  GLUCOSE 240* 220* 126* 207* 178*  BUN '14 14 14 13 16  '$ CREATININE 0.50* 0.62 0.66 0.60* 0.65  CALCIUM 9.0 9.2 9.2 8.9 8.7*  MG 2.2 2.3 2.2 2.2 2.1  PHOS 3.1 2.4* 2.5 2.7 2.8   Estimated Creatinine Clearance: 86.8 mL/min (by C-G formula based on SCr of 0.65 mg/dL). Liver & Pancreas: Recent Labs  Lab 08/28/20 0501 08/29/20 0947 08/30/20 1036 08/31/20 0504 09/01/20 0450  AST 171* 169* 139* 128* 123*  ALT 123* 136* 127* 122* 116*  ALKPHOS 156* 165* 156* 151* 154*  BILITOT 12.9* 11.8* 9.8* 9.4* 8.9*  PROT 6.5 6.7 6.3* 6.0* 5.9*  ALBUMIN 2.3* 2.6* 2.2* 2.3* 2.1*   No results for input(s): LIPASE, AMYLASE in the last 168 hours. No results for input(s): AMMONIA in the last 168 hours. Diabetic: No results for input(s): HGBA1C in the last 72  hours.  Recent Labs  Lab 08/31/20 2044 09/01/20 0001 09/01/20 0410 09/01/20 0739 09/01/20 1218  GLUCAP 185* 173* 163* 151* 194*   Cardiac Enzymes: No results for input(s): CKTOTAL, CKMB, CKMBINDEX, TROPONINI in the last 168 hours. No results for input(s): PROBNP in the last 8760 hours. Coagulation Profile: Recent Labs  Lab 08/27/20 1015  INR 1.2   Thyroid Function Tests: No results for input(s): TSH, T4TOTAL, FREET4, T3FREE, THYROIDAB in the last 72 hours. Lipid Profile: No results for input(s): CHOL, HDL, LDLCALC, TRIG, CHOLHDL, LDLDIRECT in the last 72 hours. Anemia Panel: No results for input(s): VITAMINB12, FOLATE, FERRITIN, TIBC, IRON, RETICCTPCT in the last 72 hours. Urine analysis: No results found for: COLORURINE, APPEARANCEUR, LABSPEC, PHURINE, GLUCOSEU, HGBUR, BILIRUBINUR, KETONESUR, PROTEINUR, UROBILINOGEN, NITRITE, LEUKOCYTESUR Sepsis Labs: Invalid input(s): PROCALCITONIN, [place]  Microbiology: Recent Results (from the past 240 hour(s))  Culture, blood (routine x 2)     Status: None   Collection Time: 08/26/20  2:11 PM   Specimen: BLOOD  Result Value Ref Range Status   Specimen Description   Final    BLOOD BLOOD RIGHT HAND Performed at [facility] [address], [place], [place] [address]    Special Requests   Final    BOTTLES DRAWN AEROBIC ONLY Blood Culture adequate volume Performed at [facility] [address], [place], [place] [address]    Culture   Final    NO GROWTH 5 DAYS Performed at [facility], [place] [address] [place], [place] [address]    Report Status 08/31/2020 FINAL  Final    Radiology Studies: No results  found.    Serenah [address] T. [place]  If 7PM-7AM, please contact night-coverage [email or web address] 09/01/2020, 4:40 PM

## 2020-09-01 NOTE — Progress Notes (Signed)
[facility] Infectious Diseases Follow Up Note  Patient Identification: Patient Name: Jeff Jordan MRN: [id number] Jeff Jordan Date: 08/14/2020 11:19 AM Age: 65 y.o.Today's Date: 09/01/2020   Reason for Visit: Low grade fevers/tachycardia   Principal Problem:   Biliary obstruction Active Problems:   Pancreatic adenocarcinoma ([place])   Diabetes mellitus type 2 in nonobese [facility])   Essential hypertension   Non-intractable vomiting   Malnutrition of moderate degree   Cholangitis   Antibiotics:  Ceftriaxone 7/29-7/31 Fluconazole 7/29-7/31 Unasyn 7/31-8/2, Augmentin 8/2-8/4 Anidulafungin 7/31-8/4 Vancomycin 8/1  Lines/Tubes: Rt arm midline: RUQ biliary tube, LUQ Gastrostomy tube, Urethral catheter  Interval Events: low grade fever this afternoon, WBC slightly trending up    Assessment Obstructive jaundice 2/2 pancreatic adenocarcinoma       Cholangitis - s/p OP bilary stent 7/19 ( 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 Biliary drain cx 7/27 growing MRSE and candida glabrata -Patient is s/p appropriate abtx tx as above  - IR following; capping trial of biliary drain  - Liver enzymes and Bilirubin downtrending  2. Low grade fevers/tachycardia/leukocytosis -One low grade fever 100.5 this afternoon -Denies respiratory or GI symptoms except lower abdominal pain for around 2 days. Denies GU symptoms. Denies back pain/joint pain/swelling and rashes  -Spoke with his RN who says the tachycardia was noted after he was moved around for for cleaning and after ambulation to chair by RN. Has had soft mushy stool 1-2 times a day.  -Midline site with no issues.    Recommendations Would closely watch for fevers, CBC closely and monitor for signs of sepsis in which case obtain blood cultures *2 and  can start broad spectrum antibiotics.   Low threshold to get imaging of the abdomen in case continues to be febrile/increasing WBC or signs of sepsis. Drain output was 500 mls in last 24 hrs at 7 am this morning  Repeat CBC w diff and CMP tomorrow ( ordered)  Plan discussed with patient/family/RN at bedside /Primary   Rest of the management as per the primary team. Thank you for the consult. Please page with pertinent questions or concerns.  ____________________________________________________________________ Subjective patient seen and examined at the bedside. Daughter at bedside. Patient tells me he has lower abdominal pain since last 2 days. Denies fevers, chills and sweats. Denies nausea, vomiting, diarrhea. Denies cough, chest pain and SOB. Denies any back pain, joint pain/swelling and rashes. Denies any GU symptoms.   Vitals BP (!) 151/55 (BP Location: Left Arm)   Pulse (!) 121 Comment: MD notified of yellow MEWS  Temp (!) 100.5 F (38.1 C) (Oral) Comment: parameters not met for PRN intervention  Resp 17   Ht 5' 10" (1.778 m)   Wt 67.6 kg   SpO2 98%   BMI 21.38 kg/m     Physical Exam Constitutional:  Sitting up in the recliner, appears fatigued     Comments:   Cardiovascular:     Rate and Rhythm: Normal rate and regular rhythm.     Heart sounds: soft systolic murmur  Pulmonary:     Effort: Pulmonary effort is normal.     Comments: on room air   Abdominal:     Palpations: Abdomen is soft.     Tenderness: RUQ biliary drain and LUQ gastrostomy tube   Musculoskeletal:        General: No swelling or tenderness.   Skin:    Comments:  No lesions or rashes   Neurological:     General: No focal deficit present.   Psychiatric:        Mood and Affect: Mood normal.   Pertinent Microbiology Results for orders placed or performed during the hospital encounter of 08/14/20  SARS CORONAVIRUS 2 (TAT 6-24 HRS) Nasopharyngeal Nasopharyngeal Swab     Status: None   Collection Time: 08/14/20  6:04 PM   Specimen:  Nasopharyngeal Swab  Result Value Ref Range Status   SARS Coronavirus 2 NEGATIVE NEGATIVE Final    Comment: (NOTE) SARS-CoV-2 target nucleic acids are NOT DETECTED.  The SARS-CoV-2 RNA is generally detectable in upper and lower respiratory specimens during the acute phase of infection. Negative results do not preclude SARS-CoV-2 infection, do not rule out co-infections with other pathogens, and should not be used as the sole basis for treatment or other patient management decisions. Negative results must be combined with clinical observations, patient history, and epidemiological information. The expected result is Negative.  Fact Sheet for Patients: SugarRoll.be  Fact Sheet for Healthcare Providers: https://www.woods-mathews.com/  This test is not yet approved or cleared by the [place] FDA and  has been authorized for detection and/or diagnosis of SARS-CoV-2 by FDA under an Emergency Use Authorization (EUA). This EUA will remain  in effect (meaning this test can be used) for the duration of the COVID-19 declaration under Se ction 564(b)(1) of the Act, 21 U.S.C. section 360bbb-3(b)(1), unless the authorization is terminated or revoked sooner.  Performed at [facility], [place] [address], [place], [place] [address]   Body fluid culture w Gram Stain     Status: None   Collection Time: 08/22/20 11:12 AM   Specimen: BILE; Body Fluid  Result Value Ref Range Status   Specimen Description   Final    BILE Performed at [place] [address], [place], [place] [address]    Special Requests   Final    Normal Performed at [facility], [place] [address], [place], [place] [address]    Gram Stain   Final    NO WBC SEEN MODERATE YEAST FEW GRAM POSITIVE COCCI    Culture   Final    MODERATE STAPHYLOCOCCUS EPIDERMIDIS MODERATE CANDIDA GLABRATA SEE SEPARATE REPORT Performed at [facility], [place] [address] [place], [place] [address]    Report Status 08/31/2020 FINAL  Final   Organism ID, Bacteria STAPHYLOCOCCUS EPIDERMIDIS  Final      Susceptibility   Staphylococcus epidermidis - MIC*    CIPROFLOXACIN >=8 RESISTANT Resistant     ERYTHROMYCIN >=8 RESISTANT Resistant     GENTAMICIN <=0.5 SENSITIVE Sensitive     OXACILLIN >=4 RESISTANT Resistant     TETRACYCLINE >=16 RESISTANT Resistant     VANCOMYCIN 1 SENSITIVE Sensitive     TRIMETH/SULFA <=10 SENSITIVE Sensitive     CLINDAMYCIN >=8 RESISTANT Resistant     RIFAMPIN <=0.5 SENSITIVE Sensitive     Inducible Clindamycin NEGATIVE Sensitive     * MODERATE STAPHYLOCOCCUS EPIDERMIDIS  Antifungal AST 9 Drug Panel     Status: None   Collection Time: 08/22/20 11:12 AM  Result Value Ref Range Status   Organism ID, Yeast Candida glabrata  Corrected    Comment: (NOTE) Identification performed by account, not confirmed by this laboratory. CORRECTED ON 08/04 AT 1436: PREVIOUSLY REPORTED AS Preliminary report    Amphotericin B MIC 0.5 ug/mL  Final    Comment: (NOTE) *Breakpoints have been established for  only some organism-drug combinations as indicated. Results of this test are labeled for research purposes only by the assay's manufacturer. The performance characteristics of this assay have not been established by the manufacturer. The result should not be used for treatment or for diagnostic purposes without confirmation of the diagnosis by another medically established diagnostic product or procedure. The performance characteristics were determined by [facility].    Anidulafungin MIC Comment  Final    Comment: (NOTE) 0.03 ug/mL Susceptible *Breakpoints have been established for only some organism-drug combinations as indicated. Results of this test are labeled for research purposes only by the assay's manufacturer. The performance characteristics of this assay have not been established by the manufacturer. The result  should not be used for treatment or for diagnostic purposes without confirmation of the diagnosis by another medically established diagnostic product or procedure. The performance characteristics were determined by [facility].    Caspofungin MIC Comment  Final    Comment: 0.06 ug/mL Susceptible   Micafungin MIC Comment  Final    Comment: 0.016 ug/mL Susceptible   Posaconazole MIC 2.0 ug/mL  Final    Comment: (NOTE) *Breakpoints have been established for only some organism-drug combinations as indicated. Results of this test are labeled for research purposes only by the assay's manufacturer. The performance characteristics of this assay have not been established by the manufacturer. The result should not be used for treatment or for diagnostic purposes without confirmation of the diagnosis by another medically established diagnostic product or procedure. The performance characteristics were determined by [facility].    Fluconazole Islt MIC 16.0 ug/mL  Final    Comment: Susceptible Dose Dependent   Flucytosine MIC 0.06 ug/mL or less  Final   Itraconazole MIC 1.0 ug/mL  Final   Voriconazole MIC 0.25 ug/mL  Final    Comment: (NOTE) Performed At: [facility] [place] [address] [place], [place] [id number] Nagendra Sanjai MD Ph:[phone]    Source 183,119  Final    Comment: Performed at [facility], [address] [place], [place] [address]  Culture, blood (routine x 2)     Status: None   Collection Time: 08/26/20  2:11 PM   Specimen: BLOOD  Result Value Ref Range Status   Specimen Description   Final    BLOOD BLOOD RIGHT HAND Performed at [facility], [address] [place], [place] [address]    Special Requests   Final    BOTTLES DRAWN AEROBIC ONLY Blood Culture adequate volume Performed at [facility], [address] [place], [place] [address]    Culture   Final    NO GROWTH 5 DAYS Performed at [facility], [address] [place], [place] [address]    Report Status 08/31/2020 FINAL  Final    Pertinent Lab. CBC Latest Ref Rng & Units 09/01/2020 08/31/2020 08/30/2020  WBC 4.0 - 10.5 K/uL 14.2(H) 13.7(H) 12.8(H)  Hemoglobin 13.0 - 17.0 g/dL 9.3(L) 9.7(L) 9.7(L)  Hematocrit 39.0 - 52.0 % 27.5(L) 29.3(L) 29.5(L)  Platelets 150 - 400 K/uL 314 311 321   CMP Latest Ref Rng & Units 09/01/2020 08/31/2020 08/30/2020  Glucose 70 - 99 mg/dL 178(H) 207(H) 126(H)  BUN 8 - 23 mg/dL 16 13 14  Creatinine 0.61 - 1.24 mg/dL 0.65 0.60(L) 0.66  Sodium 135 - 145 mmol/L 140 147(H) 150(H)  Potassium 3.5 - 5.1 mmol/L 3.7 3.8 3.7  Chloride 98 - 111 mmol/L 113(H) 120(H) 121(H)  CO2 22 - 32 mmol/L 20(L) 20(L)   22  Calcium 8.9 - 10.3 mg/dL 8.7(L) 8.9 9.2  Total Protein 6.5 - 8.1 g/dL 5.9(L) 6.0(L) 6.3(L)  Total Bilirubin 0.3 - 1.2 mg/dL 8.9(H) 9.4(H) 9.8(H)  Alkaline Phos 38 - 126 U/L 154(H) 151(H) 156(H)  AST 15 - 41 U/L 123(H) 128(H) 139(H)  ALT 0 - 44 U/L 116(H) 122(H) 127(H)     Pertinent Imaging today Plain films and CT images have been personally visualized and interpreted; radiology reports have been reviewed. Decision making incorporated into the Impression / Recommendations.  I spent more than 35 minutes for this patient encounter including review of prior medical records, coordination of care  with greater than 50% of time being face to face/counseling and discussing diagnostics/treatment plan with the patient/family.  Electronically signed by:    , MD Infectious Disease Physician [place]  [facility] Pager: [phone]  

## 2020-09-01 NOTE — Progress Notes (Signed)
   09/01/20 2003  Assess: MEWS Score  Temp 99.3 F (37.4 C)  BP (!) 143/56  Pulse Rate (!) 115  Resp (!) 30  Level of Consciousness Alert  SpO2 100 %  O2 Device Room Air  Assess: MEWS Score  MEWS Temp 0  MEWS Systolic 0  MEWS Pulse 2  MEWS RR 2  MEWS LOC 0  MEWS Score 4  MEWS Score Color Red  Assess: if the MEWS score is Yellow or Red  Were vital signs taken at a resting state? Yes  Focused Assessment No change from prior assessment  Does the patient meet 2 or more of the SIRS criteria? Yes  Does the patient have a confirmed or suspected source of infection? Yes  Provider and Rapid Response Notified? Yes  MEWS guidelines implemented *See Row Information* Yes  Treat  MEWS Interventions Administered prn meds/treatments  Pain Scale 0-10  Pain Score 10  Pain Type Acute pain  Pain Location Abdomen  Pain Orientation Right  Pain Descriptors / Indicators Aching  Pain Frequency Intermittent  Pain Onset Sudden  Pain Intervention(s) Medication (See eMAR)  Take Vital Signs  Increase Vital Sign Frequency  Red: Q 1hr X 4 then Q 4hr X 4, if remains red, continue Q 4hrs  Escalate  MEWS: Escalate Red: discuss with charge nurse/RN and provider, consider discussing with RRT  Notify: Charge Nurse/RN  Name of Charge Nurse/RN Notified Librarian, academic RN  Date Charge Nurse/RN Notified 09/01/20  Time Charge Nurse/RN Notified 2005  Notify: Provider  Provider Name/Title Hal Hope  Date Provider Notified 09/01/20  Time Provider Notified 2045  Notification Type Page  Notification Reason Change in status (red MEWS from yellow)  Provider response At bedside  Date of Provider Response 09/01/20  Time of Provider Response 2050  Notify: Rapid Response  Name of Rapid Response RN Notified [place]  Date Rapid Response Notified 09/01/20  Time Rapid Response Notified 2030  Document  Patient Outcome Other (Comment) (still on unit, abdomial imaging, blood cultures, lactic acid, ABGs, tylenol, tele)   Progress note created (see row info) Yes  Assess: SIRS CRITERIA  SIRS Temperature  0  SIRS Pulse 1  SIRS Respirations  1  SIRS WBC 0  SIRS Score Sum  2

## 2020-09-02 ENCOUNTER — Encounter (HOSPITAL_COMMUNITY): Payer: Self-pay | Admitting: Internal Medicine

## 2020-09-02 ENCOUNTER — Inpatient Hospital Stay (HOSPITAL_COMMUNITY): Payer: Medicare HMO

## 2020-09-02 DIAGNOSIS — C259 Malignant neoplasm of pancreas, unspecified: Secondary | ICD-10-CM | POA: Diagnosis not present

## 2020-09-02 DIAGNOSIS — R112 Nausea with vomiting, unspecified: Secondary | ICD-10-CM | POA: Diagnosis not present

## 2020-09-02 DIAGNOSIS — K831 Obstruction of bile duct: Secondary | ICD-10-CM | POA: Diagnosis not present

## 2020-09-02 DIAGNOSIS — E440 Moderate protein-calorie malnutrition: Secondary | ICD-10-CM | POA: Diagnosis not present

## 2020-09-02 LAB — GLUCOSE, CAPILLARY
Glucose-Capillary: 166 mg/dL — ABNORMAL HIGH (ref 70–99)
Glucose-Capillary: 79 mg/dL (ref 70–99)
Glucose-Capillary: 57 mg/dL — ABNORMAL LOW (ref 70–99)
Glucose-Capillary: 95 mg/dL (ref 70–99)
Glucose-Capillary: 147 mg/dL — ABNORMAL HIGH (ref 70–99)
Glucose-Capillary: 210 mg/dL — ABNORMAL HIGH (ref 70–99)
Glucose-Capillary: 144 mg/dL — ABNORMAL HIGH (ref 70–99)

## 2020-09-02 LAB — CBC WITH DIFFERENTIAL/PLATELET
Abs Immature Granulocytes: 0.33 10*3/uL — ABNORMAL HIGH (ref 0.00–0.07)
Basophils Absolute: 0.1 10*3/uL (ref 0.0–0.1)
Basophils Relative: 0 %
Eosinophils Absolute: 0.1 10*3/uL (ref 0.0–0.5)
Eosinophils Relative: 1 %
HCT: 26.5 % — ABNORMAL LOW (ref 39.0–52.0)
Hemoglobin: 9.2 g/dL — ABNORMAL LOW (ref 13.0–17.0)
Immature Granulocytes: 2 %
Lymphocytes Relative: 12 %
Lymphs Abs: 2 10*3/uL (ref 0.7–4.0)
MCH: 32.1 pg (ref 26.0–34.0)
MCHC: 34.7 g/dL (ref 30.0–36.0)
MCV: 92.3 fL (ref 80.0–100.0)
Monocytes Absolute: 1 10*3/uL (ref 0.1–1.0)
Monocytes Relative: 6 %
Neutro Abs: 12.6 10*3/uL — ABNORMAL HIGH (ref 1.7–7.7)
Neutrophils Relative %: 79 %
Platelets: 249 10*3/uL (ref 150–400)
RBC: 2.87 MIL/uL — ABNORMAL LOW (ref 4.22–5.81)
RDW: 21.8 % — ABNORMAL HIGH (ref 11.5–15.5)
WBC: 16.1 10*3/uL — ABNORMAL HIGH (ref 4.0–10.5)
nRBC: 0 % (ref 0.0–0.2)

## 2020-09-02 LAB — PHOSPHORUS: Phosphorus: 3.1 mg/dL (ref 2.5–4.6)

## 2020-09-02 LAB — COMPREHENSIVE METABOLIC PANEL
ALT: 115 U/L — ABNORMAL HIGH (ref 0–44)
AST: 130 U/L — ABNORMAL HIGH (ref 15–41)
Albumin: 2.4 g/dL — ABNORMAL LOW (ref 3.5–5.0)
Alkaline Phosphatase: 155 U/L — ABNORMAL HIGH (ref 38–126)
Anion gap: 7 (ref 5–15)
BUN: 17 mg/dL (ref 8–23)
CO2: 20 mmol/L — ABNORMAL LOW (ref 22–32)
Calcium: 8.8 mg/dL — ABNORMAL LOW (ref 8.9–10.3)
Chloride: 111 mmol/L (ref 98–111)
Creatinine, Ser: 0.51 mg/dL — ABNORMAL LOW (ref 0.61–1.24)
GFR, Estimated: >60 mL/min (ref >60)
Glucose, Bld: 77 mg/dL (ref 70–99)
Potassium: 3.6 mmol/L (ref 3.5–5.1)
Sodium: 138 mmol/L (ref 135–145)
Total Bilirubin: 11 mg/dL — ABNORMAL HIGH (ref 0.3–1.2)
Total Protein: 6.3 g/dL — ABNORMAL LOW (ref 6.5–8.1)

## 2020-09-02 LAB — LACTIC ACID, PLASMA
Lactic Acid, Venous: 2 mmol/L (ref 0.5–1.9)
Lactic Acid, Venous: 1.6 mmol/L (ref 0.5–1.9)

## 2020-09-02 LAB — MAGNESIUM: Magnesium: 2.3 mg/dL (ref 1.7–2.4)

## 2020-09-02 LAB — PROTIME-INR
INR: 1.4 — ABNORMAL HIGH (ref 0.8–1.2)
Prothrombin Time: 17.3 seconds — ABNORMAL HIGH (ref 11.4–15.2)

## 2020-09-02 LAB — AMMONIA: Ammonia: 42 umol/L — ABNORMAL HIGH (ref 9–35)

## 2020-09-02 MED ORDER — IOHEXOL 350 MG/ML SOLN
80.0000 mL | Freq: Once | INTRAVENOUS | Status: AC | PRN
  Administered 2020-09-02: 80 mL via INTRAVENOUS

## 2020-09-02 MED ORDER — ACETAMINOPHEN 325 MG PO TABS
650.0000 mg | ORAL_TABLET | Freq: Once | ORAL | Status: AC
  Administered 2020-09-02: 650 mg via ORAL
  Filled 2020-09-02: qty 2

## 2020-09-02 MED ORDER — LACTATED RINGERS IV BOLUS
1000.0000 mL | Freq: Once | INTRAVENOUS | Status: AC
  Administered 2020-09-02: 1000 mL via INTRAVENOUS

## 2020-09-02 MED ORDER — SODIUM CHLORIDE 0.9 % IV SOLN
3.0000 g | Freq: Four times a day (QID) | INTRAVENOUS | Status: DC
  Administered 2020-09-02 – 2020-09-06 (×15): 3 g via INTRAVENOUS
  Filled 2020-09-02: qty 3
  Filled 2020-09-02: qty 8
  Filled 2020-09-02 (×2): qty 3
  Filled 2020-09-02 (×3): qty 8
  Filled 2020-09-02: qty 3
  Filled 2020-09-02 (×2): qty 8
  Filled 2020-09-02 (×4): qty 3
  Filled 2020-09-02: qty 8

## 2020-09-02 MED ORDER — FREE WATER
200.0000 mL | Status: DC
  Administered 2020-09-02 – 2020-09-06 (×22): 200 mL

## 2020-09-02 NOTE — Progress Notes (Signed)
[facility] Infectious Diseases Follow Up Note  Patient Identification: Patient Name: Jeff Jordan MRN: [id number] [place] Date: 08/14/2020 11:19 AM Age: 65 y.o.Today's Date: 09/02/2020   Reason for Visit: Low grade fevers/tachycardia   Principal Problem:   Biliary obstruction Active Problems:   Pancreatic adenocarcinoma ([place])   Diabetes mellitus type 2 in nonobese [facility])   Essential hypertension   Non-intractable vomiting   Malnutrition of moderate degree   Cholangitis   Antibiotics:  Ceftriaxone 7/29-7/31 Fluconazole 7/29-7/31 Unasyn 7/31-8/2, Augmentin 8/2-8/4 Anidulafungin 7/31-8/4 Vancomycin 8/1  Lines/Tubes: Rt arm midline: RUQ biliary tube, LUQ Gastrostomy tube, Urethral catheter  Interval Events: Continues to have low grade fevers, uptrending leukocytosis, tachycardic and clinically sick looking    Assessment Obstructive jaundice 2/2 pancreatic adenocarcinoma       Cholangitis - s/p OP bilary stent 7/19 ( 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 Biliary drain cx 7/27 growing MRSE and candida glabrata -Patient is s/p appropriate abtx tx as above  - IR following; capping trial of biliary drain  - Liver enzymes and Bilirubin downtrending  2. Low grade fevers/tachycardia/leukocytosis - Patient is more sick looking today than yesterday. He does not report any symptoms except lower abdominal pain. RN says he had 4 episodes of soft stool ( not watery).  - Drain output is decreasing, Liver enzymes are increasing  - PICC line and G tube site exam unremarkable  - CT abd/pelvis 8/7 thickening in the rt colon otherwise unremarkable   Recommendations Start Unasyn given patient is developing sepsis.  Closely monitor fever curve/WBC/signs of worsening sepsis Monitor CBC and CMP Have reached out to IR as his  clinical picture could be related to drain capping  Dscussed with patient/RN at bedside /Primary and IR   Dr Juleen China will be back starting tomorrow.   Rest of the management as per the primary team. Thank you for the consult. Please page with pertinent questions or concerns.  ____________________________________________________________________ Subjective patient seen and examined at the bedside. Patient looks more sick looking today than yesterday. He complains of pain in the lower abdominen. Has low grade fevers. Denies chest pain, cough and SOB. 4 episodes of soft stool today per RN. No complaints at PICC line site.  Vitals BP (!) 152/56   Pulse (!) 109   Temp 98 F (36.7 C) (Oral)   Resp 15   Ht '5\' 10"'$  (1.778 m)   Wt 67.7 kg   SpO2 100%   BMI 21.42 kg/m     Physical Exam Constitutional:  Sitting up in the bed, appears fatigued and tired     Comments:   Cardiovascular:     Rate and Rhythm: Normal rate and regular rhythm.     Heart sounds: soft systolic murmur  Pulmonary:     Effort: Pulmonary effort is normal.     Comments: on room air   Abdominal:     Palpations: Abdomen is soft.     Tenderness: RUQ biliary drain and LUQ gastrostomy tube ( looks clean ), tenderness in the lower abdomen   Musculoskeletal:        General: No swelling or tenderness.   Skin:    Comments: No lesions or rashes   Neurological:     General: No focal deficit present.   Psychiatric:        Mood and Affect:? Depressed   Pertinent Microbiology Results for orders placed or  performed during the hospital encounter of 08/14/20  SARS CORONAVIRUS 2 (TAT 6-24 HRS) Nasopharyngeal Nasopharyngeal Swab     Status: None   Collection Time: 08/14/20  6:04 PM   Specimen: Nasopharyngeal Swab  Result Value Ref Range Status   SARS Coronavirus 2 NEGATIVE NEGATIVE Final    Comment: (NOTE) SARS-CoV-2 target nucleic acids are NOT DETECTED.  The SARS-CoV-2 RNA is generally detectable in upper and  lower respiratory specimens during the acute phase of infection. Negative results do not preclude SARS-CoV-2 infection, do not rule out co-infections with other pathogens, and should not be used as the sole basis for treatment or other patient management decisions. Negative results must be combined with clinical observations, patient history, and epidemiological information. The expected result is Negative.  Fact Sheet for Patients: SugarRoll.be  Fact Sheet for Healthcare Providers: https://www.woods-mathews.com/  This test is not yet approved or cleared by the [place] FDA and  has been authorized for detection and/or diagnosis of SARS-CoV-2 by FDA under an Emergency Use Authorization (EUA). This EUA will remain  in effect (meaning this test can be used) for the duration of the COVID-19 declaration under Se ction 564(b)(1) of the Act, 21 U.S.C. section 360bbb-3(b)(1), unless the authorization is terminated or revoked sooner.  Performed at [facility], [place] [address], [place], [place] [address]   Body fluid culture w Gram Stain     Status: None   Collection Time: 08/22/20 11:12 AM   Specimen: BILE; Body Fluid  Result Value Ref Range Status   Specimen Description   Final    BILE Performed at [place] [address] [place], [place] [address]    Special Requests   Final    Normal Performed at [facility], [place] [address], [place], [place] [address]    Gram Stain   Final    NO WBC SEEN MODERATE YEAST FEW GRAM POSITIVE COCCI    Culture   Final    MODERATE STAPHYLOCOCCUS EPIDERMIDIS MODERATE CANDIDA GLABRATA SEE SEPARATE REPORT Performed at [facility], [place] [address], [place], [place] [address]    Report Status 08/31/2020 FINAL  Final   Organism ID, Bacteria STAPHYLOCOCCUS EPIDERMIDIS  Final      Susceptibility   Staphylococcus epidermidis - MIC*    CIPROFLOXACIN >=8  RESISTANT Resistant     ERYTHROMYCIN >=8 RESISTANT Resistant     GENTAMICIN <=0.5 SENSITIVE Sensitive     OXACILLIN >=4 RESISTANT Resistant     TETRACYCLINE >=16 RESISTANT Resistant     VANCOMYCIN 1 SENSITIVE Sensitive     TRIMETH/SULFA <=10 SENSITIVE Sensitive     CLINDAMYCIN >=8 RESISTANT Resistant     RIFAMPIN <=0.5 SENSITIVE Sensitive     Inducible Clindamycin NEGATIVE Sensitive     * MODERATE STAPHYLOCOCCUS EPIDERMIDIS  Antifungal AST 9 Drug Panel     Status: None   Collection Time: 08/22/20 11:12 AM  Result Value Ref Range Status   Organism ID, Yeast Candida glabrata  Corrected    Comment: (NOTE) Identification performed by account, not confirmed by this laboratory. CORRECTED ON 08/04 AT 1436: PREVIOUSLY REPORTED AS Preliminary report    Amphotericin B MIC 0.5 ug/mL  Final    Comment: (NOTE) *Breakpoints have been established for only some organism-drug combinations as indicated. Results of this test are labeled for research purposes only by the assay's manufacturer. The performance characteristics of this assay have not been established by the manufacturer. The result should not be used for  treatment or for diagnostic purposes without confirmation of the diagnosis by another medically established diagnostic product or procedure. The performance characteristics were determined by [facility].    Anidulafungin MIC Comment  Final    Comment: (NOTE) 0.03 ug/mL Susceptible *Breakpoints have been established for only some organism-drug combinations as indicated. Results of this test are labeled for research purposes only by the assay's manufacturer. The performance characteristics of this assay have not been established by the manufacturer. The result should not be used for treatment or for diagnostic purposes without confirmation of the diagnosis by another medically established diagnostic product or procedure. The performance characteristics were determined by [facility].     Caspofungin MIC Comment  Final    Comment: 0.06 ug/mL Susceptible   Micafungin MIC Comment  Final    Comment: 0.016 ug/mL Susceptible   Posaconazole MIC 2.0 ug/mL  Final    Comment: (NOTE) *Breakpoints have been established for only some organism-drug combinations as indicated. Results of this test are labeled for research purposes only by the assay's manufacturer. The performance characteristics of this assay have not been established by the manufacturer. The result should not be used for treatment or for diagnostic purposes without confirmation of the diagnosis by another medically established diagnostic product or procedure. The performance characteristics were determined by [facility].    Fluconazole Islt MIC 16.0 ug/mL  Final    Comment: Susceptible Dose Dependent   Flucytosine MIC 0.06 ug/mL or less  Final   Itraconazole MIC 1.0 ug/mL  Final   Voriconazole MIC 0.25 ug/mL  Final    Comment: (NOTE) Performed At: [facility] [place], [place] [id number] Rush Farmer MD [id number]    Source [id number]  Final    Comment: Performed at [facility], [place] [address] [place], [place] [address]  Culture, blood (routine x 2)     Status: None   Collection Time: 08/26/20  2:11 PM   Specimen: BLOOD  Result Value Ref Range Status   Specimen Description   Final    BLOOD BLOOD RIGHT HAND Performed at [place] [address], [place], [place] [address]    Special Requests   Final    BOTTLES DRAWN AEROBIC ONLY Blood Culture adequate volume Performed at [place] [address], [place], [place] [address]    Culture   Final    NO GROWTH 5 DAYS Performed at [facility], [place] [address] [place], [place] [address]    Report Status 08/31/2020 FINAL  Final    Pertinent Lab. CBC Latest Ref Rng & Units 09/02/2020 09/01/2020 08/31/2020  WBC 4.0 - 10.5 K/uL 16.1(H) 14.2(H) 13.7(H)  Hemoglobin 13.0 - 17.0 g/dL  9.2(L) 9.3(L) 9.7(L)  Hematocrit 39.0 - 52.0 % 26.5(L) 27.5(L) 29.3(L)  Platelets 150 - 400 K/uL 249 314 311   CMP Latest Ref Rng & Units 09/02/2020 09/01/2020 08/31/2020  Glucose 70 - 99 mg/dL 77 178(H) 207(H)  BUN 8 - 23 mg/dL '17 16 13  '$ Creatinine 0.61 - 1.24 mg/dL 0.51(L) 0.65 0.60(L)  Sodium 135 - 145 mmol/L 138 140 147(H)  Potassium 3.5 - 5.1 mmol/L 3.6 3.7 3.8  Chloride 98 - 111 mmol/L 111 113(H) 120(H)  CO2 22 - 32 mmol/L 20(L) 20(L) 20(L)  Calcium 8.9 - 10.3 mg/dL 8.8(L) 8.7(L) 8.9  Total Protein 6.5 - 8.1 g/dL 6.3(L) 5.9(L) 6.0(L)  Total Bilirubin 0.3 - 1.2 mg/dL 11.0(H) 8.9(H) 9.4(H)  Alkaline Phos 38 - 126 U/L 155(H) 154(H) 151(H)  AST 15 - 41 U/L 130(H) 123(H) 128(H)  ALT 0 - 44 U/L 115(H) 116(H) 122(H)     Pertinent Imaging today Plain films and CT images have been personally visualized and interpreted; radiology reports have been reviewed. Decision making incorporated into the Impression / Recommendations.  CT abdomen/pelvis 09/02/20 FINDINGS: Lower chest: Small bilateral pleural effusions. Bibasilar dependent atelectasis. Heart is normal size.   Hepatobiliary: Percutaneous transpedicle biliary stent is in place terminating in the duodenum. There is pneumobilia. Gas also noted within the gallbladder. No visible suspicious focal hepatic abnormality.   Pancreas: Again seen is the infiltrating mass in the region of the pancreatic head, not significantly changed since prior study. Pancreatic ductal dilatation, stable.   Spleen: No focal abnormality.  Normal size.   Adrenals/Urinary Tract: No adrenal abnormality. No focal renal abnormality. No stones or hydronephrosis. Urinary bladder is unremarkable.   Stomach/Bowel: There is sigmoid diverticulosis. No active diverticulitis. Wall thickening noted in the cecum and ascending colon concerning for colitis. No evidence of bowel obstruction. Gastrostomy tube within the stomach.   Vascular/Lymphatic: Aortic  atherosclerosis. No aneurysm. Prominent upper abdominal lymph nodes. Portacaval lymph node with a short axis diameter of 12 mm again seen, not significantly changed. Mildly prominent central mesenteric lymph nodes are also stable.   Reproductive: Prostate enlargement.   Other: Moderate free fluid in the pelvis.   Musculoskeletal: No acute bony abnormality.   IMPRESSION: Stable pancreatic head mass. Interval percutaneous transhepatic biliary drain placement. Decreasing biliary distension. Pneumobilia now present.   Wall thickening noted within the right colon concerning for colitis.   Sigmoid diverticulosis.  No active diverticulitis.   Gastrostomy tube in the stomach.   Small bilateral effusions, bibasilar atelectasis.   Aortic atherosclerosis.   I spent more than 35 minutes for this patient encounter including review of prior medical records, coordination of care  with greater than 50% of time being face to face/counseling and discussing diagnostics/treatment plan with the patient/family.  Electronically signed by:   Rosiland Oz, MD Infectious Disease Physician [facility] for Infectious Disease Pager: [phone]

## 2020-09-02 NOTE — Progress Notes (Signed)
= PROGRESS NOTE  Jeff Jordan [id number] DOB: 1955/07/24   PCP: Georganna Skeans, PA-C  Patient is from: Home.  Lives alone.  However, daughter lives close by.  DOA: 08/14/2020 LOS: 18  Chief complaints:  No chief complaint on file.    Brief Narrative / Interim history: 65 year old M with PMH of pancreatic adenocarcinoma, DM-2, HTN and FTT presenting for biliary obstruction due to pancreatic adenocarcinoma after he failed biliary stent placement by ERCP at GI office on 7/19 due to duodenal stenosis.  He had percutaneous drain placed on 7/21 that was converted to internal and external biliary drain on 7/22.  He underwent cholangiogram on 7/28.  Biliary fluid with Candida glabrata and staph epidermis.  Started on Diflucan and ceftriaxone on 7/29 which were since changed to Eraxis and IV Unasyn, and he completed 5 days course on 8/4.  LFT and bilirubin improving.  G-tube placed by IR on 8/1 after unsuccessful attempt for jejunostomy tube due to inability to cannulate the pylorus.  Tube feeds restarted and titrated to goal rate.  He is also on clear liquid diet.  IR started drain capping but patient spiked fever with rising leukocytosis and tachycardia.  Repeat CT abdomen and pelvis with stable pancreatic head mass, decreased biliary distention, new pneumobilia and possible right colitis.  Blood cultures obtained.  ID resumed IV Unasyn on 8/7.  Biliary drain uncapped and to gravity.   Subjective: Seen and examined earlier this morning.  Spiked fever to 100.7 about 2 AM earlier this morning.  He also had RUQ pain overnight.  Reports diarrhea that he describes as watery and loose.  He denies chest pain or dyspnea.  Objective: Vitals:   09/02/20 0500 09/02/20 0844 09/02/20 1209 09/02/20 1234  BP:  (!) 134/59 (!) 152/56 (!) 152/56  Pulse:  (!) 105 (!) 109   Resp:  16 15   Temp:  99.1 F (37.3 C) 98 F (36.7 C)   TempSrc:  Oral Oral   SpO2:  92% 100%   Weight: 67.7 kg     Height:         Intake/Output Summary (Last 24 hours) at 09/02/2020 1650 Last data filed at 09/02/2020 1445 Gross per 24 hour  Intake 2797 ml  Output 1790 ml  Net 1007 ml   Filed Weights   08/30/20 0445 09/01/20 0500 09/02/20 0500  Weight: 66.7 kg 67.6 kg 67.7 kg    Examination:  GENERAL: Frail and chronically ill-appearing.  Nontoxic. HEENT: MMM.  Sclera icteric. NECK: Supple.  No apparent JVD.  RESP: On RA.  No IWOB.  Fair aeration bilaterally. CVS:  RRR. Heart sounds normal.  ABD/GI/GU: BS+. Abd soft.  Tenderness over LUQ.  Biliary drain bag empty. MSK/EXT:  Moves extremities.  Significant muscle mass and subcu fat loss.  No edema. SKIN: Skin jaundice. NEURO: Awake and alert. Oriented appropriately.  No apparent focal neuro deficit. PSYCH: Calm. Normal affect.   Procedures:  -7/19-outpatient biliary stent placement unsuccessful -7/21-percutaneous drain placement -7/22-conversion of external biliary drain to internal and external biliary drain  -7/28-IR cholangiogram on 7/28 -8/01-G-tube placed by IR   Microbiology summarized: COVID-19 PCR nonreactive. Biliary culture with staph epidermis and Candida glabrata  Assessment & Plan: Biliary obstruction due to pancreatic adenocarcinoma Elevated liver enzymes/obstructive jaundice-LFT stable.  Bilirubin increased. Biliary infection/cholangitis in the setting of the above Recent Labs  Lab 08/29/20 0947 08/30/20 1036 08/31/20 0504 09/01/20 0450 09/02/20 0345  AST 169* 139* 128* 123* 130*  ALT 136* 127* 122* 116*  115*  ALKPHOS 165* 156* 151* 154* 155*  BILITOT 11.8* 9.8* 9.4* 8.9* 11.0*  PROT 6.7 6.3* 6.0* 5.9* 6.3*  ALBUMIN 2.6* 2.2* 2.3* 2.1* 2.4*   -Outpatient ERCP and biliary stent placement on 7/19 unsuccessful due to duodenal stenosis. -PERC biliary drain on 7/21, conversion to internal and external biliary drain on 7/22, IR cholangiogram on 7/28 -Biliary fluid culture with Candida glabrata and staph epidermis.  Staph  epidermis likely contaminant.   -Completed 5 days of Eraxis and IV Unasyn, then Augmentin on 8/4. -Febrile, tachycardic with rising leukocytosis on 8/6 after capping trial.  Now biliary drain to gravity -CT abdomen and pelvis concerning for new pneumobilia and right-sided colitis. -Appreciate help by ID:  -Resume IV Unasyn  -Follow repeat blood culture -Has referral to oncology office in [place] -Pain controlled with p.o. medications -Hold home Crestor.   Gastric outlet obstruction -G-tube by IR on 8/1 after unsuccessful attempt for jejunostomy tube due to inability to cannulate the pylorus.   -Tolerating clear liquid diet as well. Not sure if we can advance beyond this with his pyloric stenosis.  Chest pain: Likely due to underlying malignancy.  EKG, troponin, BNP and x-ray reassuring.  Resolved.   Diarrhea: Likely due to tube feed but CT raises concern for right colitis.  His tenderness is over RUQ that does not go along with CT finding.  No rebound or guarding. -Imodium per tube as needed.  Hypernatremia likely hypovolemic. Na 150>> > 138. -Decrease FW from 250-200 cc every 4 hours -Continue Avapro-increase to 300 mg for BP -Encourage p.o. water -Recheck in the morning  Controlled NIDDM-2 with hyperglycemia and hyperglycemia: On Jardiance, metformin and Actos at home.  A1c 5.3%. Recent Labs  Lab 09/02/20 0342 09/02/20 0725 09/02/20 0808 09/02/20 1212 09/02/20 1625  GLUCAP 79 57* 95 147* 210*  -Levemir 32 units twice daily -Continue SSI-moderate -NovoLog 5 units every 4 hours for TF coverage -Continue holding home Crestor with elevated LFT -Patient may need insulin on discharge for tube feed coverage.  I do not think his p.o. meds are ideal in his situation.  Normocytic anemia/anemia of chronic disease: H&H relatively stable. Recent Labs    08/24/20 0634 08/25/20 0718 08/26/20 0720 08/27/20 1015 08/28/20 0501 08/29/20 0947 08/30/20 1036 08/31/20 0504  09/01/20 0450 09/02/20 0508  HGB 10.7* 10.1* 10.2* 10.3* 10.8* 11.1* 9.7* 9.7* 9.3* 9.2*  -Continue monitoring  Essential hypertension: BP slightly elevated. -Continue amlodipine 10 mg daily -Continue Avapro to 300 mg daily -Continue hydralazine IV as needed  Hypokalemia: K 3.6.  Resolved. -Recheck in the morning  Metabolic acidosis: Likely due to diarrhea.  Improving -Manage diarrhea as above.   HLD: Continue hold Crestor in the setting of elevated LFT.   GERD: Protonix 40 mg IV daily   Thrush: Nystatin swish and swallow.  He is also on Eraxis now.  Generalized weakness/debility -PT/OT eval and therapy  Leukocytosis/bandemia: WBC rising. -Continue monitoring  Adult failure to thrive/moderate malnutrition-poor p.o. intake in the setting of adenocarcinoma. Body mass index is 21.42 kg/m. Nutrition Problem: Moderate Malnutrition Etiology: chronic illness, cancer and cancer related treatments Signs/Symptoms: mild fat depletion, mild muscle depletion, moderate muscle depletion Interventions: Tube feeding   DVT prophylaxis:  SCDs Start: 08/14/20 1624  Code Status: Full code Family Communication: Patient and/or RN.  None at bedside today Level of care: Telemetry Status is: Inpatient  Remains inpatient appropriate because:Ongoing diagnostic testing needed not appropriate for outpatient work up, IV treatments appropriate due to intensity of illness or  inability to take PO, and Inpatient level of care appropriate due to severity of illness  Dispo: The patient is from: Home              Anticipated d/c is to: Home              Patient currently is not medically stable to d/c.   Difficult to place patient No       Consultants:  GI IR ID   Sch Meds:  Scheduled Meds:  amLODipine  10 mg Oral Daily   cholestyramine light  4 g Oral BID   feeding supplement  1 Container Oral TID BM   feeding supplement (PROSource TF)  45 mL Per Tube TID   ferrous sulfate  325 mg  Oral Q breakfast   free water  250 mL Per Tube Q4H   insulin aspart  0-15 Units Subcutaneous TID WC   insulin aspart  0-5 Units Subcutaneous QHS   insulin aspart  4 Units Subcutaneous Q4H   insulin detemir  30 Units Subcutaneous BID   irbesartan  300 mg Oral Daily   pantoprazole  40 mg Intravenous Q1400   sodium chloride flush  10-40 mL Intracatheter Q12H   sodium chloride flush  5 mL Intracatheter Q8H   sodium chloride flush  5 mL Intracatheter Q8H   Continuous Infusions:  ampicillin-sulbactam (UNASYN) IV     feeding supplement (OSMOLITE 1.5 CAL) 55 mL/hr at 09/02/20 1236   PRN Meds:.doxylamine (Sleep), labetalol, loperamide HCl, morphine injection, ondansetron (ZOFRAN) IV, oxyCODONE, sodium chloride flush  Antimicrobials: Anti-infectives (From admission, onward)    Start     Dose/Rate Route Frequency Ordered Stop   09/02/20 1600  Ampicillin-Sulbactam (UNASYN) 3 g in sodium chloride 0.9 % 100 mL IVPB        3 g 200 mL/hr over 30 Minutes Intravenous Every 6 hours 09/02/20 1520     08/28/20 1300  amoxicillin-clavulanate (AUGMENTIN) 400-57 MG/5ML suspension 875 mg        875 mg Per Tube Every 12 hours 08/28/20 1152 08/30/20 2143   08/27/20 1500  vancomycin (VANCOCIN) IVPB 1000 mg/200 mL premix        1,000 mg 200 mL/hr over 60 Minutes Intravenous To Radiology 08/24/20 1644 08/27/20 1644   08/27/20 1000  anidulafungin (ERAXIS) 100 mg in sodium chloride 0.9 % 100 mL IVPB       See Hyperspace for full Linked Orders Report.   100 mg 78 mL/hr over 100 Minutes Intravenous Daily 08/26/20 1115 08/30/20 2359   08/26/20 1230  anidulafungin (ERAXIS) 200 mg in sodium chloride 0.9 % 200 mL IVPB       See Hyperspace for full Linked Orders Report.   200 mg 78 mL/hr over 200 Minutes Intravenous Once 08/26/20 1115 08/27/20 0008   08/26/20 1230  Ampicillin-Sulbactam (UNASYN) 3 g in sodium chloride 0.9 % 100 mL IVPB  Status:  Discontinued        3 g 200 mL/hr over 30 Minutes Intravenous Every 6  hours 08/26/20 1147 08/28/20 1152   08/24/20 1800  cefTRIAXone (ROCEPHIN) 2 g in sodium chloride 0.9 % 100 mL IVPB  Status:  Discontinued        2 g 200 mL/hr over 30 Minutes Intravenous Every 24 hours 08/24/20 1640 08/26/20 1147   08/24/20 1800  fluconazole (DIFLUCAN) IVPB 100 mg  Status:  Discontinued        100 mg 50 mL/hr over 60 Minutes Intravenous Every 24 hours 08/24/20 1640 08/26/20  1115   08/21/20 1100  metroNIDAZOLE (FLAGYL) tablet 500 mg  Status:  Discontinued        500 mg Oral Every 8 hours 08/21/20 1002 08/22/20 0744   08/19/20 1800  metroNIDAZOLE (FLAGYL) IVPB 500 mg  Status:  Discontinued        500 mg 100 mL/hr over 60 Minutes Intravenous Every 8 hours 08/19/20 1614 08/21/20 1002   08/17/20 0800  ciprofloxacin (CIPRO) tablet 500 mg        500 mg Oral 2 times daily 08/17/20 0411 08/21/20 2152   08/16/20 0600  cefOXitin (MEFOXIN) 2 g in sodium chloride 0.9 % 100 mL IVPB        2 g 200 mL/hr over 30 Minutes Intravenous To Radiology 08/14/20 1620 08/16/20 1704        I have personally reviewed the following labs and images: CBC: Recent Labs  Lab 08/27/20 1015 08/28/20 0501 08/29/20 0947 08/30/20 1036 08/31/20 0504 09/01/20 0450 09/02/20 0508  WBC 13.4*   < > 13.2* 12.8* 13.7* 14.2* 16.1*  NEUTROABS 10.5*  --   --   --   --   --  12.6*  HGB 10.3*   < > 11.1* 9.7* 9.7* 9.3* 9.2*  HCT 30.9*   < > 32.6* 29.5* 29.3* 27.5* 26.5*  MCV 92.2   < > 91.1 95.2 94.5 93.9 92.3  PLT 260   < > 341 321 311 314 249   < > = values in this interval not displayed.   BMP &GFR Recent Labs  Lab 08/29/20 0947 08/30/20 1036 08/31/20 0504 09/01/20 0450 09/02/20 0345  NA 152* 150* 147* 140 138  K 3.2* 3.7 3.8 3.7 3.6  CL 122* 121* 120* 113* 111  CO2 17* 22 20* 20* 20*  GLUCOSE 220* 126* 207* 178* 77  BUN '14 14 13 16 17  '$ CREATININE 0.62 0.66 0.60* 0.65 0.51*  CALCIUM 9.2 9.2 8.9 8.7* 8.8*  MG 2.3 2.2 2.2 2.1 2.3  PHOS 2.4* 2.5 2.7 2.8 3.1   Estimated Creatinine Clearance:  87 mL/min (A) (by C-G formula based on SCr of 0.51 mg/dL (L)). Liver & Pancreas: Recent Labs  Lab 08/29/20 0947 08/30/20 1036 08/31/20 0504 09/01/20 0450 09/02/20 0345  AST 169* 139* 128* 123* 130*  ALT 136* 127* 122* 116* 115*  ALKPHOS 165* 156* 151* 154* 155*  BILITOT 11.8* 9.8* 9.4* 8.9* 11.0*  PROT 6.7 6.3* 6.0* 5.9* 6.3*  ALBUMIN 2.6* 2.2* 2.3* 2.1* 2.4*   No results for input(s): LIPASE, AMYLASE in the last 168 hours. Recent Labs  Lab 09/02/20 0345  AMMONIA 42*   Diabetic: No results for input(s): HGBA1C in the last 72 hours.  Recent Labs  Lab 09/02/20 0342 09/02/20 0725 09/02/20 0808 09/02/20 1212 09/02/20 1625  GLUCAP 79 57* 95 147* 210*   Cardiac Enzymes: No results for input(s): CKTOTAL, CKMB, CKMBINDEX, TROPONINI in the last 168 hours. No results for input(s): PROBNP in the last 8760 hours. Coagulation Profile: Recent Labs  Lab 08/27/20 1015 09/02/20 0345  INR 1.2 1.4*   Thyroid Function Tests: No results for input(s): TSH, T4TOTAL, FREET4, T3FREE, THYROIDAB in the last 72 hours. Lipid Profile: No results for input(s): CHOL, HDL, LDLCALC, TRIG, CHOLHDL, LDLDIRECT in the last 72 hours. Anemia Panel: No results for input(s): VITAMINB12, FOLATE, FERRITIN, TIBC, IRON, RETICCTPCT in the last 72 hours. Urine analysis: No results found for: COLORURINE, APPEARANCEUR, LABSPEC, PHURINE, GLUCOSEU, HGBUR, BILIRUBINUR, KETONESUR, PROTEINUR, UROBILINOGEN, NITRITE, LEUKOCYTESUR Sepsis Labs: Invalid input(s): PROCALCITONIN, LACTICIDVEN  Microbiology: Recent Results (from the past 240 hour(s))  Culture, blood (routine x 2)     Status: None   Collection Time: 08/26/20  2:11 PM   Specimen: BLOOD  Result Value Ref Range Status   Specimen Description   Final    BLOOD BLOOD RIGHT HAND Performed at [facility] [address] [place], [place] [address]    Special Requests   Final    BOTTLES DRAWN AEROBIC ONLY Blood Culture adequate  volume Performed at [facility] [address] [place], [place] [address]    Culture   Final    NO GROWTH 5 DAYS Performed at [facility], [place] [address] [place], [place] [address]    Report Status 08/31/2020 FINAL  Final    Radiology Studies: CT ABDOMEN PELVIS W CONTRAST  Result Date: 09/02/2020 CLINICAL DATA:  Abdominal abscess/infection suspected. Right upper quadrant pain EXAM: CT ABDOMEN AND PELVIS WITH CONTRAST TECHNIQUE: Multidetector CT imaging of the abdomen and pelvis was performed using the standard protocol following bolus administration of intravenous contrast. CONTRAST:  43m OMNIPAQUE IOHEXOL 350 MG/ML SOLN COMPARISON:  07/27/2020 FINDINGS: Lower chest: Small bilateral pleural effusions. Bibasilar dependent atelectasis. Heart is normal size. Hepatobiliary: Percutaneous transpedicle biliary stent is in place terminating in the duodenum. There is pneumobilia. Gas also noted within the gallbladder. No visible suspicious focal hepatic abnormality. Pancreas: Again seen is the infiltrating mass in the region of the pancreatic head, not significantly changed since prior study. Pancreatic ductal dilatation, stable. Spleen: No focal abnormality.  Normal size. Adrenals/Urinary Tract: No adrenal abnormality. No focal renal abnormality. No stones or hydronephrosis. Urinary bladder is unremarkable. Stomach/Bowel: There is sigmoid diverticulosis. No active diverticulitis. Wall thickening noted in the cecum and ascending colon concerning for colitis. No evidence of bowel obstruction. Gastrostomy tube within the stomach. Vascular/Lymphatic: Aortic atherosclerosis. No aneurysm. Prominent upper abdominal lymph nodes. Portacaval lymph node with a short axis diameter of 12 mm again seen, not significantly changed. Mildly prominent central mesenteric lymph nodes are also stable. Reproductive: Prostate enlargement. Other: Moderate free fluid in the pelvis. Musculoskeletal: No acute  bony abnormality. IMPRESSION: Stable pancreatic head mass. Interval percutaneous transhepatic biliary drain placement. Decreasing biliary distension. Pneumobilia now present. Wall thickening noted within the right colon concerning for colitis. Sigmoid diverticulosis.  No active diverticulitis. Gastrostomy tube in the stomach. Small bilateral effusions, bibasilar atelectasis. Aortic atherosclerosis. Electronically Signed   By: KRolm BaptiseM.D.   On: 09/02/2020 01:17   DG ABD ACUTE 2+V W 1V CHEST  Result Date: 09/01/2020 CLINICAL DATA:  Fever EXAM: DG ABDOMEN ACUTE WITH 1 VIEW CHEST COMPARISON:  08/22/2020 FINDINGS: Percutaneous biliary stent noted in the right abdomen. Nonobstructive bowel gas pattern. No free air or suspicious calcification. Visualized lung bases clear. IMPRESSION: No acute findings. Electronically Signed   By: KRolm BaptiseM.D.   On: 09/01/2020 21:59      [place]wood [place]raft [place]. [place] If 7PM-7AM, please contact night-coverage [email or web address] 09/02/2020, 4:50 PM

## 2020-09-02 NOTE — Progress Notes (Signed)
Contacted by RN, noted confusion on "TID drain flushing order," as pt has bili drain and G tube.   Informed RN that IR is no longer following G tube unless complication occurs.   TID flushing is for bili drain only.  Please call IR for questions and concerns.    [name] Vanshika Jastrzebski PA-C 09/02/2020 9:28 PM

## 2020-09-02 NOTE — Progress Notes (Addendum)
Referring Physician(s): Dr. Hal Hope, A  Supervising Physician: Ruthann Cancer  Patient Status:  [facility] - In-pt  Chief Complaint:  Pancreatic cancer with malignant obstructive jaundice, intermittent nausea /vomiting, malnutrition, poor oral intake, duodenal stenosis S/p PBD placement on 08/16/2020, s/p conversion to external and internal biliary drain, s/p cholangiogram 08/23/2020 S/p G-tube placement 08/27/2020 S/p capping trial initiated on 08/31/2020  Subjective:  Patient laying in bed, not in acute distress. States that he is doing okay. No complaints regarding the biliary drain.  Allergies: Patient has no known allergies.  Medications: Prior to Admission medications   Medication Sig Start Date End Date Taking? Authorizing Provider  amLODipine (NORVASC) 5 MG tablet Take 5 mg by mouth daily. 06/19/20  Yes [provider]  aspirin 81 MG EC tablet Take 81 mg by mouth daily.   Yes [provider]  ferrous sulfate 325 (65 FE) MG tablet Take 325 mg by mouth daily with breakfast.   Yes [provider]  JARDIANCE 25 MG TABS tablet Take 25 mg by mouth daily. 07/23/20  Yes [provider]  loperamide (IMODIUM) 2 MG capsule Take by mouth as needed for diarrhea or loose stools.   Yes [provider]  metFORMIN (GLUCOPHAGE) 1000 MG tablet Take 1,000 mg by mouth 2 (two) times daily. 07/16/20  Yes [provider]  olmesartan (BENICAR) 40 MG tablet Take 40 mg by mouth daily. 06/25/20  Yes [provider]  omeprazole (PRILOSEC) 40 MG capsule Take 40 mg by mouth every morning. 03/01/20  Yes [provider]  ondansetron (ZOFRAN) 4 MG tablet Take 1 tablet (4 mg total) by mouth every 4 (four) hours as needed for nausea. 08/02/20  Yes Dayton Scrape A, NP  Oxycodone HCl 10 MG TABS Take 1 tablet (10 mg total) by mouth every 4 (four) hours as needed. Patient taking differently: Take 10 mg by mouth every 4 (four) hours as needed (pain).  08/02/20  Yes Dayton Scrape A, NP  pioglitazone (ACTOS) 45 MG tablet Take 45 mg by mouth daily. 06/25/20  Yes [provider]  rosuvastatin (CRESTOR) 5 MG tablet Take 5 mg by mouth once a week. 06/25/20  Yes [provider]     Vital Signs: BP (!) 152/56   Pulse (!) 109   Temp 98 F (36.7 C) (Oral)   Resp 15   Ht '5\' 10"'$  (1.778 m)   Wt 149 lb 4 oz (67.7 kg)   SpO2 100%   BMI 21.42 kg/m   Physical Exam Vitals reviewed.  Constitutional:      General: He is not in acute distress.    Appearance: Normal appearance. He is ill-appearing.  HENT:     Head: Normocephalic and atraumatic.  Pulmonary:     Effort: Pulmonary effort is normal.  Abdominal:     General: Abdomen is flat.     Palpations: Abdomen is soft.  Skin:    General: Skin is warm.     Comments: Scleral icterus noted  Positive RUQ drain to a gravity bag. Site is unremarkable with no erythema, edema, tenderness, bleeding or drainage. Suture and stat lock in place. Dressing is clean, dry, and intact.  20 ml of bile colored fluid noted in the the bag.   Neurological:     Mental Status: He is alert.    Imaging: CT ABDOMEN PELVIS W CONTRAST  Result Date: 09/02/2020 CLINICAL DATA:  Abdominal abscess/infection suspected. Right upper quadrant pain EXAM: CT ABDOMEN AND PELVIS WITH CONTRAST TECHNIQUE: Multidetector  CT imaging of the abdomen and pelvis was performed using the standard protocol following bolus administration of intravenous contrast. CONTRAST:  52m OMNIPAQUE IOHEXOL 350 MG/ML SOLN COMPARISON:  07/27/2020 FINDINGS: Lower chest: Small bilateral pleural effusions. Bibasilar dependent atelectasis. Heart is normal size. Hepatobiliary: Percutaneous transpedicle biliary stent is in place terminating in the duodenum. There is pneumobilia. Gas also noted within the gallbladder. No visible suspicious focal hepatic abnormality. Pancreas: Again seen is the infiltrating mass in the region of the pancreatic head, not  significantly changed since prior study. Pancreatic ductal dilatation, stable. Spleen: No focal abnormality.  Normal size. Adrenals/Urinary Tract: No adrenal abnormality. No focal renal abnormality. No stones or hydronephrosis. Urinary bladder is unremarkable. Stomach/Bowel: There is sigmoid diverticulosis. No active diverticulitis. Wall thickening noted in the cecum and ascending colon concerning for colitis. No evidence of bowel obstruction. Gastrostomy tube within the stomach. Vascular/Lymphatic: Aortic atherosclerosis. No aneurysm. Prominent upper abdominal lymph nodes. Portacaval lymph node with a short axis diameter of 12 mm again seen, not significantly changed. Mildly prominent central mesenteric lymph nodes are also stable. Reproductive: Prostate enlargement. Other: Moderate free fluid in the pelvis. Musculoskeletal: No acute bony abnormality. IMPRESSION: Stable pancreatic head mass. Interval percutaneous transhepatic biliary drain placement. Decreasing biliary distension. Pneumobilia now present. Wall thickening noted within the right colon concerning for colitis. Sigmoid diverticulosis.  No active diverticulitis. Gastrostomy tube in the stomach. Small bilateral effusions, bibasilar atelectasis. Aortic atherosclerosis. Electronically Signed   By: KRolm BaptiseM.D.   On: 09/02/2020 01:17   DG ABD ACUTE 2+V W 1V CHEST  Result Date: 09/01/2020 CLINICAL DATA:  Fever EXAM: DG ABDOMEN ACUTE WITH 1 VIEW CHEST COMPARISON:  08/22/2020 FINDINGS: Percutaneous biliary stent noted in the right abdomen. Nonobstructive bowel gas pattern. No free air or suspicious calcification. Visualized lung bases clear. IMPRESSION: No acute findings. Electronically Signed   By: KRolm BaptiseM.D.   On: 09/01/2020 21:59    Labs:  CBC: Recent Labs    08/30/20 1036 08/31/20 0504 09/01/20 0450 09/02/20 0508  WBC 12.8* 13.7* 14.2* 16.1*  HGB 9.7* 9.7* 9.3* 9.2*  HCT 29.5* 29.3* 27.5* 26.5*  PLT 321 311 314 249     COAGS: Recent Labs    08/15/20 0452 08/16/20 0553 08/27/20 1015 09/02/20 0345  INR 1.5* 1.1 1.2 1.4*    BMP: Recent Labs    08/30/20 1036 08/31/20 0504 09/01/20 0450 09/02/20 0345  NA 150* 147* 140 138  K 3.7 3.8 3.7 3.6  CL 121* 120* 113* 111  CO2 22 20* 20* 20*  GLUCOSE 126* 207* 178* 77  BUN '14 13 16 17  '$ CALCIUM 9.2 8.9 8.7* 8.8*  CREATININE 0.66 0.60* 0.65 0.51*  GFRNONAA >60 >60 >60 >60    LIVER FUNCTION TESTS: Recent Labs    08/30/20 1036 08/31/20 0504 09/01/20 0450 09/02/20 0345  BILITOT 9.8* 9.4* 8.9* 11.0*  AST 139* 128* 123* 130*  ALT 127* 122* 116* 115*  ALKPHOS 156* 151* 154* 155*  PROT 6.3* 6.0* 5.9* 6.3*  ALBUMIN 2.2* 2.3* 2.1* 2.4*    Assessment and Plan:  65year old male w/ history of pancreatic cancer with malignant biliary obstruction, status post external biliary drain placement on 7/21 and conversion to internal and external biliary drain on 7/22.  A capping trial was initiated on 08/31/2020.   Chart reviewed this morning, noticed increasing T bili and worsening leukocytosis. Discussed with Dr. SSerafina Royals plan was to discontinue capping trial. At the time of assessment, the drain was already attached to a  gravity bag.   Drain intact. Puncture site unremarkable, no s/s of bleeding or infection.  OP 70 cc bile VSS WBC 16.1 today (14.2 yesterday) AST 130, ALT 115, T bili 11.0 (AST 123, ALT 116, T bili 8.9 yesterday)   Continue with flushing TID, output recording q shift and dressing changes as needed.    Further treatment plan per [facility]/ Appreciate and agree with the plan.  IR to follow.    Electronically Signed: Tera Mater, PA-C 09/02/2020, 3:41 PM   I spent a total of 15 Minutes at the the patient's bedside AND on the patient's hospital floor or unit, greater than 50% of which was counseling/coordinating care for biliary drain.

## 2020-09-02 NOTE — Progress Notes (Addendum)
Pharmacy Antibiotic Note  Jeff Jordan is a 65 y.o. male admitted on 08/14/2020 with PMH of pancreatic adenocarcinoma, DM-2, HTN and FTT presenting for biliary obstruction due to pancreatic adenocarcinoma after he failed biliary stent placement by ERCP at GI office on 7/19 due to duodenal stenosis.  He had percutaneous drain placed on 7/21 that was converted to internal and external biliary drain on 7/22.  He underwent cholangiogram on 7/28.  Biliary fluid with Candida glabrata and staph epidermis.  Started on Diflucan and ceftriaxone on 7/29 which were since changed to Eraxis and IV Unasyn>Augmentin, and he completed 5 days course on 8/4.  Today, Pharmacy has been consulted for Unasyn dosing for developing sepsis.  Plan: Unasyn 3 g IV every 6 hours Monitor clinical picture, renal function F/U C&S, abx deescalation / LOT   Height: '5\' 10"'$  (177.8 cm) Weight: 67.7 kg (149 lb 4 oz) IBW/kg (Calculated) : 73  Temp (24hrs), Avg:99.2 F (37.3 C), Min:98 F (36.7 C), Max:100.7 F (38.2 C)  Recent Labs  Lab 08/29/20 0947 08/30/20 1036 08/31/20 0504 09/01/20 0450 09/01/20 2247 09/02/20 0345 09/02/20 0508  WBC 13.2* 12.8* 13.7* 14.2*  --   --  16.1*  CREATININE 0.62 0.66 0.60* 0.65  --  0.51*  --   LATICACIDVEN  --   --   --   --  1.6 2.0*  --     Estimated Creatinine Clearance: 87 mL/min (A) (by C-G formula based on SCr of 0.51 mg/dL (L)).    No Known Allergies   New Microbiology results: 8/6 BCx: sent   Thank you for allowing pharmacy to be a part of this patient's care.  Jeff Jordan P. Legrand Como, PharmD, [place] Please utilize Amion for appropriate phone number to reach the unit pharmacist ([place]) 09/02/2020 3:28 PM

## 2020-09-03 DIAGNOSIS — K831 Obstruction of bile duct: Secondary | ICD-10-CM | POA: Diagnosis not present

## 2020-09-03 DIAGNOSIS — C259 Malignant neoplasm of pancreas, unspecified: Secondary | ICD-10-CM | POA: Diagnosis not present

## 2020-09-03 DIAGNOSIS — R509 Fever, unspecified: Secondary | ICD-10-CM | POA: Diagnosis not present

## 2020-09-03 DIAGNOSIS — R112 Nausea with vomiting, unspecified: Secondary | ICD-10-CM | POA: Diagnosis not present

## 2020-09-03 DIAGNOSIS — E440 Moderate protein-calorie malnutrition: Secondary | ICD-10-CM | POA: Diagnosis not present

## 2020-09-03 DIAGNOSIS — K8309 Other cholangitis: Secondary | ICD-10-CM | POA: Diagnosis not present

## 2020-09-03 LAB — PHOSPHORUS: Phosphorus: 2.3 mg/dL — ABNORMAL LOW (ref 2.5–4.6)

## 2020-09-03 LAB — CBC WITH DIFFERENTIAL/PLATELET
Band Neutrophils: 0 %
Basophils Relative: 2 %
Blasts: NONE SEEN %
Eosinophils Relative: 1 %
HCT: 27 % — ABNORMAL LOW (ref 39.0–52.0)
Hemoglobin: 9.2 g/dL — ABNORMAL LOW (ref 13.0–17.0)
Lymphocytes Relative: 13 %
MCH: 31.6 pg (ref 26.0–34.0)
MCHC: 34.1 g/dL (ref 30.0–36.0)
MCV: 92.8 fL (ref 80.0–100.0)
Metamyelocytes Relative: NONE SEEN %
Monocytes Relative: 4 %
Myelocytes: NONE SEEN %
Neutrophils Relative %: 80 %
Platelets: 305 10*3/uL (ref 150–400)
Promyelocytes Relative: NONE SEEN %
RBC Morphology: NORMAL
RBC: 2.91 MIL/uL — ABNORMAL LOW (ref 4.22–5.81)
RDW: 21.2 % — ABNORMAL HIGH (ref 11.5–15.5)
WBC Morphology: NORMAL
WBC: 14.2 10*3/uL — ABNORMAL HIGH (ref 4.0–10.5)
nRBC: 0 % (ref 0.0–0.2)
nRBC: 0 /100 WBC

## 2020-09-03 LAB — MAGNESIUM: Magnesium: 2.1 mg/dL (ref 1.7–2.4)

## 2020-09-03 LAB — COMPREHENSIVE METABOLIC PANEL
ALT: 99 U/L — ABNORMAL HIGH (ref 0–44)
AST: 118 U/L — ABNORMAL HIGH (ref 15–41)
Albumin: 1.9 g/dL — ABNORMAL LOW (ref 3.5–5.0)
Alkaline Phosphatase: 169 U/L — ABNORMAL HIGH (ref 38–126)
Anion gap: 9 (ref 5–15)
BUN: 18 mg/dL (ref 8–23)
CO2: 19 mmol/L — ABNORMAL LOW (ref 22–32)
Calcium: 8.6 mg/dL — ABNORMAL LOW (ref 8.9–10.3)
Chloride: 109 mmol/L (ref 98–111)
Creatinine, Ser: 0.64 mg/dL (ref 0.61–1.24)
GFR, Estimated: >60 mL/min (ref >60)
Glucose, Bld: 193 mg/dL — ABNORMAL HIGH (ref 70–99)
Potassium: 3.3 mmol/L — ABNORMAL LOW (ref 3.5–5.1)
Sodium: 137 mmol/L (ref 135–145)
Total Bilirubin: 10.6 mg/dL — ABNORMAL HIGH (ref 0.3–1.2)
Total Protein: 5.9 g/dL — ABNORMAL LOW (ref 6.5–8.1)

## 2020-09-03 LAB — LACTIC ACID, PLASMA
Lactic Acid, Venous: 2 mmol/L (ref 0.5–1.9)
Lactic Acid, Venous: 1.5 mmol/L (ref 0.5–1.9)

## 2020-09-03 LAB — GLUCOSE, CAPILLARY
Glucose-Capillary: 141 mg/dL — ABNORMAL HIGH (ref 70–99)
Glucose-Capillary: 158 mg/dL — ABNORMAL HIGH (ref 70–99)
Glucose-Capillary: 164 mg/dL — ABNORMAL HIGH (ref 70–99)
Glucose-Capillary: 199 mg/dL — ABNORMAL HIGH (ref 70–99)
Glucose-Capillary: 202 mg/dL — ABNORMAL HIGH (ref 70–99)

## 2020-09-03 LAB — LIPASE, BLOOD: Lipase: 55 U/L — ABNORMAL HIGH (ref 11–51)

## 2020-09-03 MED ORDER — POTASSIUM CHLORIDE 10 MEQ/100ML IV SOLN
10.0000 meq | Freq: Once | INTRAVENOUS | Status: AC
  Administered 2020-09-03: 10 meq via INTRAVENOUS
  Filled 2020-09-03: qty 100

## 2020-09-03 MED ORDER — DIPHENOXYLATE-ATROPINE 2.5-0.025 MG PO TABS
1.0000 | ORAL_TABLET | Freq: Four times a day (QID) | ORAL | Status: DC
  Administered 2020-09-03 – 2020-09-06 (×12): 1 via ORAL
  Filled 2020-09-03 (×12): qty 1

## 2020-09-03 MED ORDER — DIPHENOXYLATE-ATROPINE 2.5-0.025 MG/5ML PO LIQD: 5.0000 mL | Freq: Four times a day (QID) | ORAL | Status: DC

## 2020-09-03 MED ORDER — CARVEDILOL 6.25 MG PO TABS
6.2500 mg | ORAL_TABLET | Freq: Two times a day (BID) | ORAL | Status: DC
  Administered 2020-09-03 – 2020-09-06 (×7): 6.25 mg via ORAL
  Filled 2020-09-03 (×7): qty 1

## 2020-09-03 MED ORDER — LACTATED RINGERS IV BOLUS
500.0000 mL | Freq: Once | INTRAVENOUS | Status: AC
  Administered 2020-09-03: 500 mL via INTRAVENOUS

## 2020-09-03 MED ORDER — POTASSIUM CHLORIDE 20 MEQ PO PACK
40.0000 meq | PACK | ORAL | Status: AC
  Administered 2020-09-03 (×2): 40 meq
  Filled 2020-09-03 (×2): qty 2

## 2020-09-03 NOTE — Progress Notes (Signed)
Patient ID: Jeff Jordan, male   DOB: 03-Feb-1955, 65 y.o.   MRN: [id number]     Supervising Physician: Ruthann Cancer  Patient Status:  [facility] - In-pt  Chief Complaint:  Hx of pancreatic cancer, DM II, HTN and FTT. Bilary drain placed post sepsis/cholangitis 08/16/2020. G-tube placed 08/27/2020   Subjective:  Pt sitting up in chair. He answers questions appropriately. He is pleasant, calm and cooperative.  Pt reports mild R side abd pain in area of biliary drain.  Allergies: Patient has no known allergies.  Medications: Prior to Admission medications   Medication Sig Start Date End Date Taking? Authorizing Provider  amLODipine (NORVASC) 5 MG tablet Take 5 mg by mouth daily. 06/19/20  Yes [provider]  aspirin 81 MG EC tablet Take 81 mg by mouth daily.   Yes [provider]  ferrous sulfate 325 (65 FE) MG tablet Take 325 mg by mouth daily with breakfast.   Yes [provider]  JARDIANCE 25 MG TABS tablet Take 25 mg by mouth daily. 07/23/20  Yes [provider]  loperamide (IMODIUM) 2 MG capsule Take by mouth as needed for diarrhea or loose stools.   Yes [provider]  metFORMIN (GLUCOPHAGE) 1000 MG tablet Take 1,000 mg by mouth 2 (two) times daily. 07/16/20  Yes [provider]  olmesartan (BENICAR) 40 MG tablet Take 40 mg by mouth daily. 06/25/20  Yes [provider]  omeprazole (PRILOSEC) 40 MG capsule Take 40 mg by mouth every morning. 03/01/20  Yes [provider]  ondansetron (ZOFRAN) 4 MG tablet Take 1 tablet (4 mg total) by mouth every 4 (four) hours as needed for nausea. 08/02/20  Yes Dayton Scrape A, NP  Oxycodone HCl 10 MG TABS Take 1 tablet (10 mg total) by mouth every 4 (four) hours as needed. Patient taking differently: Take 10 mg by mouth every 4 (four) hours as needed (pain). 08/02/20  Yes Dayton Scrape A, NP  pioglitazone (ACTOS) 45 MG tablet Take 45 mg by mouth daily. 06/25/20  Yes [provider]  rosuvastatin (CRESTOR) 5 MG tablet Take 5 mg by mouth once a week. 06/25/20  Yes [provider]     Vital Signs: BP (!) 121/52 (BP Location: Left Arm)   Pulse (!) 105   Temp 98.3 F (36.8 C) (Oral)   Resp 19   Ht '5\' 10"'$  (1.778 m)   Wt 163 lb 12.8 oz (74.3 kg)   SpO2 100%   BMI 23.50 kg/m   Physical Exam Eyes:     General: Scleral icterus present.  Pulmonary:     Effort: Pulmonary effort is normal.  Abdominal:     Comments: Biliary drain in place. Site C/D/I with no erythema, d/c or bleeding. Dressing is clean and dry. Cap that was placed 08/31/2020 was removed, tube flushed easily and reconnected to bag. There was scant amount of bilious fluid in bag w/ 30 cc OP documented in Epic.   G-tube intact. Site and dressing C/D/I. Pt receiving tube feed at the time of assessment.   Neurological:     Mental Status: He is alert.  Psychiatric:        Mood and Affect: Mood normal.        Behavior: Behavior normal.        Thought Content: Thought content normal.    Imaging: CT ABDOMEN PELVIS W CONTRAST  Result Date: 09/02/2020 CLINICAL DATA:  Abdominal abscess/infection suspected. Right upper quadrant pain EXAM: CT ABDOMEN AND PELVIS  WITH CONTRAST TECHNIQUE: Multidetector CT imaging of the abdomen and pelvis was performed using the standard protocol following bolus administration of intravenous contrast. CONTRAST:  30m OMNIPAQUE IOHEXOL 350 MG/ML SOLN COMPARISON:  07/27/2020 FINDINGS: Lower chest: Small bilateral pleural effusions. Bibasilar dependent atelectasis. Heart is normal size. Hepatobiliary: Percutaneous transpedicle biliary stent is in place terminating in the duodenum. There is pneumobilia. Gas also noted within the gallbladder. No visible suspicious focal hepatic abnormality. Pancreas: Again seen is the infiltrating mass in the region of the pancreatic head, not significantly changed since prior study. Pancreatic ductal dilatation, stable. Spleen: No focal  abnormality.  Normal size. Adrenals/Urinary Tract: No adrenal abnormality. No focal renal abnormality. No stones or hydronephrosis. Urinary bladder is unremarkable. Stomach/Bowel: There is sigmoid diverticulosis. No active diverticulitis. Wall thickening noted in the cecum and ascending colon concerning for colitis. No evidence of bowel obstruction. Gastrostomy tube within the stomach. Vascular/Lymphatic: Aortic atherosclerosis. No aneurysm. Prominent upper abdominal lymph nodes. Portacaval lymph node with a short axis diameter of 12 mm again seen, not significantly changed. Mildly prominent central mesenteric lymph nodes are also stable. Reproductive: Prostate enlargement. Other: Moderate free fluid in the pelvis. Musculoskeletal: No acute bony abnormality. IMPRESSION: Stable pancreatic head mass. Interval percutaneous transhepatic biliary drain placement. Decreasing biliary distension. Pneumobilia now present. Wall thickening noted within the right colon concerning for colitis. Sigmoid diverticulosis.  No active diverticulitis. Gastrostomy tube in the stomach. Small bilateral effusions, bibasilar atelectasis. Aortic atherosclerosis. Electronically Signed   By: KRolm BaptiseM.D.   On: 09/02/2020 01:17   DG ABD ACUTE 2+V W 1V CHEST  Result Date: 09/01/2020 CLINICAL DATA:  Fever EXAM: DG ABDOMEN ACUTE WITH 1 VIEW CHEST COMPARISON:  08/22/2020 FINDINGS: Percutaneous biliary stent noted in the right abdomen. Nonobstructive bowel gas pattern. No free air or suspicious calcification. Visualized lung bases clear. IMPRESSION: No acute findings. Electronically Signed   By: KRolm BaptiseM.D.   On: 09/01/2020 21:59    Labs:  CBC: Recent Labs    08/31/20 0504 09/01/20 0450 09/02/20 0508 09/03/20 0056  WBC 13.7* 14.2* 16.1* 14.2*  HGB 9.7* 9.3* 9.2* 9.2*  HCT 29.3* 27.5* 26.5* 27.0*  PLT 311 314 249 305    COAGS: Recent Labs    08/15/20 0452 08/16/20 0553 08/27/20 1015 09/02/20 0345  INR 1.5* 1.1 1.2  1.4*    BMP: Recent Labs    08/31/20 0504 09/01/20 0450 09/02/20 0345 09/03/20 0056  NA 147* 140 138 137  K 3.8 3.7 3.6 3.3*  CL 120* 113* 111 109  CO2 20* 20* 20* 19*  GLUCOSE 207* 178* 77 193*  BUN '13 16 17 18  '$ CALCIUM 8.9 8.7* 8.8* 8.6*  CREATININE 0.60* 0.65 0.51* 0.64  GFRNONAA >60 >60 >60 >60    LIVER FUNCTION TESTS: Recent Labs    08/31/20 0504 09/01/20 0450 09/02/20 0345 09/03/20 0056  BILITOT 9.4* 8.9* 11.0* 10.6*  AST 128* 123* 130* 118*  ALT 122* 116* 115* 99*  ALKPHOS 151* 154* 155* 169*  PROT 6.0* 5.9* 6.3* 5.9*  ALBUMIN 2.3* 2.1* 2.4* 1.9*    Assessment and Plan: Pt has hx of pancreatic cancer, DM II, HTN and FTT. Bilary drain placed post sepsis/cholangitis 08/23/2020. G-tube placed 08/27/2020. Capping trial initiated 08/31/2020. Cap removed today, flushed w/ NS and reconnected to drainage bag to gravity. Scant amt of bilious fluid in bag w/ 30cc recorded OP in Epic.   WBC 14.2 from 16.1. Pt afebrile.  AST 118 from 130 ALT 99 from 115 Tbili 10.6  from 11.0 Orders to continue flushing TID, recording OP q shift and change dressing as needed.  IR will continue to follow as needed.    Electronically Signed: Tyson Alias, NP 09/03/2020, 2:59 PM   I spent a total of 20 minutes at the the patient's bedside AND on the patient's hospital floor or unit, greater than 50% of which was counseling/coordinating care for biliary drain and G tube assessment.

## 2020-09-03 NOTE — Progress Notes (Addendum)
Received report from RN at 1500.  Condom cath output and bili drain output emptied & recorded.  Pt OOB in chair, comfortable with no needs at this time.   TF to be replaced before shift change.   Daughter at bedside

## 2020-09-03 NOTE — Progress Notes (Signed)
= PROGRESS NOTE  Jeff Jordan [id number] DOB: [birth year]   PCP: Georganna Skeans, PA-C  Patient is from: Home.  Lives alone.  However, daughter lives close by.  DOA: 08/14/2020 LOS: 19  Chief complaints:  No chief complaint on file.    Brief Narrative / Interim history: 65 year old M with PMH of pancreatic adenocarcinoma, DM-2, HTN and FTT presenting for biliary obstruction due to pancreatic adenocarcinoma after he failed biliary stent placement by ERCP at GI office on 7/19 due to duodenal stenosis.  He had percutaneous drain placed on 7/21 that was converted to internal and external biliary drain on 7/22.  He underwent cholangiogram on 7/28.  Biliary fluid with Candida glabrata and staph epidermis.  Started on Diflucan and ceftriaxone on 7/29 which were since changed to Eraxis and IV Unasyn, and he completed 5 days course on 8/4.  LFT and bilirubin improving.  G-tube placed by IR on 8/1 after unsuccessful attempt for jejunostomy tube due to inability to cannulate the pylorus.  Tube feeds restarted and titrated to goal rate.  He is also on clear liquid diet.  IR started drain capping but patient spiked fever with rising leukocytosis and tachycardia.  Repeat CT abdomen and pelvis with stable pancreatic head mass, decreased biliary distention, new pneumobilia and possible right colitis.  Blood cultures obtained.  ID resumed IV Unasyn on 8/7.  Biliary drain uncapped and to gravity.   Subjective: Seen and examined earlier this morning.  No major events overnight of this morning.  No further fever but mild tenderness to 100.3.  Mild tachycardia.  BP stable.  Continues to endorse intermittent abdominal pain that improves with pain medication.  Also reports about 5 episodes of diarrhea that he describes as loose stool.  Denies chest pain or dyspnea.  Denies UTI symptoms.  Objective: Vitals:   09/03/20 0405 09/03/20 0500 09/03/20 0828 09/03/20 1041  BP: (!) 170/74  (!) 131/57 (!) 133/55  Pulse: (!)  112  (!) 106 (!) 101  Resp: (!) 25  (!) 24 20  Temp: 98.8 F (37.1 C)  98.7 F (37.1 C) 98.4 F (36.9 C)  TempSrc: Oral  Oral Oral  SpO2: 100%  100% 100%  Weight:  74.3 kg    Height:        Intake/Output Summary (Last 24 hours) at 09/03/2020 1340 Last data filed at 09/03/2020 1016 Gross per 24 hour  Intake 1018.54 ml  Output 1663 ml  Net -644.46 ml   Filed Weights   09/01/20 0500 09/02/20 0500 09/03/20 0500  Weight: 67.6 kg 67.7 kg 74.3 kg    Examination:  GENERAL: Frail and chronically ill-appearing.  Nontoxic. HEENT: MMM.  Sclerae icteric. NECK: Supple.  No apparent JVD.  RESP: On RA.  No IWOB.  Fair aeration bilaterally. CVS: Slightly tachycardic to 110.  Regular rhythm.. Heart sounds normal.  ABD/GI/GU: BS+. Abd soft.  Mild diffuse tenderness.  No rebound or guarding.  G-tube.  Biliary drain. MSK/EXT:  Moves extremities. No apparent deformity. No edema.  SKIN: Skin jaundice. NEURO: Awake and alert. Oriented appropriately.  No apparent focal neuro deficit. PSYCH: Calm. Normal affect.   Procedures:  -7/19-outpatient biliary stent placement unsuccessful -7/21-percutaneous drain placement -7/22-conversion of external biliary drain to internal and external biliary drain  -7/28-IR cholangiogram on 7/28 -8/01-G-tube placed by IR   Microbiology summarized: 7/19-COVID-19 PCR nonreactive. 7/27-biliary culture with staph epidermis and Candida glabrata 8/06-blood culture pending.  Assessment & Plan: Biliary obstruction due to pancreatic adenocarcinoma Elevated liver enzymes/obstructive jaundice-LFT stable.  Bilirubin increased. Biliary infection/cholangitis in the setting of the above Recent Labs  Lab 08/30/20 1036 08/31/20 0504 09/01/20 0450 09/02/20 0345 09/03/20 0056  AST 139* 128* 123* 130* 118*  ALT 127* 122* 116* 115* 99*  ALKPHOS 156* 151* 154* 155* 169*  BILITOT 9.8* 9.4* 8.9* 11.0* 10.6*  PROT 6.3* 6.0* 5.9* 6.3* 5.9*  ALBUMIN 2.2* 2.3* 2.1* 2.4* 1.9*    -Outpatient ERCP and biliary stent placement on 7/19 unsuccessful due to duodenal stenosis. -PERC biliary drain on 7/21, conversion to internal and external biliary drain on 7/22, IR cholangiogram on 7/28 -Biliary fluid culture with Candida glabrata and staph epidermis.  Staph epidermis likely contaminant.   -Completed 5 days of Eraxis and IV Unasyn, then Augmentin on 8/4. -Febrile, tachycardic with rising leukocytosis on 8/6 after capping trial.  Now biliary drain to gravity -CT abdomen and pelvis concerning for new pneumobilia and right-sided colitis. -Appreciate help by ID:  -Continue IV Unasyn  -Follow repeat blood culture -Has referral to oncology office in [place] -Pain controlled with p.o. medications -Hold home Crestor.  Diarrhea/abdominal pain: Likely due to tube feed but CT raises concern for right colitis.  Mild diffuse tenderness/discomfort on exam.  No rebound or guarding.  CT face on differential but improvement in leukocytosis and fever with IV Unasyn argues against this. -Imodium and Lomotil per tube as needed.  Gastric outlet obstruction -G-tube by IR on 8/1 after unsuccessful attempt for jejunostomy tube due to inability to cannulate the pylorus.   -Tolerating clear liquid diet as well. Not sure if we can advance beyond this with his pyloric stenosis.  Chest pain: Likely due to underlying malignancy.  EKG, troponin, BNP and x-ray reassuring.  Resolved.    Hypernatremia likely hypovolemic. Na 150>> > 137. -Continue FW arts 200 cc every 4 hours -Continue Avapro-increase to 300 mg for BP -Encourage p.o. water -Recheck in the morning  Controlled NIDDM-2 with hyperglycemia and hyperglycemia: On Jardiance, metformin and Actos at home.  A1c 5.3%. Recent Labs  Lab 09/02/20 2001 09/03/20 0003 09/03/20 0408 09/03/20 0756 09/03/20 1202  GLUCAP 144* 202* 158* 199* 164*  -Levemir 32 units twice daily -Continue SSI-moderate -NovoLog 5 units every 4 hours for TF  coverage -Continue holding home Crestor with elevated LFT -May need insulin on discharge for tube feed coverage.  I do not think his p.o. meds are ideal in his situation.  Normocytic anemia/anemia of chronic disease: H&H relatively stable. Recent Labs    08/25/20 0718 08/26/20 0720 08/27/20 1015 08/28/20 0501 08/29/20 0947 08/30/20 1036 08/31/20 0504 09/01/20 0450 09/02/20 0508 09/03/20 0056  HGB 10.1* 10.2* 10.3* 10.8* 11.1* 9.7* 9.7* 9.3* 9.2* 9.2*  -Continue monitoring  Essential hypertension: BP slightly elevated. -Continue amlodipine 10 mg daily -Continue Avapro to 300 mg daily -Add Coreg 6.25 mg twice daily. -Continue hydralazine IV as needed  Hypokalemia/hypophosphatemia: K 3.3.  Resolved. -K-Dur 40 mill equivalent x2  Metabolic acidosis: Likely due to diarrhea.  Improving -Manage diarrhea as above.   HLD: Continue hold Crestor in the setting of elevated LFT.   GERD: Protonix 40 mg IV daily   Thrush: Nystatin swish and swallow.  He is also on Eraxis now.  Generalized weakness/debility -PT/OT eval and therapy  Leukocytosis/bandemia: WBC rising. -Continue monitoring  Goal of care counseling: Significant comorbidity as above.  Grim prognosis -We will have goal of care discussion in the morning -Palliative consult  Adult failure to thrive/moderate malnutrition-poor p.o. intake in the setting of adenocarcinoma. Body mass index is 23.5 kg/m.  Nutrition Problem: Moderate Malnutrition Etiology: chronic illness, cancer and cancer related treatments Signs/Symptoms: mild fat depletion, mild muscle depletion, moderate muscle depletion Interventions: Tube feeding   DVT prophylaxis:  SCDs Start: 08/14/20 1624  Code Status: Full code Family Communication: Patient and/or RN.  None at bedside today Level of care: Telemetry Status is: Inpatient  Remains inpatient appropriate because:Ongoing diagnostic testing needed not appropriate for outpatient work up, IV  treatments appropriate due to intensity of illness or inability to take PO, and Inpatient level of care appropriate due to severity of illness  Dispo: The patient is from: Home              Anticipated d/c is to: Home              Patient currently is not medically stable to d/c.   Difficult to place patient No       Consultants:  GI IR ID   Sch Meds:  Scheduled Meds:  amLODipine  10 mg Oral Daily   carvedilol  6.25 mg Oral BID   cholestyramine light  4 g Oral BID   diphenoxylate-atropine  1 tablet Oral QID   feeding supplement  1 Container Oral TID BM   feeding supplement (PROSource TF)  45 mL Per Tube TID   ferrous sulfate  325 mg Oral Q breakfast   free water  200 mL Per Tube Q4H   insulin aspart  0-15 Units Subcutaneous TID WC   insulin aspart  0-5 Units Subcutaneous QHS   insulin aspart  4 Units Subcutaneous Q4H   insulin detemir  30 Units Subcutaneous BID   irbesartan  300 mg Oral Daily   pantoprazole  40 mg Intravenous Q1400   potassium chloride  40 mEq Per Tube Q4H   sodium chloride flush  10-40 mL Intracatheter Q12H   sodium chloride flush  5 mL Intracatheter Q8H   sodium chloride flush  5 mL Intracatheter Q8H   Continuous Infusions:  ampicillin-sulbactam (UNASYN) IV 3 g (09/03/20 1218)   feeding supplement (OSMOLITE 1.5 CAL) 55 mL/hr at 09/02/20 2200   PRN Meds:.doxylamine (Sleep), labetalol, loperamide HCl, morphine injection, ondansetron (ZOFRAN) IV, oxyCODONE, sodium chloride flush  Antimicrobials: Anti-infectives (From admission, onward)    Start     Dose/Rate Route Frequency Ordered Stop   09/02/20 1600  Ampicillin-Sulbactam (UNASYN) 3 g in sodium chloride 0.9 % 100 mL IVPB        3 g 200 mL/hr over 30 Minutes Intravenous Every 6 hours 09/02/20 1520 09/06/20 2359   08/28/20 1300  amoxicillin-clavulanate (AUGMENTIN) 400-57 MG/5ML suspension 875 mg        875 mg Per Tube Every 12 hours 08/28/20 1152 08/30/20 2143   08/27/20 1500  vancomycin  (VANCOCIN) IVPB 1000 mg/200 mL premix        1,000 mg 200 mL/hr over 60 Minutes Intravenous To Radiology 08/24/20 1644 08/27/20 1644   08/27/20 1000  anidulafungin (ERAXIS) 100 mg in sodium chloride 0.9 % 100 mL IVPB       See Hyperspace for full Linked Orders Report.   100 mg 78 mL/hr over 100 Minutes Intravenous Daily 08/26/20 1115 08/30/20 2359   08/26/20 1230  anidulafungin (ERAXIS) 200 mg in sodium chloride 0.9 % 200 mL IVPB       See Hyperspace for full Linked Orders Report.   200 mg 78 mL/hr over 200 Minutes Intravenous Once 08/26/20 1115 08/27/20 0008   08/26/20 1230  Ampicillin-Sulbactam (UNASYN) 3 g in sodium chloride 0.9 %  100 mL IVPB  Status:  Discontinued        3 g 200 mL/hr over 30 Minutes Intravenous Every 6 hours 08/26/20 1147 08/28/20 1152   08/24/20 1800  cefTRIAXone (ROCEPHIN) 2 g in sodium chloride 0.9 % 100 mL IVPB  Status:  Discontinued        2 g 200 mL/hr over 30 Minutes Intravenous Every 24 hours 08/24/20 1640 08/26/20 1147   08/24/20 1800  fluconazole (DIFLUCAN) IVPB 100 mg  Status:  Discontinued        100 mg 50 mL/hr over 60 Minutes Intravenous Every 24 hours 08/24/20 1640 08/26/20 1115   08/21/20 1100  metroNIDAZOLE (FLAGYL) tablet 500 mg  Status:  Discontinued        500 mg Oral Every 8 hours 08/21/20 1002 08/22/20 0744   08/19/20 1800  metroNIDAZOLE (FLAGYL) IVPB 500 mg  Status:  Discontinued        500 mg 100 mL/hr over 60 Minutes Intravenous Every 8 hours 08/19/20 1614 08/21/20 1002   08/17/20 0800  ciprofloxacin (CIPRO) tablet 500 mg        500 mg Oral 2 times daily 08/17/20 0411 08/21/20 2152   08/16/20 0600  cefOXitin (MEFOXIN) 2 g in sodium chloride 0.9 % 100 mL IVPB        2 g 200 mL/hr over 30 Minutes Intravenous To Radiology 08/14/20 1620 08/16/20 1704        I have personally reviewed the following labs and images: CBC: Recent Labs  Lab 08/30/20 1036 08/31/20 0504 09/01/20 0450 09/02/20 0508 09/03/20 0056  WBC 12.8* 13.7* 14.2*  16.1* 14.2*  NEUTROABS  --   --   --  12.6*  --   HGB 9.7* 9.7* 9.3* 9.2* 9.2*  HCT 29.5* 29.3* 27.5* 26.5* 27.0*  MCV 95.2 94.5 93.9 92.3 92.8  PLT 321 311 314 249 305   BMP &GFR Recent Labs  Lab 08/30/20 1036 08/31/20 0504 09/01/20 0450 09/02/20 0345 09/03/20 0056  NA 150* 147* 140 138 137  K 3.7 3.8 3.7 3.6 3.3*  CL 121* 120* 113* 111 109  CO2 22 20* 20* 20* 19*  GLUCOSE 126* 207* 178* 77 193*  BUN '14 13 16 17 18  '$ CREATININE 0.66 0.60* 0.65 0.51* 0.64  CALCIUM 9.2 8.9 8.7* 8.8* 8.6*  MG 2.2 2.2 2.1 2.3 2.1  PHOS 2.5 2.7 2.8 3.1 2.3*   Estimated Creatinine Clearance: 93.8 mL/min (by C-G formula based on SCr of 0.64 mg/dL). Liver & Pancreas: Recent Labs  Lab 08/30/20 1036 08/31/20 0504 09/01/20 0450 09/02/20 0345 09/03/20 0056  AST 139* 128* 123* 130* 118*  ALT 127* 122* 116* 115* 99*  ALKPHOS 156* 151* 154* 155* 169*  BILITOT 9.8* 9.4* 8.9* 11.0* 10.6*  PROT 6.3* 6.0* 5.9* 6.3* 5.9*  ALBUMIN 2.2* 2.3* 2.1* 2.4* 1.9*   Recent Labs  Lab 09/03/20 0056  LIPASE 55*   Recent Labs  Lab 09/02/20 0345  AMMONIA 42*   Diabetic: No results for input(s): HGBA1C in the last 72 hours.  Recent Labs  Lab 09/02/20 2001 09/03/20 0003 09/03/20 0408 09/03/20 0756 09/03/20 1202  GLUCAP 144* 202* 158* 199* 164*   Cardiac Enzymes: No results for input(s): CKTOTAL, CKMB, CKMBINDEX, TROPONINI in the last 168 hours. No results for input(s): PROBNP in the last 8760 hours. Coagulation Profile: Recent Labs  Lab 09/02/20 0345  INR 1.4*   Thyroid Function Tests: No results for input(s): TSH, T4TOTAL, FREET4, T3FREE, THYROIDAB in the last 72 hours. Lipid  Profile: No results for input(s): CHOL, HDL, LDLCALC, TRIG, CHOLHDL, LDLDIRECT in the last 72 hours. Anemia Panel: No results for input(s): VITAMINB12, FOLATE, FERRITIN, TIBC, IRON, RETICCTPCT in the last 72 hours. Urine analysis: No results found for: COLORURINE, APPEARANCEUR, LABSPEC, PHURINE, GLUCOSEU, HGBUR,  BILIRUBINUR, KETONESUR, PROTEINUR, UROBILINOGEN, NITRITE, LEUKOCYTESUR Sepsis Labs: Invalid input(s): PROCALCITONIN, [place]  Microbiology: Recent Results (from the past 240 hour(s))  Culture, blood (routine x 2)     Status: None   Collection Time: 08/26/20  2:11 PM   Specimen: BLOOD  Result Value Ref Range Status   Specimen Description   Final    BLOOD BLOOD RIGHT HAND Performed at [facility] [address] [place], [place] [address]    Special Requests   Final    BOTTLES DRAWN AEROBIC ONLY Blood Culture adequate volume Performed at [facility] [address] [place], [place] [address]    Culture   Final    NO GROWTH 5 DAYS Performed at [facility], [place] [address], [place], [place] [address]    Report Status 08/31/2020 FINAL  Final    Radiology Studies: No results found.    Khristen Cheyney T. [place]  If 7PM-7AM, please contact night-coverage [email or web address] 09/03/2020, 1:40 PM

## 2020-09-03 NOTE — Progress Notes (Signed)
[facility] for Infectious Disease  Date of Admission:  08/14/2020           Reason for visit: Follow up on biliary sepsis  Current antibiotics: Unasyn  ASSESSMENT:    Biliary sepsis/cholangitis status post internal/external drain placement 7/28.  He completed a course of Unasyn and Eraxis on 8/4.  Subsequently had a capping trial initiated on 8/5 with uptrending of his leukocytosis and low-grade fevers.  Repeat blood cultures have been sent and are no growth.  His drain is now attached to gravity bag and he seems to be clinically improving. Low-grade fevers, leukocytosis, tachycardia Obstructive jaundice secondary to pancreatic adenocarcinoma Loose stool  PLAN:    Continue Unasyn for now Monitor closely for fever, WBC, CMP Continue internal/external drain to gravity bag.  Appreciate IR assistance Monitor stool output for any changes.  May need to consider C. difficile testing if there is worsening Will follow   Principal Problem:   Biliary obstruction Active Problems:   Pancreatic adenocarcinoma (HCC)   Diabetes mellitus type 2 in nonobese [facility])   Essential hypertension   Non-intractable vomiting   Malnutrition of moderate degree   Cholangitis    MEDICATIONS:    Scheduled Meds:  amLODipine  10 mg Oral Daily   carvedilol  6.25 mg Oral BID   cholestyramine light  4 g Oral BID   feeding supplement  1 Container Oral TID BM   feeding supplement (PROSource TF)  45 mL Per Tube TID   ferrous sulfate  325 mg Oral Q breakfast   free water  200 mL Per Tube Q4H   insulin aspart  0-15 Units Subcutaneous TID WC   insulin aspart  0-5 Units Subcutaneous QHS   insulin aspart  4 Units Subcutaneous Q4H   insulin detemir  30 Units Subcutaneous BID   irbesartan  300 mg Oral Daily   pantoprazole  40 mg Intravenous Q1400   potassium chloride  40 mEq Per Tube Q4H   sodium chloride flush  10-40 mL Intracatheter Q12H   sodium chloride flush  5 mL Intracatheter Q8H   sodium  chloride flush  5 mL Intracatheter Q8H   Continuous Infusions:  ampicillin-sulbactam (UNASYN) IV 3 g (09/03/20 0609)   feeding supplement (OSMOLITE 1.5 CAL) 55 mL/hr at 09/02/20 2200   PRN Meds:.doxylamine (Sleep), labetalol, loperamide HCl, morphine injection, ondansetron (ZOFRAN) IV, oxyCODONE, sodium chloride flush  SUBJECTIVE:   24 hour events:   T-max 24 hours 100.3 Seen by IR yesterday.  Capping trial was discontinued with drain attached to gravity bag. Resumed on Unasyn 8/7 after completing therapy on 8/4. Repeat blood cultures 8/6 currently no growth WBC today slightly improved down to 14 LFTs stable/improved Creatinine 0.6  Patient without acute complaints this morning.  Denies any fevers or chills.  No significant pain.  Tolerating resumption of antibiotics at this time.  Review of Systems  All other systems reviewed and are negative.    OBJECTIVE:   Blood pressure (!) 133/55, pulse (!) 101, temperature 98.4 F (36.9 C), temperature source Oral, resp. rate 20, height '5\' 10"'$  (1.778 m), weight 74.3 kg, SpO2 100 %. Body mass index is 23.5 kg/m.  Physical Exam Constitutional:      General: He is not in acute distress.    Appearance: Normal appearance.  HENT:     Head: Normocephalic and atraumatic.  Eyes:     General: Scleral icterus present.  Pulmonary:     Effort: Pulmonary effort is normal. No  respiratory distress.  Abdominal:     Comments: Int/Ext biliary drain in place with bilious fluid in bag.   Musculoskeletal:        General: Normal range of motion.     Cervical back: Normal range of motion and neck supple.  Neurological:     General: No focal deficit present.     Mental Status: He is alert and oriented to person, place, and time.  Psychiatric:        Mood and Affect: Mood normal.        Behavior: Behavior normal.     Lab Results: Lab Results  Component Value Date   WBC 14.2 (H) 09/03/2020   HGB 9.2 (L) 09/03/2020   HCT 27.0 (L) 09/03/2020    MCV 92.8 09/03/2020   PLT 305 09/03/2020    Lab Results  Component Value Date   NA 137 09/03/2020   K 3.3 (L) 09/03/2020   CO2 19 (L) 09/03/2020   GLUCOSE 193 (H) 09/03/2020   BUN 18 09/03/2020   CREATININE 0.64 09/03/2020   CALCIUM 8.6 (L) 09/03/2020   GFRNONAA >60 09/03/2020    Lab Results  Component Value Date   ALT 99 (H) 09/03/2020   AST 118 (H) 09/03/2020   ALKPHOS 169 (H) 09/03/2020   BILITOT 10.6 (H) 09/03/2020    No results found for: CRP  No results found for: ESRSEDRATE   I have reviewed the micro and lab results in Epic.  Imaging: CT ABDOMEN PELVIS W CONTRAST  Result Date: 09/02/2020 CLINICAL DATA:  Abdominal abscess/infection suspected. Right upper quadrant pain EXAM: CT ABDOMEN AND PELVIS WITH CONTRAST TECHNIQUE: Multidetector CT imaging of the abdomen and pelvis was performed using the standard protocol following bolus administration of intravenous contrast. CONTRAST:  25m OMNIPAQUE IOHEXOL 350 MG/ML SOLN COMPARISON:  07/27/2020 FINDINGS: Lower chest: Small bilateral pleural effusions. Bibasilar dependent atelectasis. Heart is normal size. Hepatobiliary: Percutaneous transpedicle biliary stent is in place terminating in the duodenum. There is pneumobilia. Gas also noted within the gallbladder. No visible suspicious focal hepatic abnormality. Pancreas: Again seen is the infiltrating mass in the region of the pancreatic head, not significantly changed since prior study. Pancreatic ductal dilatation, stable. Spleen: No focal abnormality.  Normal size. Adrenals/Urinary Tract: No adrenal abnormality. No focal renal abnormality. No stones or hydronephrosis. Urinary bladder is unremarkable. Stomach/Bowel: There is sigmoid diverticulosis. No active diverticulitis. Wall thickening noted in the cecum and ascending colon concerning for colitis. No evidence of bowel obstruction. Gastrostomy tube within the stomach. Vascular/Lymphatic: Aortic atherosclerosis. No aneurysm.  Prominent upper abdominal lymph nodes. Portacaval lymph node with a short axis diameter of 12 mm again seen, not significantly changed. Mildly prominent central mesenteric lymph nodes are also stable. Reproductive: Prostate enlargement. Other: Moderate free fluid in the pelvis. Musculoskeletal: No acute bony abnormality. IMPRESSION: Stable pancreatic head mass. Interval percutaneous transhepatic biliary drain placement. Decreasing biliary distension. Pneumobilia now present. Wall thickening noted within the right colon concerning for colitis. Sigmoid diverticulosis.  No active diverticulitis. Gastrostomy tube in the stomach. Small bilateral effusions, bibasilar atelectasis. Aortic atherosclerosis. Electronically Signed   By: KRolm BaptiseM.D.   On: 09/02/2020 01:17   DG ABD ACUTE 2+V W 1V CHEST  Result Date: 09/01/2020 CLINICAL DATA:  Fever EXAM: DG ABDOMEN ACUTE WITH 1 VIEW CHEST COMPARISON:  08/22/2020 FINDINGS: Percutaneous biliary stent noted in the right abdomen. Nonobstructive bowel gas pattern. No free air or suspicious calcification. Visualized lung bases clear. IMPRESSION: No acute findings. Electronically Signed   By:  Rolm Baptise M.D.   On: 09/01/2020 21:59     Imaging independently reviewed in Epic.    Raynelle [facility] for [facility]ious [facility] [phone] pager 09/03/2020, 12:04 PM  I spent greater than 35 minutes with the patient including greater than 50% of time in face to face counsel of the patient and in coordination of their care.

## 2020-09-04 DIAGNOSIS — C259 Malignant neoplasm of pancreas, unspecified: Secondary | ICD-10-CM | POA: Diagnosis not present

## 2020-09-04 DIAGNOSIS — E440 Moderate protein-calorie malnutrition: Secondary | ICD-10-CM | POA: Diagnosis not present

## 2020-09-04 DIAGNOSIS — Z7189 Other specified counseling: Secondary | ICD-10-CM

## 2020-09-04 DIAGNOSIS — R112 Nausea with vomiting, unspecified: Secondary | ICD-10-CM | POA: Diagnosis not present

## 2020-09-04 DIAGNOSIS — Z515 Encounter for palliative care: Secondary | ICD-10-CM

## 2020-09-04 DIAGNOSIS — K831 Obstruction of bile duct: Secondary | ICD-10-CM | POA: Diagnosis not present

## 2020-09-04 LAB — GLUCOSE, CAPILLARY
Glucose-Capillary: 115 mg/dL — ABNORMAL HIGH (ref 70–99)
Glucose-Capillary: 134 mg/dL — ABNORMAL HIGH (ref 70–99)
Glucose-Capillary: 154 mg/dL — ABNORMAL HIGH (ref 70–99)
Glucose-Capillary: 272 mg/dL — ABNORMAL HIGH (ref 70–99)
Glucose-Capillary: 91 mg/dL (ref 70–99)
Glucose-Capillary: 98 mg/dL (ref 70–99)

## 2020-09-04 MED ORDER — INSULIN DETEMIR 100 UNIT/ML ~~LOC~~ SOLN
25.0000 [IU] | Freq: Two times a day (BID) | SUBCUTANEOUS | Status: DC
  Administered 2020-09-04 – 2020-09-05 (×3): 25 [IU] via SUBCUTANEOUS
  Filled 2020-09-04 (×4): qty 0.25

## 2020-09-04 MED ORDER — AMLODIPINE BESYLATE 5 MG PO TABS: 5.0000 mg | ORAL_TABLET | Freq: Every day | ORAL | Status: DC

## 2020-09-04 NOTE — Progress Notes (Signed)
= PROGRESS NOTE  Jeff Jordan [id number] DOB: 26-Apr-1955   PCP: Georganna Skeans, PA-C  Patient is from: Home.  Lives alone.  However, daughter lives close by.  DOA: 08/14/2020 LOS: 20  Chief complaints:  No chief complaint on file.    Brief Narrative / Interim history: 65 year old M with PMH of pancreatic adenocarcinoma, DM-2, HTN and FTT presenting for biliary obstruction due to pancreatic adenocarcinoma after he failed biliary stent placement by ERCP at GI office on 7/19 due to duodenal stenosis.  He had percutaneous drain placed on 7/21 that was converted to internal and external biliary drain on 7/22.  He underwent cholangiogram on 7/28.  Biliary fluid with Candida glabrata and staph epidermis.  Started on Diflucan and ceftriaxone on 7/29 which were since changed to Eraxis and IV Unasyn, and he completed 5 days course on 8/4.  LFT and bilirubin improving.  G-tube placed by IR on 8/1 after unsuccessful attempt for jejunostomy tube due to inability to cannulate the pylorus.  Tube feeds restarted and titrated to goal rate.  He is also on clear liquid diet.  IR started drain capping but patient spiked fever with rising leukocytosis and tachycardia.  Repeat CT abdomen and pelvis with stable pancreatic head mass, decreased biliary distention, new pneumobilia and possible right colitis.  Blood cultures obtained.  ID resumed IV Unasyn on 8/7.  Biliary drain uncapped and to gravity.  ID recommends IV Unasyn through 09/06/2020, monitoring fever curve and WBC.  IR following.   Subjective: Seen and examined earlier this morning.  No major events overnight of this morning.  No complaints.  He denies fever, chest pain, dyspnea, nausea, vomiting, abdominal pain, diarrhea or UTI symptoms.  Objective: Vitals:   09/03/20 1041 09/03/20 1419 09/03/20 2116 09/04/20 0440  BP: (!) 133/55 (!) 121/52 (!) 111/49 (!) 113/51  Pulse: (!) 101 (!) 105 (!) 110 99  Resp: '20 19 20 18  '$ Temp: 98.4 F (36.9 C) 98.3 F  (36.8 C) 99 F (37.2 C) 98.2 F (36.8 C)  TempSrc: Oral Oral Oral Oral  SpO2: 100% 100% 99% 98%  Weight:      Height:        Intake/Output Summary (Last 24 hours) at 09/04/2020 1521 Last data filed at 09/04/2020 1400 Gross per 24 hour  Intake 13640 ml  Output 1350 ml  Net 12290 ml   Filed Weights   09/01/20 0500 09/02/20 0500 09/03/20 0500  Weight: 67.6 kg 67.7 kg 74.3 kg    Examination:  GENERAL: Frail and chronically ill-appearing.  Nontoxic. HEENT: MMM.  Vision and hearing grossly intact.  NECK: Supple.  No apparent JVD.  RESP: On RA. No IWOB.  Fair aeration bilaterally. CVS: Slightly tachycardic to 100.  Heart sounds normal.  ABD/GI/GU: BS+. Abd soft.  G-tube.  Biliary drain. MSK/EXT:  Moves extremities. No apparent deformity. No edema.  SKIN: Skin jaundice. NEURO: Awake and alert. Oriented appropriately.  No apparent focal neuro deficit. PSYCH: Calm. Normal affect.   Procedures:  -7/19-outpatient biliary stent placement unsuccessful -7/21-percutaneous drain placement -7/22-conversion of external biliary drain to internal and external biliary drain  -7/28-IR cholangiogram on 7/28 -8/01-G-tube placed by IR   Microbiology summarized: 7/19-COVID-19 PCR nonreactive. 7/27-biliary culture with staph epidermis and Candida glabrata 8/06-blood culture pending.  Assessment & Plan: Biliary obstruction due to pancreatic adenocarcinoma Elevated liver enzymes/obstructive jaundice-improving. Biliary infection/cholangitis in the setting of the above Recent Labs  Lab 08/30/20 1036 08/31/20 0504 09/01/20 0450 09/02/20 0345 09/03/20 0056  AST 139* 128*  123* 130* 118*  ALT 127* 122* 116* 115* 99*  ALKPHOS 156* 151* 154* 155* 169*  BILITOT 9.8* 9.4* 8.9* 11.0* 10.6*  PROT 6.3* 6.0* 5.9* 6.3* 5.9*  ALBUMIN 2.2* 2.3* 2.1* 2.4* 1.9*  -7/19-outpatient ERCP and biliary stent placement unsuccessful due to duodenal stenosis. -7/21-[facility] biliary drain,  -7/22-conversion to  internal and external biliary drain  -7/28-IR cholangiogram  -7/27-biliary fluid culture with Candida glabrata and staph epidermis.  Staph epidermis likely contaminant.   -8/4-completed 5 days of Eraxis and IV Unasyn>> Augmentin -8/6-febrile, tachycardic with rising leukocytosis on 8/6 after capping biliary drain.  Blood cultures NGTD. -8/7-CT abdomen and pelvis concerning for new pneumobilia and right-sided colitis. -8/7-IR following-uncapped biliary drain.  Now drain to gravity. -Appreciate help by ID:  -Continue IV Unasyn through 09/06/2020  -Follow CBC and fever curve -Has referral to oncology office in [place] -Pain controlled with p.o. medications -Hold home Crestor.  Diarrhea/abdominal pain: Likely due to tube feed. CT raises concern for right colitis but leukocytosis and diarrhea has subsided.  Abdominal exam benign. -Imodium and Lomotil per tube as needed.  Gastric outlet obstruction -G-tube by IR on 8/1 after unsuccessful attempt for jejunostomy tube due to inability to cannulate the pylorus.   -Tolerating clear liquid diet as well. Not sure if we can advance beyond this with his pyloric stenosis.  Chest pain: Likely due to underlying malignancy.  EKG, troponin, BNP and x-ray reassuring.  Resolved.   Hypernatremia likely hypovolemic. Na 150>> > 137. -Continue FW arts 200 cc every 4 hours -Continue Avapro-increase to 300 mg for BP -Encourage p.o. water -Recheck in the morning  Controlled NIDDM-2 with hyperglycemia and hyperglycemia: On Jardiance, metformin and Actos at home.  A1c 5.3%. Recent Labs  Lab 09/03/20 2043 09/04/20 0004 09/04/20 0425 09/04/20 0731 09/04/20 1207  GLUCAP 98 154* 115* 91 134*  -Decrease Levemir to 25 units twice daily-hold if CBG less than 100 -Continue SSI-moderate -Decrease NovoLog to 4 units every 4 hours-hold if CBG less than 100 -Continue holding home Crestor with elevated LFT -May need insulin on discharge for tube feed coverage.  I do  not think his p.o. meds are ideal in his situation.  Normocytic anemia/anemia of chronic disease: H&H relatively stable. Recent Labs    08/25/20 0718 08/26/20 0720 08/27/20 1015 08/28/20 0501 08/29/20 0947 08/30/20 1036 08/31/20 0504 09/01/20 0450 09/02/20 0508 09/03/20 0056  HGB 10.1* 10.2* 10.3* 10.8* 11.1* 9.7* 9.7* 9.3* 9.2* 9.2*  -Continue monitoring  Essential hypertension: Slightly soft blood pressure today. -Decrease amlodipine to 5 mg daily starting tomorrow. -Continue Avapro to 300 mg daily -Add Coreg 6.25 mg twice daily. -Continue hydralazine IV as needed  Hypokalemia/hypophosphatemia: No morning lab today. -Recheck in the morning  Metabolic acidosis: Likely due to diarrhea.  Improving -Manage diarrhea as above.   HLD: Continue hold Crestor in the setting of elevated LFT.   GERD: Protonix 40 mg IV daily   Thrush: Nystatin swish and swallow.  He is also on Eraxis now.  Generalized weakness/debility -PT/OT eval and therapy  Leukocytosis/bandemia: WBC rising. -Continue monitoring  Goal of care counseling: Significant comorbidity as above.  Prognosis does not look great. -Palliative care consult  Adult failure to thrive/moderate malnutrition-poor p.o. intake in the setting of adenocarcinoma. Body mass index is 23.5 kg/m. Nutrition Problem: Moderate Malnutrition Etiology: chronic illness, cancer and cancer related treatments Signs/Symptoms: mild fat depletion, mild muscle depletion, moderate muscle depletion Interventions: Tube feeding   DVT prophylaxis:  SCDs Start: 08/14/20 1624  Code Status:  Full code Family Communication: Patient and/or RN.  None at bedside today Level of care: Telemetry Status is: Inpatient  Remains inpatient appropriate because:Persistent severe electrolyte disturbances, IV treatments appropriate due to intensity of illness or inability to take PO, and Inpatient level of care appropriate due to severity of illness  Dispo: The  patient is from: Home              Anticipated d/c is to: Home              Patient currently is not medically stable to d/c.   Difficult to place patient No       Consultants:  GI IR ID   Sch Meds:  Scheduled Meds:  amLODipine  10 mg Oral Daily   carvedilol  6.25 mg Oral BID   cholestyramine light  4 g Oral BID   diphenoxylate-atropine  1 tablet Oral QID   feeding supplement  1 Container Oral TID BM   feeding supplement (PROSource TF)  45 mL Per Tube TID   ferrous sulfate  325 mg Oral Q breakfast   free water  200 mL Per Tube Q4H   insulin aspart  0-15 Units Subcutaneous TID WC   insulin aspart  0-5 Units Subcutaneous QHS   insulin aspart  4 Units Subcutaneous Q4H   insulin detemir  25 Units Subcutaneous BID   irbesartan  300 mg Oral Daily   pantoprazole  40 mg Intravenous Q1400   sodium chloride flush  10-40 mL Intracatheter Q12H   sodium chloride flush  5 mL Intracatheter Q8H   sodium chloride flush  5 mL Intracatheter Q8H   Continuous Infusions:  ampicillin-sulbactam (UNASYN) IV 3 g (09/04/20 1154)   feeding supplement (OSMOLITE 1.5 CAL) 55 mL/hr at 09/02/20 2200   PRN Meds:.doxylamine (Sleep), labetalol, loperamide HCl, morphine injection, ondansetron (ZOFRAN) IV, oxyCODONE, sodium chloride flush  Antimicrobials: Anti-infectives (From admission, onward)    Start     Dose/Rate Route Frequency Ordered Stop   09/02/20 1600  Ampicillin-Sulbactam (UNASYN) 3 g in sodium chloride 0.9 % 100 mL IVPB        3 g 200 mL/hr over 30 Minutes Intravenous Every 6 hours 09/02/20 1520 09/06/20 2359   08/28/20 1300  amoxicillin-clavulanate (AUGMENTIN) 400-57 MG/5ML suspension 875 mg        875 mg Per Tube Every 12 hours 08/28/20 1152 08/30/20 2143   08/27/20 1500  vancomycin (VANCOCIN) IVPB 1000 mg/200 mL premix        1,000 mg 200 mL/hr over 60 Minutes Intravenous To Radiology 08/24/20 1644 08/27/20 1644   08/27/20 1000  anidulafungin (ERAXIS) 100 mg in sodium chloride 0.9 %  100 mL IVPB       See Hyperspace for full Linked Orders Report.   100 mg 78 mL/hr over 100 Minutes Intravenous Daily 08/26/20 1115 08/30/20 2359   08/26/20 1230  anidulafungin (ERAXIS) 200 mg in sodium chloride 0.9 % 200 mL IVPB       See Hyperspace for full Linked Orders Report.   200 mg 78 mL/hr over 200 Minutes Intravenous Once 08/26/20 1115 08/27/20 0008   08/26/20 1230  Ampicillin-Sulbactam (UNASYN) 3 g in sodium chloride 0.9 % 100 mL IVPB  Status:  Discontinued        3 g 200 mL/hr over 30 Minutes Intravenous Every 6 hours 08/26/20 1147 08/28/20 1152   08/24/20 1800  cefTRIAXone (ROCEPHIN) 2 g in sodium chloride 0.9 % 100 mL IVPB  Status:  Discontinued  2 g 200 mL/hr over 30 Minutes Intravenous Every 24 hours 08/24/20 1640 08/26/20 1147   08/24/20 1800  fluconazole (DIFLUCAN) IVPB 100 mg  Status:  Discontinued        100 mg 50 mL/hr over 60 Minutes Intravenous Every 24 hours 08/24/20 1640 08/26/20 1115   08/21/20 1100  metroNIDAZOLE (FLAGYL) tablet 500 mg  Status:  Discontinued        500 mg Oral Every 8 hours 08/21/20 1002 08/22/20 0744   08/19/20 1800  metroNIDAZOLE (FLAGYL) IVPB 500 mg  Status:  Discontinued        500 mg 100 mL/hr over 60 Minutes Intravenous Every 8 hours 08/19/20 1614 08/21/20 1002   08/17/20 0800  ciprofloxacin (CIPRO) tablet 500 mg        500 mg Oral 2 times daily 08/17/20 0411 08/21/20 2152   08/16/20 0600  cefOXitin (MEFOXIN) 2 g in sodium chloride 0.9 % 100 mL IVPB        2 g 200 mL/hr over 30 Minutes Intravenous To Radiology 08/14/20 1620 08/16/20 1704        I have personally reviewed the following labs and images: CBC: Recent Labs  Lab 08/30/20 1036 08/31/20 0504 09/01/20 0450 09/02/20 0508 09/03/20 0056  WBC 12.8* 13.7* 14.2* 16.1* 14.2*  NEUTROABS  --   --   --  12.6*  --   HGB 9.7* 9.7* 9.3* 9.2* 9.2*  HCT 29.5* 29.3* 27.5* 26.5* 27.0*  MCV 95.2 94.5 93.9 92.3 92.8  PLT 321 311 314 249 305   BMP &GFR Recent Labs  Lab  08/30/20 1036 08/31/20 0504 09/01/20 0450 09/02/20 0345 09/03/20 0056  NA 150* 147* 140 138 137  K 3.7 3.8 3.7 3.6 3.3*  CL 121* 120* 113* 111 109  CO2 22 20* 20* 20* 19*  GLUCOSE 126* 207* 178* 77 193*  BUN '14 13 16 17 18  '$ CREATININE 0.66 0.60* 0.65 0.51* 0.64  CALCIUM 9.2 8.9 8.7* 8.8* 8.6*  MG 2.2 2.2 2.1 2.3 2.1  PHOS 2.5 2.7 2.8 3.1 2.3*   Estimated Creatinine Clearance: 93.8 mL/min (by C-G formula based on SCr of 0.64 mg/dL). Liver & Pancreas: Recent Labs  Lab 08/30/20 1036 08/31/20 0504 09/01/20 0450 09/02/20 0345 09/03/20 0056  AST 139* 128* 123* 130* 118*  ALT 127* 122* 116* 115* 99*  ALKPHOS 156* 151* 154* 155* 169*  BILITOT 9.8* 9.4* 8.9* 11.0* 10.6*  PROT 6.3* 6.0* 5.9* 6.3* 5.9*  ALBUMIN 2.2* 2.3* 2.1* 2.4* 1.9*   Recent Labs  Lab 09/03/20 0056  LIPASE 55*   Recent Labs  Lab 09/02/20 0345  AMMONIA 42*   Diabetic: No results for input(s): HGBA1C in the last 72 hours.  Recent Labs  Lab 09/03/20 2043 09/04/20 0004 09/04/20 0425 09/04/20 0731 09/04/20 1207  GLUCAP 98 154* 115* 91 134*   Cardiac Enzymes: No results for input(s): CKTOTAL, CKMB, CKMBINDEX, TROPONINI in the last 168 hours. No results for input(s): PROBNP in the last 8760 hours. Coagulation Profile: Recent Labs  Lab 09/02/20 0345  INR 1.4*   Thyroid Function Tests: No results for input(s): TSH, T4TOTAL, FREET4, T3FREE, THYROIDAB in the last 72 hours. Lipid Profile: No results for input(s): CHOL, HDL, LDLCALC, TRIG, CHOLHDL, LDLDIRECT in the last 72 hours. Anemia Panel: No results for input(s): VITAMINB12, FOLATE, FERRITIN, TIBC, IRON, RETICCTPCT in the last 72 hours. Urine analysis: No results found for: COLORURINE, APPEARANCEUR, LABSPEC, PHURINE, GLUCOSEU, HGBUR, BILIRUBINUR, KETONESUR, PROTEINUR, UROBILINOGEN, NITRITE, LEUKOCYTESUR Sepsis Labs: Invalid input(s): PROCALCITONIN, LACTICIDVEN  Microbiology: Recent Results (from the past 240 hour(s))  Culture, blood  (routine x 2)     Status: None   Collection Time: 08/26/20  2:11 PM   Specimen: BLOOD  Result Value Ref Range Status   Specimen Description   Final    BLOOD BLOOD RIGHT HAND Performed at [facility] [address] [place], [place] [address]    Special Requests   Final    BOTTLES DRAWN AEROBIC ONLY Blood Culture adequate volume Performed at [facility] [address] [place], [place] [address]    Culture   Final    NO GROWTH 5 DAYS Performed at [facility], [place] [address], [place], [place] [address]    Report Status 08/31/2020 FINAL  Final  Culture, blood (routine x 2)     Status: None (Preliminary result)   Collection Time: 09/01/20 10:47 PM   Specimen: BLOOD  Result Value Ref Range Status   Specimen Description   Final    BLOOD BLOOD LEFT HAND Performed at [facility] [address] [place], [place] [address]    Special Requests   Final    BOTTLES DRAWN AEROBIC AND ANAEROBIC Blood Culture results may not be optimal due to an inadequate volume of blood received in culture bottles Performed at [facility] [address], [place], [place] [address]    Culture   Final    NO GROWTH 2 DAYS Performed at [facility] [address] [place], [place] [address]    Report Status PENDING  Incomplete  Culture, blood (routine x 2)     Status: None (Preliminary result)   Collection Time: 09/01/20 10:47 PM   Specimen: BLOOD  Result Value Ref Range Status   Specimen Description   Final    BLOOD BLOOD LEFT HAND Performed at [facility] [address] [place], [place] [address]    Special Requests   Final    BOTTLES DRAWN AEROBIC ONLY Blood Culture results may not be optimal due to an inadequate volume of blood received in culture bottles Performed at [facility] [address] [place], [place] [address]    Culture   Final    NO GROWTH 2  DAYS Performed at [facility] [address], [place], [place] [address]    Report Status PENDING  Incomplete    Radiology Studies: No results found.    Raysa Bosak T. [place]  If 7PM-7AM, please contact night-coverage [email or web address] 09/04/2020, 3:21 PM

## 2020-09-04 NOTE — Progress Notes (Signed)
[facility] for Infectious Disease  Date of Admission:  08/14/2020           Reason for visit: Follow up on biliary sepsis  Current antibiotics: Unasyn  ASSESSMENT:    Biliary sepsis/cholangitis: Status post internal/external drain placement 7/28.  He completed a course of Unasyn and Eraxis on 8/4 based on drain cultures.  Subsequently had a capping trial initiated on 8/5 with uptrending of his leukocytosis and low-grade fevers.  Repeat blood cultures have been no growth.  His drain is now attached to gravity bag and he seems to be clinically improving.  He has been afebrile over the last 24 hours with stable WBC. Obstructive jaundice secondary to pancreatic adenocarcinoma  PLAN:    Continue Unasyn x5 days total.  End date 09/06/2020 Monitor fever curve, WBC Continue internal/external drain to gravity bag.  Appreciate IR assistance Will sign off for now, please call as needed   Principal Problem:   Biliary obstruction Active Problems:   Pancreatic adenocarcinoma (HCC)   Diabetes mellitus type 2 in nonobese [facility])   Essential hypertension   Non-intractable vomiting   Malnutrition of moderate degree   Cholangitis    MEDICATIONS:    Scheduled Meds:  amLODipine  10 mg Oral Daily   carvedilol  6.25 mg Oral BID   cholestyramine light  4 g Oral BID   diphenoxylate-atropine  1 tablet Oral QID   feeding supplement  1 Container Oral TID BM   feeding supplement (PROSource TF)  45 mL Per Tube TID   ferrous sulfate  325 mg Oral Q breakfast   free water  200 mL Per Tube Q4H   insulin aspart  0-15 Units Subcutaneous TID WC   insulin aspart  0-5 Units Subcutaneous QHS   insulin aspart  4 Units Subcutaneous Q4H   insulin detemir  25 Units Subcutaneous BID   irbesartan  300 mg Oral Daily   pantoprazole  40 mg Intravenous Q1400   sodium chloride flush  10-40 mL Intracatheter Q12H   sodium chloride flush  5 mL Intracatheter Q8H   sodium chloride flush  5 mL Intracatheter Q8H    Continuous Infusions:  ampicillin-sulbactam (UNASYN) IV 3 g (09/04/20 1154)   feeding supplement (OSMOLITE 1.5 CAL) 55 mL/hr at 09/02/20 2200   PRN Meds:.doxylamine (Sleep), labetalol, loperamide HCl, morphine injection, ondansetron (ZOFRAN) IV, oxyCODONE, sodium chloride flush  SUBJECTIVE:   24 hour events:  No acute events noted  Patient without any acute complaints.  Denies fevers or chills.  He reports stable abdominal pain.  Review of Systems  All other systems reviewed and are negative.    OBJECTIVE:   Blood pressure (!) 113/51, pulse 99, temperature 98.2 F (36.8 C), temperature source Oral, resp. rate 18, height '5\' 10"'$  (1.778 m), weight 74.3 kg, SpO2 98 %. Body mass index is 23.5 kg/m.  Physical Exam Constitutional:      General: He is not in acute distress.    Appearance: Normal appearance.  HENT:     Head: Normocephalic and atraumatic.  Eyes:     General: Scleral icterus present.  Abdominal:     General: There is no distension.     Palpations: Abdomen is soft.     Tenderness: There is no abdominal tenderness.     Comments: Biliary drain in place  Neurological:     General: No focal deficit present.     Mental Status: He is alert and oriented to person, place, and time.  Psychiatric:        Mood and Affect: Mood normal.        Behavior: Behavior normal.     Lab Results: Lab Results  Component Value Date   WBC 14.2 (H) 09/03/2020   HGB 9.2 (L) 09/03/2020   HCT 27.0 (L) 09/03/2020   MCV 92.8 09/03/2020   PLT 305 09/03/2020    Lab Results  Component Value Date   NA 137 09/03/2020   K 3.3 (L) 09/03/2020   CO2 19 (L) 09/03/2020   GLUCOSE 193 (H) 09/03/2020   BUN 18 09/03/2020   CREATININE 0.64 09/03/2020   CALCIUM 8.6 (L) 09/03/2020   GFRNONAA >60 09/03/2020    Lab Results  Component Value Date   ALT 99 (H) 09/03/2020   AST 118 (H) 09/03/2020   ALKPHOS 169 (H) 09/03/2020   BILITOT 10.6 (H) 09/03/2020    No results found for: CRP  No  results found for: ESRSEDRATE   I have reviewed the micro and lab results in Epic.  Imaging: No results found.   Imaging independently reviewed in Epic.    Raynelle High[facility] for Infectious Disease [facility] [phone] pager 09/04/2020, 2:50 PM

## 2020-09-04 NOTE — Progress Notes (Signed)
Mid line doesn't draw back

## 2020-09-04 NOTE — Progress Notes (Signed)
PT Cancellation Note  Patient Details Name: Jeff Jordan MRN: [id number] DOB: 14-Oct-1955   Cancelled Treatment:     pt just got back to bed from bathroom.  C/o freq loose stools but starting to slow down.  Will attempt to see another day as schedule permits.   Nathanial Rancher 09/04/2020, 3:14 PM

## 2020-09-04 NOTE — Progress Notes (Signed)
Occupational Therapy Treatment Patient Details Name: Jeff Jordan MRN: [id number] DOB: 09-11-1955 Today's Date: 09/04/2020    History of present illness 65 year old M with PMH of pancreatic adenocarcinoma, DM-2, HTN and FTT presenting for biliary obstruction due to pancreatic adenocarcinoma after he failed biliary stent placement by ERCP at GI office on 7/19.  He had percutaneous drain placed on 7/21 that was converted to internal and external biliary drain on 7/22.  He underwent cholangiogram on 7/28.  Biliary fluid with Candida glabrata and staph epidermis.  Started on Diflucan and ceftriaxone on 7/29 which were since changed to Eraxis and IV Unasyn per ID recommendation.  LFT and bilirubin improving.  G-tube placed by IR on 8/1.  Tube feeds restarted.   OT comments  Patient attempted to trial toileting transfer with RW with min guard for supine to sit on edge of bed. Patient required set up for RW and min guard to transfer off edge of bed with bed elevated. Patient reported pain in R side of abdomen with mobility and declined to participate further. Patient was transitioned back into bed with mod A with education on proper hand and foot placement. Nurse made aware of patients pain. Patient's discharge plan remains appropriate at this time. OT will continue to follow acutely.    Follow Up Recommendations  Home health OT    Equipment Recommendations  None recommended by OT    Recommendations for Other Services      Precautions / Restrictions Precautions Precautions: Fall Precaution Comments: bilary drain on R, G tube, abdominal binder Restrictions Weight Bearing Restrictions: No       Mobility Bed Mobility Overal bed mobility: Needs Assistance Bed Mobility: Supine to Sit;Sit to Supine     Supine to sit: Supervision;HOB elevated Sit to supine: Mod assist   General bed mobility comments: Supervision for safety/ drain management plus increased time OOB then mod Assist to support  B LE back onto bed due to ABD pain    Transfers Overall transfer level: Needs assistance Equipment used: Rolling walker (2 wheeled) Transfers: Sit to/from Stand Sit to Stand: Min guard              Balance                                           ADL either performed or assessed with clinical judgement   ADL                                         General ADL Comments: patient attempted to toilet with min guard for sit to stand off edge of bed with patient reporting increased pain in R side of chest with activity. patient declined to further participate transitioning beck into bed with mod A at this time. nurse made aware.     Vision Patient Visual Report: No change from baseline     Perception     Praxis      Cognition                                                Exercises     Shoulder Instructions  General Comments      Pertinent Vitals/ Pain       Pain Assessment: Faces Faces Pain Scale: Hurts whole lot Pain Location: abdomen "Right side" Pain Descriptors / Indicators: Aching;Discomfort;Grimacing;Guarding Pain Intervention(s): Limited activity within patient's tolerance;Monitored during session  Home Living                                          Prior Functioning/Environment              Frequency  Min 2X/week        Progress Toward Goals  OT Goals(current goals can now be found in the care plan section)  Progress towards OT goals: Progressing toward goals (goals were reviewed on this date.)  Acute Rehab OT Goals Patient Stated Goal: Regain IND OT Goal Formulation: With patient Time For Goal Achievement: 09/03/20 Potential to Achieve Goals: Good ADL Goals Pt Will Transfer to Toilet: with min assist;ambulating;regular height toilet Pt Will Perform Toileting - Clothing Manipulation and hygiene: with min assist;sit to/from stand  Plan Discharge plan  remains appropriate    Co-evaluation                 AM-PAC OT "6 Clicks" Daily Activity     Outcome Measure   Help from another person eating meals?: None Help from another person taking care of personal grooming?: A Little Help from another person toileting, which includes using toliet, bedpan, or urinal?: A Little Help from another person bathing (including washing, rinsing, drying)?: A Little Help from another person to put on and taking off regular upper body clothing?: A Little Help from another person to put on and taking off regular lower body clothing?: A Lot 6 Click Score: 18    End of Session Equipment Utilized During Treatment: Rolling walker  OT Visit Diagnosis: Muscle weakness (generalized) (M62.81);Unsteadiness on feet (R26.81)   Activity Tolerance Patient tolerated treatment well   Patient Left in bed;with call bell/phone within reach;with bed alarm set   Nurse Communication Other (comment) (patients pain)        Time: 1350-1407 OT Time Calculation (min): 17 min  Charges: OT General Charges $OT Visit: 1 Visit OT Treatments $Self Care/Home Management : 8-22 mins  Jackelyn Poling OTR/L, MS Acute Rehabilitation Department Office# [phone] Pager# [phone]    [place] 09/04/2020, 2:24 PM

## 2020-09-04 NOTE — Progress Notes (Signed)
Patient ID: Jeff Jordan, male   DOB: 04-01-1955, 65 y.o.   MRN: [id number]     Supervising Physician: Daryll Brod  Patient Status:  [facility] - In-pt  Chief Complaint:  Pancreatic cancer, HTN, DM II and FTT. Pt had biliary drain placed 08/16/2020 d/t cholangitis and sepsis. G-tube placed 08/27/20.   Subjective:  Pt lying in bed sleeping, easily aroused by voice and answers questions appropriately. Pt is calm, pleasant and cooperative. He is in NAD. Pt denies pain/discomfort at biliary site today.   Allergies: Patient has no known allergies.  Medications: Prior to Admission medications   Medication Sig Start Date End Date Taking? Authorizing Provider  amLODipine (NORVASC) 5 MG tablet Take 5 mg by mouth daily. 06/19/20  Yes [provider]  aspirin 81 MG EC tablet Take 81 mg by mouth daily.   Yes [provider]  ferrous sulfate 325 (65 FE) MG tablet Take 325 mg by mouth daily with breakfast.   Yes [provider]  JARDIANCE 25 MG TABS tablet Take 25 mg by mouth daily. 07/23/20  Yes [provider]  loperamide (IMODIUM) 2 MG capsule Take by mouth as needed for diarrhea or loose stools.   Yes [provider]  metFORMIN (GLUCOPHAGE) 1000 MG tablet Take 1,000 mg by mouth 2 (two) times daily. 07/16/20  Yes [provider]  olmesartan (BENICAR) 40 MG tablet Take 40 mg by mouth daily. 06/25/20  Yes [provider]  omeprazole (PRILOSEC) 40 MG capsule Take 40 mg by mouth every morning. 03/01/20  Yes [provider]  ondansetron (ZOFRAN) 4 MG tablet Take 1 tablet (4 mg total) by mouth every 4 (four) hours as needed for nausea. 08/02/20  Yes Dayton Scrape A, NP  Oxycodone HCl 10 MG TABS Take 1 tablet (10 mg total) by mouth every 4 (four) hours as needed. Patient taking differently: Take 10 mg by mouth every 4 (four) hours as needed (pain). 08/02/20  Yes [place] Scrape A, NP  pioglitazone (ACTOS) 45 MG tablet Take 45 mg by mouth  daily. 06/25/20  Yes [provider]  rosuvastatin (CRESTOR) 5 MG tablet Take 5 mg by mouth once a week. 06/25/20  Yes [provider]     Vital Signs: BP (!) 113/51 (BP Location: Left Arm)   Pulse 99   Temp 98.2 F (36.8 C) (Oral)   Resp 18   Ht '5\' 10"'$  (1.778 m)   Wt 163 lb 12.8 oz (74.3 kg)   SpO2 98%   BMI 23.50 kg/m   Physical Exam Eyes:     General: Scleral icterus present.  Pulmonary:     Effort: Pulmonary effort is normal.  Abdominal:     Palpations: Abdomen is soft.  Skin:    General: Skin is warm and dry.     Comments: Biliary drain dressing C/D/I with no erythema, drainage or other s/sx of infection. Drain flushes easily. There is 75cc bilious fluid in bag with 500cc documented in Epic.  G-tube dressing C/D/I with no leaking noted. Skin C/D/I around g-tube site.   Psychiatric:        Mood and Affect: Mood normal.        Behavior: Behavior normal.    Imaging: CT ABDOMEN PELVIS W CONTRAST  Result Date: 09/02/2020 CLINICAL DATA:  Abdominal abscess/infection suspected. Right upper quadrant pain EXAM: CT ABDOMEN AND PELVIS WITH CONTRAST TECHNIQUE: Multidetector CT imaging of the abdomen and pelvis was performed using the standard protocol following bolus administration of intravenous contrast.  CONTRAST:  49m OMNIPAQUE IOHEXOL 350 MG/ML SOLN COMPARISON:  07/27/2020 FINDINGS: Lower chest: Small bilateral pleural effusions. Bibasilar dependent atelectasis. Heart is normal size. Hepatobiliary: Percutaneous transpedicle biliary stent is in place terminating in the duodenum. There is pneumobilia. Gas also noted within the gallbladder. No visible suspicious focal hepatic abnormality. Pancreas: Again seen is the infiltrating mass in the region of the pancreatic head, not significantly changed since prior study. Pancreatic ductal dilatation, stable. Spleen: No focal abnormality.  Normal size. Adrenals/Urinary Tract: No adrenal abnormality. No focal renal abnormality.  No stones or hydronephrosis. Urinary bladder is unremarkable. Stomach/Bowel: There is sigmoid diverticulosis. No active diverticulitis. Wall thickening noted in the cecum and ascending colon concerning for colitis. No evidence of bowel obstruction. Gastrostomy tube within the stomach. Vascular/Lymphatic: Aortic atherosclerosis. No aneurysm. Prominent upper abdominal lymph nodes. Portacaval lymph node with a short axis diameter of 12 mm again seen, not significantly changed. Mildly prominent central mesenteric lymph nodes are also stable. Reproductive: Prostate enlargement. Other: Moderate free fluid in the pelvis. Musculoskeletal: No acute bony abnormality. IMPRESSION: Stable pancreatic head mass. Interval percutaneous transhepatic biliary drain placement. Decreasing biliary distension. Pneumobilia now present. Wall thickening noted within the right colon concerning for colitis. Sigmoid diverticulosis.  No active diverticulitis. Gastrostomy tube in the stomach. Small bilateral effusions, bibasilar atelectasis. Aortic atherosclerosis. Electronically Signed   By: KRolm BaptiseM.D.   On: 09/02/2020 01:17   DG ABD ACUTE 2+V W 1V CHEST  Result Date: 09/01/2020 CLINICAL DATA:  Fever EXAM: DG ABDOMEN ACUTE WITH 1 VIEW CHEST COMPARISON:  08/22/2020 FINDINGS: Percutaneous biliary stent noted in the right abdomen. Nonobstructive bowel gas pattern. No free air or suspicious calcification. Visualized lung bases clear. IMPRESSION: No acute findings. Electronically Signed   By: KRolm BaptiseM.D.   On: 09/01/2020 21:59    Labs:  CBC: Recent Labs    08/31/20 0504 09/01/20 0450 09/02/20 0508 09/03/20 0056  WBC 13.7* 14.2* 16.1* 14.2*  HGB 9.7* 9.3* 9.2* 9.2*  HCT 29.3* 27.5* 26.5* 27.0*  PLT 311 314 249 305    COAGS: Recent Labs    08/15/20 0452 08/16/20 0553 08/27/20 1015 09/02/20 0345  INR 1.5* 1.1 1.2 1.4*    BMP: Recent Labs    08/31/20 0504 09/01/20 0450 09/02/20 0345 09/03/20 0056  NA 147*  140 138 137  K 3.8 3.7 3.6 3.3*  CL 120* 113* 111 109  CO2 20* 20* 20* 19*  GLUCOSE 207* 178* 77 193*  BUN '13 16 17 18  '$ CALCIUM 8.9 8.7* 8.8* 8.6*  CREATININE 0.60* 0.65 0.51* 0.64  GFRNONAA >60 >60 >60 >60    LIVER FUNCTION TESTS: Recent Labs    08/31/20 0504 09/01/20 0450 09/02/20 0345 09/03/20 0056  BILITOT 9.4* 8.9* 11.0* 10.6*  AST 128* 123* 130* 118*  ALT 122* 116* 115* 99*  ALKPHOS 151* 154* 155* 169*  PROT 6.0* 5.9* 6.3* 5.9*  ALBUMIN 2.3* 2.1* 2.4* 1.9*    Assessment and Plan: Pancreatic cancer, HTN, DM II and FTT. Pt had biliary drain placed 08/16/2020 d/t cholangitis and sepsis. G-tube placed 08/27/20. Biliary drain flushes easily with no resistance. 75cc bilious fluid in bag w/ 500cc recorded in Epic. No new lab values today. Pt is afebrile. Orders to continue flushing biliary drain TID, recoding OP q shift and change dressing as needed. IR to follow.   Electronically Signed: STyson Alias NP 09/04/2020, 10:14 AM   I spent a total of 20 minutes at the the patient's bedside AND on the  patient's hospital floor or unit, greater than 50% of which was counseling/coordinating care for biliary drain and g-tube assessment.

## 2020-09-05 DIAGNOSIS — K831 Obstruction of bile duct: Secondary | ICD-10-CM | POA: Diagnosis not present

## 2020-09-05 DIAGNOSIS — K8309 Other cholangitis: Secondary | ICD-10-CM | POA: Diagnosis not present

## 2020-09-05 DIAGNOSIS — I10 Essential (primary) hypertension: Secondary | ICD-10-CM | POA: Diagnosis not present

## 2020-09-05 DIAGNOSIS — E119 Type 2 diabetes mellitus without complications: Secondary | ICD-10-CM | POA: Diagnosis not present

## 2020-09-05 LAB — GLUCOSE, CAPILLARY
Glucose-Capillary: 122 mg/dL — ABNORMAL HIGH (ref 70–99)
Glucose-Capillary: 177 mg/dL — ABNORMAL HIGH (ref 70–99)
Glucose-Capillary: 181 mg/dL — ABNORMAL HIGH (ref 70–99)
Glucose-Capillary: 185 mg/dL — ABNORMAL HIGH (ref 70–99)
Glucose-Capillary: 188 mg/dL — ABNORMAL HIGH (ref 70–99)
Glucose-Capillary: 92 mg/dL (ref 70–99)
Glucose-Capillary: 95 mg/dL (ref 70–99)
Glucose-Capillary: 97 mg/dL (ref 70–99)

## 2020-09-05 LAB — COMPREHENSIVE METABOLIC PANEL
ALT: 100 U/L — ABNORMAL HIGH (ref 0–44)
AST: 125 U/L — ABNORMAL HIGH (ref 15–41)
Albumin: 2.2 g/dL — ABNORMAL LOW (ref 3.5–5.0)
Alkaline Phosphatase: 168 U/L — ABNORMAL HIGH (ref 38–126)
Anion gap: 11 (ref 5–15)
BUN: 24 mg/dL — ABNORMAL HIGH (ref 8–23)
CO2: 17 mmol/L — ABNORMAL LOW (ref 22–32)
Calcium: 8.7 mg/dL — ABNORMAL LOW (ref 8.9–10.3)
Chloride: 116 mmol/L — ABNORMAL HIGH (ref 98–111)
Creatinine, Ser: 0.71 mg/dL (ref 0.61–1.24)
GFR, Estimated: >60 mL/min (ref >60)
Glucose, Bld: 210 mg/dL — ABNORMAL HIGH (ref 70–99)
Potassium: 4.5 mmol/L (ref 3.5–5.1)
Sodium: 144 mmol/L (ref 135–145)
Total Bilirubin: 7.7 mg/dL — ABNORMAL HIGH (ref 0.3–1.2)
Total Protein: 6 g/dL — ABNORMAL LOW (ref 6.5–8.1)

## 2020-09-05 LAB — PHOSPHORUS: Phosphorus: 3.6 mg/dL (ref 2.5–4.6)

## 2020-09-05 LAB — CBC WITH DIFFERENTIAL/PLATELET
Abs Immature Granulocytes: 0.21 10*3/uL — ABNORMAL HIGH (ref 0.00–0.07)
Basophils Absolute: 0.1 10*3/uL (ref 0.0–0.1)
Basophils Relative: 1 %
Eosinophils Absolute: 0.3 10*3/uL (ref 0.0–0.5)
Eosinophils Relative: 3 %
HCT: 29.4 % — ABNORMAL LOW (ref 39.0–52.0)
Hemoglobin: 9.8 g/dL — ABNORMAL LOW (ref 13.0–17.0)
Immature Granulocytes: 2 %
Lymphocytes Relative: 13 %
Lymphs Abs: 1.2 10*3/uL (ref 0.7–4.0)
MCH: 31.5 pg (ref 26.0–34.0)
MCHC: 33.3 g/dL (ref 30.0–36.0)
MCV: 94.5 fL (ref 80.0–100.0)
Monocytes Absolute: 1 10*3/uL (ref 0.1–1.0)
Monocytes Relative: 10 %
Neutro Abs: 6.6 10*3/uL (ref 1.7–7.7)
Neutrophils Relative %: 71 %
Platelets: 359 10*3/uL (ref 150–400)
RBC: 3.11 MIL/uL — ABNORMAL LOW (ref 4.22–5.81)
RDW: 20.7 % — ABNORMAL HIGH (ref 11.5–15.5)
WBC: 9.3 10*3/uL (ref 4.0–10.5)
nRBC: 0 % (ref 0.0–0.2)

## 2020-09-05 LAB — MAGNESIUM: Magnesium: 2.2 mg/dL (ref 1.7–2.4)

## 2020-09-05 NOTE — Progress Notes (Signed)
[facility] PROGRESS NOTE    Progress Note  Jeff Jordan  [id number] DOB: [birth year] DOA: 08/14/2020 PCP: Georganna Skeans, PA-C     Brief Narrative:   Jeff Jordan is an 65 y.o. male past medical history of pancreatic adenocarcinoma, diabetes mellitus type 2 essential hypertension presents with obstruction of the biliary tree due to adenocarcinoma after failed biliary stent placement by ERCP on 08/14/2020 and patient was referred for admission for IR consult.  Percutaneous drain placed on 08/16/2020 converted to internal and external biliary drain on 08/17/2020, who underwent cholangiogram on 08/17/2020, with biliary fluid growing Candida and staph epi, started on Diflucan and Rocephin on 08/24/2020, which were transitioned to Eraxis and IV Unasyn for which she completed 5-day treatment on 08/30/2020.  G-tube placed by IR on 08/27/2020.  IR Drain but the patient started to spike fever with a rising leukocytosis and tachycardia, repeated CT scan of the abdomen and pelvis showed a stable pancreatic head mass, decreased biliary distention pneumobilia and possible right colitis blood cultures were sent, ID recommended to restart IV Unasyn on 09/02/2020, biliary drain was on it And drain to gravity, for which he will continue IV Unasyn until 09/06/2020 has remained afebrile since then    Significant Events: 7/19-outpatient ERCP and biliary stent placement unsuccessful due to duodenal stenosis  Significant studies: -7/21-[facility] biliary drain,  -7/22-conversion to internal and external biliary drain  -7/28-IR cholangiogram -7/27-biliary fluid culture with Candida glabrata and staph epidermis.  Staph epidermis likely contaminant  Antibiotics: 8/4-completed 5 days of Eraxis and IV Unasyn 09/04/2020 started back on IV Unasyn complete course on 09/06/2020  Microbiology data: Blood cultures NGTD.  Procedures: -8/7-CT abdomen and pelvis concerning for new pneumobilia and right-sided colitis 8/7-IR  following-uncapped biliary drain.  Now drain to gravity. 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 He is now on IV Unasyn until 09/06/2020. Has remained afebrile pain control on oral medication. Discontinue Crestor.  Diarrhea/abdominal pain: Likely due to tube feedings.  Continue Lomotil.  Gastric outlet obstruction: G-tube placed by IR on 08/27/2020. Tolerating clears.  Chest pain/abdominal pain: Likely due to malignancy. Continue narcotics.  Hypervolemic hyponatremia: Resolved with free water flushes.  Controlled diabetes mellitus type 2 with hyperglycemia: On Jardiance metformin and Actos with an A1c of 5.3. While on tube feedings he is currently on long-acting insulin plus sliding scale blood glucose relatively well controlled.  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  Essential hypertension: On Avapro and Coreg pressures under excellent control discontinue Norvasc. Continue hydralazine IV as needed.  Electrolyte imbalance: Hypokalemia/hypophosphatemia repleted now resolved.  Non-anion gap metabolic acidosis: Likely due to diarrhea now resolved.  Hyperlipidemia: Discontinue statins.  GERD: Will change Protonix to liquid.  Oral thrush: He completed a course of Eraxis.  Generalized weakness: Physical therapy evaluated the patient recommended home health PT.  Goals of care: Poor prognosis, palliative care will need to follow-up as an outpatient.   DVT prophylaxis: lovenox Family Communication:none Status is: Inpatient  Remains inpatient appropriate because:Hemodynamically unstable  Dispo: The patient is from: Home              Anticipated d/c is to: Home              Patient currently is medically stable to d/c.   Difficult to place patient No    Code Status:     Code Status Orders  (From admission, onward)  Start     Ordered   08/14/20 1624  Full code  Continuous        08/14/20 1623           Code Status History     This patient has a current code status but no historical code status.         IV Access:   Peripheral IV   Procedures and diagnostic studies:   No results found.   Medical Consultants:   None.   Subjective:    Jeff Jordan pain control no diarrhea.  Objective:    Vitals:   09/04/20 0440 09/04/20 1617 09/04/20 2020 09/05/20 0544  BP: (!) 113/51 137/63 (!) 112/50 (!) 116/55  Pulse: 99 97 (!) 101 95  Resp: '18 20 19 18  '$ Temp: 98.2 F (36.8 C) 98.4 F (36.9 C) 98.3 F (36.8 C) 98 F (36.7 C)  TempSrc: Oral Oral Oral Oral  SpO2: 98% 98% 100% 100%  Weight:      Height:       SpO2: 100 % O2 Flow Rate (L/min): 2 L/min   Intake/Output Summary (Last 24 hours) at 09/05/2020 0805 Last data filed at 09/05/2020 [id number] Gross per 24 hour  Intake 13900 ml  Output 1576 ml  Net 12324 ml   Filed Weights   09/01/20 0500 09/02/20 0500 09/03/20 0500  Weight: 67.6 kg 67.7 kg 74.3 kg    Exam: General exam: In no acute distress, cachectic Respiratory system: Good air movement and clear to auscultation. Cardiovascular system: S1 & S2 heard, RRR. No JVD. Gastrointestinal system: Abdomen is nondistended, soft and nontender.  Extremities: No pedal edema. Skin: No rashes, lesions or ulcers Psychiatry: Judgement and insight appear normal. Mood & affect appropriate.    Data Reviewed:    Labs: Basic Metabolic Panel: Recent Labs  Lab 08/30/20 1036 08/31/20 0504 09/01/20 0450 09/02/20 0345 09/03/20 0056  NA 150* 147* 140 138 137  K 3.7 3.8 3.7 3.6 3.3*  CL 121* 120* 113* 111 109  CO2 22 20* 20* 20* 19*  GLUCOSE 126* 207* 178* 77 193*  BUN '14 13 16 17 18  '$ CREATININE 0.66 0.60* 0.65 0.51* 0.64  CALCIUM 9.2 8.9 8.7* 8.8* 8.6*  MG 2.2 2.2 2.1 2.3 2.1  PHOS 2.5 2.7 2.8 3.1 2.3*   GFR Estimated Creatinine Clearance: 93.8 mL/min (by C-G formula  based on SCr of 0.64 mg/dL). Liver Function Tests: Recent Labs  Lab 08/30/20 1036 08/31/20 0504 09/01/20 0450 09/02/20 0345 09/03/20 0056  AST 139* 128* 123* 130* 118*  ALT 127* 122* 116* 115* 99*  ALKPHOS 156* 151* 154* 155* 169*  BILITOT 9.8* 9.4* 8.9* 11.0* 10.6*  PROT 6.3* 6.0* 5.9* 6.3* 5.9*  ALBUMIN 2.2* 2.3* 2.1* 2.4* 1.9*   Recent Labs  Lab 09/03/20 0056  LIPASE 55*   Recent Labs  Lab 09/02/20 0345  AMMONIA 42*   Coagulation profile Recent Labs  Lab 09/02/20 0345  INR 1.4*   COVID-19 Labs  No results for input(s): DDIMER, FERRITIN, LDH, CRP in the last 72 hours.  Lab Results  Component Value Date   Delta NEGATIVE 08/14/2020    CBC: Recent Labs  Lab 08/30/20 1036 08/31/20 0504 09/01/20 0450 09/02/20 0508 09/03/20 0056  WBC 12.8* 13.7* 14.2* 16.1* 14.2*  NEUTROABS  --   --   --  12.6*  --   HGB 9.7* 9.7* 9.3* 9.2* 9.2*  HCT 29.5* 29.3* 27.5* 26.5* 27.0*  MCV 95.2 94.5 93.9 92.3 92.8  PLT 321  311 314 249 305   Cardiac Enzymes: No results for input(s): CKTOTAL, CKMB, CKMBINDEX, TROPONINI in the last 168 hours. BNP (last 3 results) No results for input(s): PROBNP in the last 8760 hours. CBG: Recent Labs  Lab 09/04/20 1757 09/04/20 2022 09/04/20 2358 09/05/20 0357 09/05/20 0740  GLUCAP 272* 122* 181* 177* 185*   D-Dimer: No results for input(s): DDIMER in the last 72 hours. Hgb A1c: No results for input(s): HGBA1C in the last 72 hours. Lipid Profile: No results for input(s): CHOL, HDL, LDLCALC, TRIG, CHOLHDL, LDLDIRECT in the last 72 hours. Thyroid function studies: No results for input(s): TSH, T4TOTAL, T3FREE, THYROIDAB in the last 72 hours.  Invalid input(s): FREET3 Anemia work up: No results for input(s): VITAMINB12, FOLATE, FERRITIN, TIBC, IRON, RETICCTPCT in the last 72 hours. Sepsis Labs: Recent Labs  Lab 08/31/20 0504 09/01/20 0450 09/01/20 2247 09/02/20 0345 09/02/20 0508 09/02/20 2011 09/03/20 0056  09/03/20 0556  WBC 13.7* 14.2*  --   --  16.1*  --  14.2*  --   LATICACIDVEN  --   --    < > 2.0*  --  1.6 2.0* 1.5   < > = values in this interval not displayed.   Microbiology Recent Results (from the past 240 hour(s))  Culture, blood (routine x 2)     Status: None   Collection Time: 08/26/20  2:11 PM   Specimen: BLOOD  Result Value Ref Range Status   Specimen Description   Final    BLOOD BLOOD RIGHT HAND Performed at [facility], [place] [address], [place], [place] [address]    Special Requests   Final    BOTTLES DRAWN AEROBIC ONLY Blood Culture adequate volume Performed at [place] [address], [place], [place] [address]    Culture   Final    NO GROWTH 5 DAYS Performed at [facility], [place] [address], [place], [place] [address]    Report Status 08/31/2020 FINAL  Final  Culture, blood (routine x 2)     Status: None (Preliminary result)   Collection Time: 09/01/20 10:47 PM   Specimen: BLOOD  Result Value Ref Range Status   Specimen Description   Final    BLOOD BLOOD LEFT HAND Performed at [facility] [address] [place], [place] [address]    Special Requests   Final    BOTTLES DRAWN AEROBIC AND ANAEROBIC Blood Culture results may not be optimal due to an inadequate volume of blood received in culture bottles Performed at [place] [address], [place], [place] [address]    Culture   Final    NO GROWTH 2 DAYS Performed at [facility] [address] [place], [place] [address]    Report Status PENDING  Incomplete  Culture, blood (routine x 2)     Status: None (Preliminary result)   Collection Time: 09/01/20 10:47 PM   Specimen: BLOOD  Result Value Ref Range Status   Specimen Description   Final    BLOOD BLOOD LEFT HAND Performed at [facility] [address], [place], [place] [address]    Special Requests   Final    BOTTLES DRAWN AEROBIC  ONLY Blood Culture results may not be optimal due to an inadequate volume of blood received in culture bottles Performed at [facility] [address], [place],  [address]    Culture   Final    NO GROWTH  2 DAYS Performed at [facility], [place] [address], [place], [place] [address]    Report Status PENDING  Incomplete     Medications:    amLODipine  5 mg Oral Daily   carvedilol  6.25 mg Oral BID   cholestyramine light  4 g Oral BID   diphenoxylate-atropine  1 tablet Oral QID   feeding supplement  1 Container Oral TID BM   feeding supplement (PROSource TF)  45 mL Per Tube TID   ferrous sulfate  325 mg Oral Q breakfast   free water  200 mL Per Tube Q4H   insulin aspart  0-15 Units Subcutaneous TID WC   insulin aspart  0-5 Units Subcutaneous QHS   insulin aspart  4 Units Subcutaneous Q4H   insulin detemir  25 Units Subcutaneous BID   irbesartan  300 mg Oral Daily   pantoprazole  40 mg Intravenous Q1400   sodium chloride flush  10-40 mL Intracatheter Q12H   sodium chloride flush  5 mL Intracatheter Q8H   sodium chloride flush  5 mL Intracatheter Q8H   Continuous Infusions:  ampicillin-sulbactam (UNASYN) IV 3 g (09/05/20 0602)   feeding supplement (OSMOLITE 1.5 CAL) 55 mL/hr at 09/02/20 2200      LOS: 21 days   Charlynne Cousins  [facility]  09/05/2020, 8:05 AM

## 2020-09-05 NOTE — Progress Notes (Signed)
Patient ID: Jeff Jordan, male   DOB: January 20, 1956, 65 y.o.   MRN: [id number]    Referring Physician(s): Dr. Olevia Bowens  Supervising Physician: Corrie Mckusick  Patient Status:  [facility] - In-pt  Chief Complaint:  Pancreatic cancer, HTN, DM II and FTT. Pt had biliary drain placed 08/16/2020 d/t cholangitis and sepsis. G-tube placed 08/27/20.     Subjective:  Pt resting quietly in bed. He answers all questions appropriately. He is calm, pleasant and cooperative. Pt denies pain/discomfort today. He is in NAD.   Allergies: Patient has no known allergies.  Medications: Prior to Admission medications   Medication Sig Start Date End Date Taking? Authorizing Provider  amLODipine (NORVASC) 5 MG tablet Take 5 mg by mouth daily. 06/19/20  Yes [provider]  aspirin 81 MG EC tablet Take 81 mg by mouth daily.   Yes [provider]  ferrous sulfate 325 (65 FE) MG tablet Take 325 mg by mouth daily with breakfast.   Yes [provider]  JARDIANCE 25 MG TABS tablet Take 25 mg by mouth daily. 07/23/20  Yes [provider]  loperamide (IMODIUM) 2 MG capsule Take by mouth as needed for diarrhea or loose stools.   Yes [provider]  metFORMIN (GLUCOPHAGE) 1000 MG tablet Take 1,000 mg by mouth 2 (two) times daily. 07/16/20  Yes [provider]  olmesartan (BENICAR) 40 MG tablet Take 40 mg by mouth daily. 06/25/20  Yes [provider]  omeprazole (PRILOSEC) 40 MG capsule Take 40 mg by mouth every morning. 03/01/20  Yes [provider]  ondansetron (ZOFRAN) 4 MG tablet Take 1 tablet (4 mg total) by mouth every 4 (four) hours as needed for nausea. 08/02/20  Yes Dayton Scrape A, NP  Oxycodone HCl 10 MG TABS Take 1 tablet (10 mg total) by mouth every 4 (four) hours as needed. Patient taking differently: Take 10 mg by mouth every 4 (four) hours as needed (pain). 08/02/20  Yes Dayton Scrape A, NP  pioglitazone (ACTOS) 45 MG tablet Take 45 mg by mouth  daily. 06/25/20  Yes [provider]  rosuvastatin (CRESTOR) 5 MG tablet Take 5 mg by mouth once a week. 06/25/20  Yes [provider]     Vital Signs: BP (!) 116/55 (BP Location: Right Arm)   Pulse 95   Temp 98 F (36.7 C) (Oral)   Resp 18   Ht '5\' 10"'$  (1.778 m)   Wt 163 lb 12.8 oz (74.3 kg)   SpO2 100%   BMI 23.50 kg/m   Physical Exam Constitutional:      Appearance: He is ill-appearing.  Eyes:     General: No scleral icterus. Pulmonary:     Effort: Pulmonary effort is normal.  Skin:    General: Skin is warm and dry.     Comments: Biliary drain site/dressing C/D/I, no leaking, erythema or s/sx of infection. ~100 cc granular, yellow fluid in bag. G-tube in place receiving tube feed  Neurological:     Mental Status: He is alert.  Psychiatric:        Mood and Affect: Mood normal.        Behavior: Behavior normal.    Imaging: CT ABDOMEN PELVIS W CONTRAST  Result Date: 09/02/2020 CLINICAL DATA:  Abdominal abscess/infection suspected. Right upper quadrant pain EXAM: CT ABDOMEN AND PELVIS WITH CONTRAST TECHNIQUE: Multidetector CT imaging of the abdomen and pelvis was performed using the standard protocol following bolus administration of intravenous contrast. CONTRAST:  53m OMNIPAQUE IOHEXOL 350 MG/ML  SOLN COMPARISON:  07/27/2020 FINDINGS: Lower chest: Small bilateral pleural effusions. Bibasilar dependent atelectasis. Heart is normal size. Hepatobiliary: Percutaneous transpedicle biliary stent is in place terminating in the duodenum. There is pneumobilia. Gas also noted within the gallbladder. No visible suspicious focal hepatic abnormality. Pancreas: Again seen is the infiltrating mass in the region of the pancreatic head, not significantly changed since prior study. Pancreatic ductal dilatation, stable. Spleen: No focal abnormality.  Normal size. Adrenals/Urinary Tract: No adrenal abnormality. No focal renal abnormality. No stones or hydronephrosis. Urinary bladder  is unremarkable. Stomach/Bowel: There is sigmoid diverticulosis. No active diverticulitis. Wall thickening noted in the cecum and ascending colon concerning for colitis. No evidence of bowel obstruction. Gastrostomy tube within the stomach. Vascular/Lymphatic: Aortic atherosclerosis. No aneurysm. Prominent upper abdominal lymph nodes. Portacaval lymph node with a short axis diameter of 12 mm again seen, not significantly changed. Mildly prominent central mesenteric lymph nodes are also stable. Reproductive: Prostate enlargement. Other: Moderate free fluid in the pelvis. Musculoskeletal: No acute bony abnormality. IMPRESSION: Stable pancreatic head mass. Interval percutaneous transhepatic biliary drain placement. Decreasing biliary distension. Pneumobilia now present. Wall thickening noted within the right colon concerning for colitis. Sigmoid diverticulosis.  No active diverticulitis. Gastrostomy tube in the stomach. Small bilateral effusions, bibasilar atelectasis. Aortic atherosclerosis. Electronically Signed   By: Rolm Baptise M.D.   On: 09/02/2020 01:17   DG ABD ACUTE 2+V W 1V CHEST  Result Date: 09/01/2020 CLINICAL DATA:  Fever EXAM: DG ABDOMEN ACUTE WITH 1 VIEW CHEST COMPARISON:  08/22/2020 FINDINGS: Percutaneous biliary stent noted in the right abdomen. Nonobstructive bowel gas pattern. No free air or suspicious calcification. Visualized lung bases clear. IMPRESSION: No acute findings. Electronically Signed   By: Rolm Baptise M.D.   On: 09/01/2020 21:59    Labs:  CBC: Recent Labs    09/01/20 0450 09/02/20 0508 09/03/20 0056 09/05/20 0842  WBC 14.2* 16.1* 14.2* 9.3  HGB 9.3* 9.2* 9.2* 9.8*  HCT 27.5* 26.5* 27.0* 29.4*  PLT 314 249 305 359    COAGS: Recent Labs    08/15/20 0452 08/16/20 0553 08/27/20 1015 09/02/20 0345  INR 1.5* 1.1 1.2 1.4*    BMP: Recent Labs    09/01/20 0450 09/02/20 0345 09/03/20 0056 09/05/20 0842  NA 140 138 137 144  K 3.7 3.6 3.3* 4.5  CL 113* 111  109 116*  CO2 20* 20* 19* 17*  GLUCOSE 178* 77 193* 210*  BUN '16 17 18 '$ 24*  CALCIUM 8.7* 8.8* 8.6* 8.7*  CREATININE 0.65 0.51* 0.64 0.71  GFRNONAA >60 >60 >60 >60    LIVER FUNCTION TESTS: Recent Labs    09/01/20 0450 09/02/20 0345 09/03/20 0056 09/05/20 0842  BILITOT 8.9* 11.0* 10.6* 7.7*  AST 123* 130* 118* 125*  ALT 116* 115* 99* 100*  ALKPHOS 154* 155* 169* 168*  PROT 5.9* 6.3* 5.9* 6.0*  ALBUMIN 2.1* 2.4* 1.9* 2.2*    Assessment and Plan:  Pancreatic cancer, HTN, DM II and FTT. Pt had biliary drain placed 08/16/2020 d/t cholangitis and sepsis. G-tube placed 08/27/20.  ~100cc granular, yellow fluid in bag w/ 425cc recorded in Epic. Pt reports the RN flushed his drain this morning. Dressing/site C/D/I. Pt afebrile. G tube in place, pt receiving tube feed. D/t poor prognosis, pt referred to OP palliative care.  WBC 9.3 down from 14.2 8/8 Tbili 7.7 down from 10.6 8/8 AST 125 from 118 8/8 ALT 100 from 118 8/8 Alkphos 168 from 169 8/8 NA and K+ WNL Orders to continue flushing  biliary drain TID, recoding OP q shift and change dressing as needed. IR to follow.  Electronically Signed: Tyson Alias, NP 09/05/2020, 10:04 AM   I spent a total of 20 minutes at the the patient's bedside AND on the patient's hospital floor or unit, greater than 50% of which was counseling/coordinating care for biliary drain and G tube assessment.

## 2020-09-05 NOTE — Progress Notes (Signed)
Pt Physical Therapy Treatment Patient Details Name: Jeff Jordan MRN: [id number] DOB: 04-28-1955 Today's Date: 09/05/2020    History of Present Illness 65 year old M with PMH of pancreatic adenocarcinoma, DM-2, HTN and FTT presenting for biliary obstruction due to pancreatic adenocarcinoma after he failed biliary stent placement by ERCP at GI office on 7/19.  He had percutaneous drain placed on 7/21 that was converted to internal and external biliary drain on 7/22.  He underwent cholangiogram on 7/28.  Biliary fluid with Candida glabrata and staph epidermis.  Started on Diflucan and ceftriaxone on 7/29 which were since changed to Eraxis and IV Unasyn per ID recommendation.  LFT and bilirubin improving.  G-tube placed by IR on 8/1.  Tube feeds restarted.    PT Comments    Pt showing a decline in his mobility.  Last week he was easily amb >100 feet.  Past 2 sessions have been limited distance due to increased c/o fatigue/weakness.  Pt is slow and sluggish requiring freq rest breaks. Plan is for pt to D/C to home but he is not progressing with his mobility as he was.    Follow Up Recommendations  Home health PT     Equipment Recommendations  Rolling walker with 5" wheels;3in1 (PT)    Recommendations for Other Services       Precautions / Restrictions Precautions Precautions: Fall Precaution Comments: bilary drain on R, G tube, abdominal binder    Mobility  Bed Mobility Overal bed mobility: Needs Assistance Bed Mobility: Supine to Sit;Sit to Supine     Supine to sit: Min assist Sit to supine: Min assist;Mod assist   General bed mobility comments: supine to sit Min Assist for upper body and sit to supine assist for B LE up onto bed with increased time    Transfers Overall transfer level: Needs assistance Equipment used: Rolling walker (2 wheeled) Transfers: Sit to/from Stand Sit to Stand: Min guard;Min assist Stand pivot transfers: Min assist       General transfer  comment: increased time and one VC to avoid pulling up on walker  Ambulation/Gait Ambulation/Gait assistance: Supervision;Min guard Gait Distance (Feet): 37 Feet Assistive device: Rolling walker (2 wheeled) Gait Pattern/deviations: Step-through pattern;Decreased stride length;Trunk flexed;Shuffle Gait velocity: decreased   General Gait Details: slow but steady  Slight forward flex posture due to ABD discomfort. decreased amb distance this session due to increased c/o fatigue/weakness.   Stairs             Wheelchair Mobility    Modified Rankin (Stroke Patients Only)       Balance                                            Cognition Arousal/Alertness: Awake/alert Behavior During Therapy: WFL for tasks assessed/performed Overall Cognitive Status: Within Functional Limits for tasks assessed                                 General Comments: AxO x 3 pleasant and always willing but present with increased c/o weakness/fatigue (pt has had several days of loose BM's)      Exercises      General Comments        Pertinent Vitals/Pain Pain Assessment: No/denies pain    Home Living  Prior Function            PT Goals (current goals can now be found in the care plan section)      Frequency    Min 3X/week      PT Plan Current plan remains appropriate    Co-evaluation              AM-PAC PT "6 Clicks" Mobility   Outcome Measure  Help needed turning from your back to your side while in a flat bed without using bedrails?: A Little Help needed moving from lying on your back to sitting on the side of a flat bed without using bedrails?: A Little Help needed moving to and from a bed to a chair (including a wheelchair)?: A Little Help needed standing up from a chair using your arms (e.g., wheelchair or bedside chair)?: A Little Help needed to walk in hospital room?: A Little Help needed climbing  3-5 steps with a railing? : A Little 6 Click Score: 18    End of Session Equipment Utilized During Treatment: Gait belt Activity Tolerance: Patient limited by fatigue Patient left: in bed;with call bell/phone within reach;with bed alarm set   PT Visit Diagnosis: Difficulty in walking, not elsewhere classified (R26.2)     Time: [id number] PT Time Calculation (min) (ACUTE ONLY): 27 min  Charges:  $Gait Training: 8-22 mins $Therapeutic Activity: 8-22 mins                     {Stefanie Hodgens  PTA Acute  Rehabilitation Services Pager      [phone] Office      [phone]

## 2020-09-05 NOTE — Progress Notes (Signed)
Nutrition Follow-up  DOCUMENTATION CODES:  Non-severe (moderate) malnutrition in context of chronic illness  INTERVENTION:  Continue Osmolite 1.5 @ 55 ml/hr via G-tube 45 ml Prosource TF TID, each provides 40 kcals and 11g protein Free water flushes of 250 ml every 4 hours (1500 ml) Provides 2100 kcals, 115g protein and 2505 ml H2O   TF recommendations for after discharge: Isosource 1.5 @ 60 ml/hr via G-tube (6 cartons total) Provides 2250 kcals, 102g protein and 1146 ml H2O  NUTRITION DIAGNOSIS:  Moderate Malnutrition related to chronic illness, cancer and cancer related treatments as evidenced by mild fat depletion, mild muscle depletion, moderate muscle depletion. - ongoing  GOAL:  Patient will meet greater than or equal to 90% of their needs - meeting with TF  MONITOR:  TF tolerance, Diet advancement, PO intake, Labs, Weight trends  REASON FOR ASSESSMENT:  Consult Enteral/tube feeding initiation and management  ASSESSMENT:  65 year old male with past medical history of hypertension and diabetes mellitus with recent diagnosis of pancreatic mass suspected to be pancreatic adenocarcinoma referred to Dr. Allison Quarry of gastroenterology for stent placement for obstruction and underwent ERCP on 7/19 with stent unable to be placed.  Patient was then directly admitted to the hospitalist service and seen by interventional radiology with plans for stent placement today.  In the interim, biopsy results positive for pancreatic adenocarcinoma. 7/19 - EGD 7/21 - biliary drain placement 7/27 - NGT placed for TF initiation 7/31 - NGT clogged 8/1 - G-tube placement; NGT removed  Pt continues to take some clear liquids PO, ranging in amount. TF continues to run at goal rate, however, per MD, pt has been having more diarrhea.  Spoke with pt at bedside. Pt denies any GI distress that bothers him. He reports feeling well and enjoying the clear liquid diet at present.  Current TF  regimen: Osmolite 1.5 @ 55 ml/hr via G-tube 45 ml Prosource TF TID, each provides 40 kcals and 11g protein Free water flushes of 250 ml every 4 hours (1500 ml) Provides 2100 kcals, 115g protein and 2505 ml H2O  Admit wt: 68 kg Current wt: 74.3 kg  Medications: reviewed; Prevalite powder BID, Lomotil QID, BB TID, PS TF TID, ferrous sulfate, SSI, mealtime Novolog, bedtime Novolog, Levemir, Protonix via IV, Unasyn QID via IV, Imodium PRN (given once today)  Labs: reviewed; CBG 122-272  Diet Order:   Diet Order             Diet clear liquid Room service appropriate? Yes; Fluid consistency: Thin  Diet effective now                  EDUCATION NEEDS:  Education needs have been addressed  Skin:  Skin Assessment: Reviewed RN Assessment  Last BM:  09/05/20 - Type 7, medium  Height:  Ht Readings from Last 1 Encounters:  08/14/20 '5\' 10"'$  (1.778 m)   Weight:  Wt Readings from Last 1 Encounters:  09/03/20 74.3 kg   BMI:  Body mass index is 23.5 kg/m.  Estimated Nutritional Needs:  Kcal:  2000-2200 Protein:  105-120g Fluid:  2.2L/day  Derrel Nip, RD, LDN (she/her/hers) Registered Dietitian I After-Hours/Weekend Pager # in [place]

## 2020-09-05 NOTE — Consult Note (Signed)
Consultation Note Date: 09/05/2020   Patient Name: Jeff Jordan  DOB: 08-28-1955  MRN: [id number]  Age / Sex: 65 y.o., male  PCP: Nolen Mu Referring Physician: Charlynne Cousins, MD  Reason for Consultation: Establishing goals of care  HPI/Patient Profile: 65 y.o. male  with past medical history of pancreatic adenocarcinoma, diabetes, hypertension, and failure to thrive who presented with biliary obstruction secondary to pancreatic adenocarcinoma.  He initially had failed biliary stent placement at GI office and percutaneous drain was placed on 7/21 that was converted to internal and external drain on 7/22.  He was started on Diflucan and ceftriaxone that was converted to Eraxis and Unasyn and completed course on 8/4.  He had a G-tube placed by IR in 8/1.  He is currently on tube feeds and clear liquid diet.  I restarted capping trial but he spiked a fever with leukocytosis and tachycardia and was restarted on antibiotics.  Biliary drain is uncapped and draining to gravity.  He is still not been evaluated by oncology and plan is for him to follow-up with oncology in [facility] as an outpatient.  Palliative consulted for goals of care.  Clinical Assessment and Goals of Care: I met today with Jeff Jordan. We discussed clinical course as well as wishes moving forward in regard to advanced directives.  Concepts specific to code status and rehospitalization discussed.  We discussed difference between a aggressive medical intervention path and a palliative, comfort focused care path.  Values and goals of care important to patient and family were attempted to be elicited.   Concept of Hospice and Palliative Care were discussed.  At this point, Jeff Jordan reports that he has a new diagnosis his cancer and has not yet even had the opportunity to speak to an oncologist.  He feels that he needs to have the opportunity  to discuss with an oncologist prior to making decisions about long-term goals of care.  Plan is for outpatient follow-up with oncologist in [place].  Questions and concerns addressed.   PMT will continue to support holistically.   SUMMARY OF RECOMMENDATIONS   - Full code/full scope - Jeff Jordan has new diagnosis of pancreatic adenocarcinoma.  I talked with him today about his multiple comorbidities and concerned about his overall prognosis.  He was tired but did seem to follow conversation.  He tells me that he has not yet seen an oncologist and feels that he needs to have an appointment with an oncologist (plan is for follow-up in [place] as an outpatient) to discuss prior to making any other decisions about long-term goals of care. - Recommend outpatient palliative care follow-up once he has had the opportunity to discuss with oncologist.  Will continue to follow peripherally and progress conversation as able based upon his clinical course this admission.  Code Status/Advance Care Planning: Full code  Psycho-social/Spiritual:  Desire for further Chaplaincy support:no Additional Recommendations: Caregiving  Support/Resources  Prognosis:  Guarded  Discharge Planning: To Be Determined      Primary Diagnoses: Present  on Admission:  Pancreatic adenocarcinoma [facility])  Essential hypertension  Biliary obstruction   I have reviewed the medical record, interviewed the patient and family, and examined the patient. The following aspects are pertinent.  Past Medical History:  Diagnosis Date   Biliary obstruction    Essential hypertension    Gastroesophageal reflux    Hepatic steatosis    Mild hyperlipidemia    Type 2 diabetes mellitus (HCC)    Social History   Socioeconomic History   Marital status: Divorced    Spouse name: Not on file   Number of children: 1   Years of education: Not on file   Highest education level: Not on file  Occupational History   Not on file  Tobacco  Use   Smoking status: Former    Packs/day: 1.00    Years: 12.00    Pack years: 12.00    Types: Cigarettes    Quit date: 01/28/2000    Years since quitting: 20.6   Smokeless tobacco: Never  Vaping Use   Vaping Use: Never used  Substance and Sexual Activity   Alcohol use: Not Currently    Comment: quit 2002 usednto drink alot   Drug use: Not Currently   Sexual activity: Yes  Other Topics Concern   Not on file  Social History Narrative   Not on file   Social Determinants of Health   Financial Resource Strain: Not on file  Food Insecurity: Not on file  Transportation Needs: Not on file  Physical Activity: Not on file  Stress: Not on file  Social Connections: Not on file   Family History  Family history unknown: Yes   Scheduled Meds:  amLODipine  5 mg Oral Daily   carvedilol  6.25 mg Oral BID   cholestyramine light  4 g Oral BID   diphenoxylate-atropine  1 tablet Oral QID   feeding supplement  1 Container Oral TID BM   feeding supplement (PROSource TF)  45 mL Per Tube TID   ferrous sulfate  325 mg Oral Q breakfast   free water  200 mL Per Tube Q4H   insulin aspart  0-15 Units Subcutaneous TID WC   insulin aspart  0-5 Units Subcutaneous QHS   insulin aspart  4 Units Subcutaneous Q4H   insulin detemir  25 Units Subcutaneous BID   irbesartan  300 mg Oral Daily   pantoprazole  40 mg Intravenous Q1400   sodium chloride flush  10-40 mL Intracatheter Q12H   sodium chloride flush  5 mL Intracatheter Q8H   sodium chloride flush  5 mL Intracatheter Q8H   Continuous Infusions:  ampicillin-sulbactam (UNASYN) IV 3 g (09/05/20 0602)   feeding supplement (OSMOLITE 1.5 CAL) 55 mL/hr at 09/02/20 2200   PRN Meds:.doxylamine (Sleep), labetalol, loperamide HCl, morphine injection, ondansetron (ZOFRAN) IV, oxyCODONE, sodium chloride flush Medications Prior to Admission:  Prior to Admission medications   Medication Sig Start Date End Date Taking? Authorizing Provider  amLODipine  (NORVASC) 5 MG tablet Take 5 mg by mouth daily. 06/19/20  Yes [provider]  aspirin 81 MG EC tablet Take 81 mg by mouth daily.   Yes [provider]  ferrous sulfate 325 (65 FE) MG tablet Take 325 mg by mouth daily with breakfast.   Yes [provider]  JARDIANCE 25 MG TABS tablet Take 25 mg by mouth daily. 07/23/20  Yes [provider]  loperamide (IMODIUM) 2 MG capsule Take by mouth as needed for diarrhea or loose stools.  Yes [provider]  metFORMIN (GLUCOPHAGE) 1000 MG tablet Take 1,000 mg by mouth 2 (two) times daily. 07/16/20  Yes [provider]  olmesartan (BENICAR) 40 MG tablet Take 40 mg by mouth daily. 06/25/20  Yes [provider]  omeprazole (PRILOSEC) 40 MG capsule Take 40 mg by mouth every morning. 03/01/20  Yes [provider]  ondansetron (ZOFRAN) 4 MG tablet Take 1 tablet (4 mg total) by mouth every 4 (four) hours as needed for nausea. 08/02/20  Yes Dayton Scrape A, NP  Oxycodone HCl 10 MG TABS Take 1 tablet (10 mg total) by mouth every 4 (four) hours as needed. Patient taking differently: Take 10 mg by mouth every 4 (four) hours as needed (pain). 08/02/20  Yes Dayton Scrape A, NP  pioglitazone (ACTOS) 45 MG tablet Take 45 mg by mouth daily. 06/25/20  Yes [provider]  rosuvastatin (CRESTOR) 5 MG tablet Take 5 mg by mouth once a week. 06/25/20  Yes [provider]   No Known Allergies Review of Systems  Constitutional:  Positive for activity change, appetite change, fatigue and fever.  Gastrointestinal:  Positive for abdominal pain and nausea.  Neurological:  Positive for weakness.  Psychiatric/Behavioral:  Positive for sleep disturbance.    Physical Exam General: Alert, awake, in no acute distress.   HEENT: No bruits, no goiter, no JVD Heart: Regular rate and rhythm. No murmur appreciated. Lungs: Good air movement, clear Ext: No significant edema Skin: Warm and dry Neuro:  Grossly intact, nonfocal.   Vital Signs: BP (!) 116/55 (BP Location: Right Arm)   Pulse 95   Temp 98 F (36.7 C) (Oral)   Resp 18   Ht _0  (1.778 m)   Wt 74.3 kg   SpO2 100%   BMI 23.50 kg/m  Pain Scale: 0-10 POSS *See Group Information*: 1-Acceptable,Awake and alert Pain Score: 0-No pain   SpO2: SpO2: 100 % O2 Device:SpO2: 100 % O2 Flow Rate: .O2 Flow Rate (L/min): 2 L/min  IO: Intake/output summary:  Intake/Output Summary (Last 24 hours) at 09/05/2020 0805 Last data filed at 09/05/2020 6803 Gross per 24 hour  Intake 13900 ml  Output 1576 ml  Net 12324 ml    LBM: Last BM Date: 09/05/20 Baseline Weight: Weight: 72.6 kg Most recent weight: Weight: 74.3 kg     Palliative Assessment/Data:   Flowsheet Rows    Flowsheet Row Most Recent Value  Intake Tab   Referral Department Hospitalist  Unit at Time of Referral Oncology Unit  Palliative Care Primary Diagnosis Cancer  Date Notified 09/03/20  Palliative Care Type New Palliative care  Reason for referral Clarify Goals of Care  Date of Admission 08/14/20  Date first seen by Palliative Care 09/04/20  # of days Palliative referral response time 1 Day(s)  # of days IP prior to Palliative referral 20  Clinical Assessment   Palliative Performance Scale Score 50%  Psychosocial & Spiritual Assessment   Palliative Care Outcomes   Patient/Family meeting held? Yes  Who was at the meeting? Patient  Palliative Care Outcomes Clarified goals of care       Time In: 1800 Time Out: 1855 Time Total: 55 Greater than 50%  of this time was spent counseling and coordinating care related to the above assessment and plan.  Signed by: Micheline Rough, MD   Please contact Palliative Medicine Team phone at [phone] for questions and concerns.  For individual provider: See Shea Evans

## 2020-09-06 DIAGNOSIS — Z0189 Encounter for other specified special examinations: Secondary | ICD-10-CM | POA: Diagnosis not present

## 2020-09-06 DIAGNOSIS — K831 Obstruction of bile duct: Secondary | ICD-10-CM | POA: Diagnosis not present

## 2020-09-06 DIAGNOSIS — K8309 Other cholangitis: Secondary | ICD-10-CM | POA: Diagnosis not present

## 2020-09-06 DIAGNOSIS — E119 Type 2 diabetes mellitus without complications: Secondary | ICD-10-CM | POA: Diagnosis not present

## 2020-09-06 DIAGNOSIS — Z4659 Encounter for fitting and adjustment of other gastrointestinal appliance and device: Secondary | ICD-10-CM

## 2020-09-06 LAB — GLUCOSE, CAPILLARY
Glucose-Capillary: 102 mg/dL — ABNORMAL HIGH (ref 70–99)
Glucose-Capillary: 59 mg/dL — ABNORMAL LOW (ref 70–99)
Glucose-Capillary: 129 mg/dL — ABNORMAL HIGH (ref 70–99)

## 2020-09-06 MED ORDER — OSMOLITE 1.5 CAL PO LIQD: 1000.0000 mL | ORAL | 0 refills | Status: AC

## 2020-09-06 MED ORDER — DEXTROSE 50 % IV SOLN
INTRAVENOUS | Status: AC
  Administered 2020-09-06: 50 mL
  Filled 2020-09-06: qty 50

## 2020-09-06 MED ORDER — IRBESARTAN 300 MG PO TABS: 300.0000 mg | ORAL_TABLET | Freq: Every day | ORAL | 1 refills | Status: DC

## 2020-09-06 MED ORDER — FREE WATER: 200.0000 mL | Status: AC

## 2020-09-06 MED ORDER — CARVEDILOL 6.25 MG PO TABS: 6.2500 mg | ORAL_TABLET | Freq: Two times a day (BID) | ORAL | 3 refills | Status: AC

## 2020-09-06 MED ORDER — PROSOURCE TF PO LIQD: 45.0000 mL | Freq: Three times a day (TID) | ORAL | Status: DC

## 2020-09-06 MED ORDER — INSULIN DETEMIR 100 UNIT/ML FLEXPEN: 40.0000 [IU] | PEN_INJECTOR | Freq: Every day | SUBCUTANEOUS | 11 refills | Status: DC

## 2020-09-06 MED ORDER — AMOXICILLIN-POT CLAVULANATE 875-125 MG PO TABS
1.0000 | ORAL_TABLET | Freq: Two times a day (BID) | ORAL | Status: DC
Start: 2020-09-06 — End: 2020-09-06
  Administered 2020-09-06: 1 via ORAL
  Filled 2020-09-06: qty 1

## 2020-09-06 MED ORDER — INSULIN ASPART 100 UNIT/ML FLEXPEN: 5.0000 [IU] | PEN_INJECTOR | Freq: Three times a day (TID) | SUBCUTANEOUS | 11 refills | Status: DC

## 2020-09-06 MED ORDER — INSULIN PEN NEEDLE 31G X 6 MM MISC: 1.0000 | Freq: Two times a day (BID) | 3 refills | Status: DC

## 2020-09-06 MED ORDER — AMOXICILLIN-POT CLAVULANATE 875-125 MG PO TABS: 1.0000 | ORAL_TABLET | Freq: Two times a day (BID) | ORAL | 0 refills | Status: AC

## 2020-09-06 MED ORDER — INSULIN DETEMIR 100 UNIT/ML ~~LOC~~ SOLN
22.0000 [IU] | Freq: Two times a day (BID) | SUBCUTANEOUS | Status: DC
  Filled 2020-09-06: qty 0.22

## 2020-09-06 NOTE — Progress Notes (Signed)
Date and time results received: 09/06/20 0810 (use smartphrase ".now" to insert current time)  Test:cbg Critical Value: 1  Name of Provider Notified: Charlynne Cousins , MD  Orders Received? Or Actions Taken?: YES

## 2020-09-06 NOTE — Progress Notes (Signed)
Blood sugar this morning was 55 , half of D50 given and rechecked in 15 min . Blood now is 129 . MD notified. No new orders .

## 2020-09-06 NOTE — Progress Notes (Signed)
Patient ID: Jeff Jordan, male   DOB: 1955-11-09, 65 y.o.   MRN: [id number]    Referring Physician(s): Dr. Olevia Bowens  Supervising Physician: Juliet Rude  Patient Status:  [facility] - In-pt  Chief Complaint:  Pancreatic cancer, HTN, DM II and FTT. Pt had biliary drain placed 08/16/2020 d/t cholangitis and sepsis. G-tube placed 08/27/20.   Subjective: Pt sitting up in recliner. He is alert, calm and cooperative. He answers all questions appropriately. He states he is going to home today. He is in NAD   Allergies: Patient has no known allergies.  Medications: Prior to Admission medications   Medication Sig Start Date End Date Taking? Authorizing Provider  amLODipine (NORVASC) 5 MG tablet Take 5 mg by mouth daily. 06/19/20  Yes [provider]  ferrous sulfate 325 (65 FE) MG tablet Take 325 mg by mouth daily with breakfast.   Yes [provider]  JARDIANCE 25 MG TABS tablet Take 25 mg by mouth daily. 07/23/20  Yes [provider]  loperamide (IMODIUM) 2 MG capsule Take by mouth as needed for diarrhea or loose stools.   Yes [provider]  metFORMIN (GLUCOPHAGE) 1000 MG tablet Take 1,000 mg by mouth 2 (two) times daily. 07/16/20  Yes [provider]  olmesartan (BENICAR) 40 MG tablet Take 40 mg by mouth daily. 06/25/20  Yes [provider]  omeprazole (PRILOSEC) 40 MG capsule Take 40 mg by mouth every morning. 03/01/20  Yes [provider]  ondansetron (ZOFRAN) 4 MG tablet Take 1 tablet (4 mg total) by mouth every 4 (four) hours as needed for nausea. 08/02/20  Yes Dayton Scrape A, NP  Oxycodone HCl 10 MG TABS Take 1 tablet (10 mg total) by mouth every 4 (four) hours as needed. Patient taking differently: Take 10 mg by mouth every 4 (four) hours as needed (pain). 08/02/20  Yes Dayton Scrape A, NP  pioglitazone (ACTOS) 45 MG tablet Take 45 mg by mouth daily. 06/25/20  Yes [provider]  amoxicillin-clavulanate (AUGMENTIN)  875-125 MG tablet Take 1 tablet by mouth every 12 (twelve) hours for 5 days. 09/06/20 09/11/20  Charlynne Cousins, MD  carvedilol (COREG) 6.25 MG tablet Take 1 tablet (6.25 mg total) by mouth 2 (two) times daily. 09/06/20   Charlynne Cousins, MD  irbesartan (AVAPRO) 300 MG tablet Take 1 tablet (300 mg total) by mouth daily. 09/06/20   Charlynne Cousins, MD  Nutritional Supplements (FEEDING SUPPLEMENT, OSMOLITE 1.5 CAL,) LIQD Place 1,000 mLs into feeding tube continuous. 09/06/20   Charlynne Cousins, MD  Nutritional Supplements (FEEDING SUPPLEMENT, PROSOURCE TF,) liquid Place 45 mLs into feeding tube 3 (three) times daily. 09/06/20   Charlynne Cousins, MD  Water For Irrigation, Sterile (FREE WATER) SOLN Place 200 mLs into feeding tube every 4 (four) hours. 09/06/20   Charlynne Cousins, MD     Vital Signs: BP (!) 121/57 (BP Location: Left Arm)   Pulse 86   Temp 98.3 F (36.8 C) (Oral)   Resp 16   Ht '5\' 10"'$  (1.778 m)   Wt 165 lb 5.5 oz (75 kg)   SpO2 100%   BMI 23.72 kg/m   Physical Exam Constitutional:      Appearance: He is ill-appearing.  Eyes:     General: Scleral icterus present.  Cardiovascular:     Rate and Rhythm: Normal rate.  Pulmonary:     Effort: Pulmonary effort is normal.  Abdominal:     Comments: Biliary drain site/dressing C/D/I, no  leaking, erythema or s/sx of infection.Scant amount granular, yellow fluid in bag. RN states she flushed the biliary drain prior to my arrival and flushed easily and emptied ~ 250cc fluid from bag.  G-tube in place. Site/dressing C/D/I  Neurological:     Mental Status: He is alert.    Imaging: No results found.  Labs:  CBC: Recent Labs    09/01/20 0450 09/02/20 0508 09/03/20 0056 09/05/20 0842  WBC 14.2* 16.1* 14.2* 9.3  HGB 9.3* 9.2* 9.2* 9.8*  HCT 27.5* 26.5* 27.0* 29.4*  PLT 314 249 305 359    COAGS: Recent Labs    08/15/20 0452 08/16/20 0553 08/27/20 1015 09/02/20 0345  INR 1.5* 1.1 1.2 1.4*     BMP: Recent Labs    09/01/20 0450 09/02/20 0345 09/03/20 0056 09/05/20 0842  NA 140 138 137 144  K 3.7 3.6 3.3* 4.5  CL 113* 111 109 116*  CO2 20* 20* 19* 17*  GLUCOSE 178* 77 193* 210*  BUN '16 17 18 '$ 24*  CALCIUM 8.7* 8.8* 8.6* 8.7*  CREATININE 0.65 0.51* 0.64 0.71  GFRNONAA >60 >60 >60 >60    LIVER FUNCTION TESTS: Recent Labs    09/01/20 0450 09/02/20 0345 09/03/20 0056 09/05/20 0842  BILITOT 8.9* 11.0* 10.6* 7.7*  AST 123* 130* 118* 125*  ALT 116* 115* 99* 100*  ALKPHOS 154* 155* 169* 168*  PROT 5.9* 6.3* 5.9* 6.0*  ALBUMIN 2.1* 2.4* 1.9* 2.2*    Assessment and Plan:  -Biliary drain site/dressing C/D/I, no leaking, erythema or s/sx of infection. Scant amt granular, yellow fluid in bag. RN states she flushed drain this am with no resistance and emptied ~250 cc fluid from bag.  -G-tube in place. Site/dressing C/D/I -Per Dr. Olevia Bowens, pt d/c home today w/ OP palliative care to follow d/t poor prognosis -Pt to follow up w/ IR for management of biliary drain  Electronically Signed: Tyson Alias, NP 09/06/2020, 12:01 PM   I spent a total of 20 minutes at the the patient's bedside AND on the patient's hospital floor or unit, greater than 50% of which was counseling/coordinating care for biliary drain and G tube assessment.

## 2020-09-06 NOTE — TOC Transition Note (Signed)
Transition of Care [facility]) - Progression Note    Patient Details  Name: Jeff Jordan MRN: [id number] Date of Birth: 1955/11/19  [facility] of [facility] CM/SW Contact  Jessieca Rhem, Marjie Skiff, RN Phone Number: 09/06/2020, 10:25 AM  Clinical Narrative:    Pt to dc home today. Home health orders faxed to [facility] already. Will fax DC summary to them as well. [facility] to provide tube feeds at home and daughter has been taught on tube feedings. IV abx no longer needed at home. [place] liaison aware.   Expected Discharge Plan: [place] Barriers to Discharge: Continued Medical Work up  Expected Discharge Plan and Services Expected Discharge Plan: [place]   Discharge Planning Services: CM Consult Post Acute Care Choice: [place] arrangements for the past 2 months: Single Family Home Expected Discharge Date: 09/06/20               DME Arranged: Gilford Rile rolling DME Agency: [employer] Date DME Agency Contacted: 08/20/20 Time DME Agency Contacted: 1501 Representative spoke with at DME Agency: Freda Munro HH Arranged: OT, PT, RN [facility] Agency: [place] Date [place]: 08/20/20 Time [place]: 1501 Representative spoke with at [place]: [place] Determinants of Health (Force) Interventions    Readmission Risk Interventions No flowsheet data found.

## 2020-09-06 NOTE — Progress Notes (Signed)
Occupational Therapy Progress Note  Patient stating he is going home today. Agreeable to washing up during OT session. Overall supervision level for sit to stand to complete lower body bathing of perianal area, able to maintain balance with unilateral support on walker. Patient does appear fatigued with two sit to stands during bathing and transferring to recliner chair. When asked patient states his daughter is picking him up and will be helping him at home.     09/06/20 1100  OT Visit Information  Last OT Received On 09/06/20  Assistance Needed +1  History of Present Illness 65 year old M with PMH of pancreatic adenocarcinoma, DM-2, HTN and FTT presenting for biliary obstruction due to pancreatic adenocarcinoma after he failed biliary stent placement by ERCP at GI office on 7/19.  He had percutaneous drain placed on 7/21 that was converted to internal and external biliary drain on 7/22.  He underwent cholangiogram on 7/28.  Biliary fluid with Candida glabrata and staph epidermis.  Started on Diflucan and ceftriaxone on 7/29 which were since changed to Eraxis and IV Unasyn per ID recommendation.  LFT and bilirubin improving.  G-tube placed by IR on 8/1.  Tube feeds restarted.  Precautions  Precautions Fall  Precaution Comments bilary drain on R, G tube, abdominal binder  Pain Assessment  Pain Assessment Faces  Faces Pain Scale 4  Pain Location abdomen "Right side"  Pain Descriptors / Indicators Aching;Discomfort  Pain Intervention(s) Patient requesting pain meds-RN notified  Cognition  Arousal/Alertness Awake/alert  Behavior During Therapy WFL for tasks assessed/performed  Overall Cognitive Status Within Functional Limits for tasks assessed  ADL  Overall ADL's  Needs assistance/impaired  Grooming Wash/dry face;Set up;Sitting  Upper Body Bathing Set up;Sitting  Lower Body Bathing Supervison/ safety;Sit to/from stand  Lower Body Bathing Details (indicate cue type and reason) patient able  to maintain standing balance with unilateral support of walker while standing at edge of bed to complete lower body bathing including perianal area. does appear fatigued with ADL task  Toilet Transfer Supervision/safety;Stand-pivot;Cueing for safety;RW  Toilet Transfer Details (indicate cue type and reason) initial min cue for hand placement to push from bed, able to carry over for second sit to stand with walker. does not require physical assistance however increased time to power up to standing  Functional mobility during ADLs Supervision/safety;Rolling walker  General ADL Comments patient does not need physical assistance for self care tasks however does appear fatigued even with completing ~75% of bathing task in sitting at edge of bed. patient states his daughter will be helping him at discharge.  Bed Mobility  Overal bed mobility Needs Assistance  Bed Mobility Rolling;Sidelying to Sit  Rolling Supervision  Sidelying to sit Min guard;HOB elevated  General bed mobility comments min G for safety  Balance  Overall balance assessment Needs assistance  Sitting-balance support Feet supported  Sitting balance-Leahy Scale Good  Standing balance support During functional activity  Standing balance-Leahy Scale Fair  Transfers  Overall transfer level Needs assistance  Equipment used Rolling walker (2 wheeled)  Transfers Sit to/from Stand;Stand Pivot Transfers  Sit to Stand Supervision  Stand pivot transfers Supervision  General transfer comment please see toilet transfer in ADL section  OT - End of Session  Equipment Utilized During Treatment Rolling walker  Activity Tolerance Patient tolerated treatment well  Patient left in chair;with call bell/phone within reach  Nurse Communication Mobility status;Patient requests pain meds  OT Assessment/Plan  OT Plan Discharge plan remains appropriate  OT Visit Diagnosis Muscle weakness (  generalized) (M62.81);Unsteadiness on feet (R26.81)  OT  Frequency (ACUTE ONLY) Min 2X/week  Follow Up Recommendations Home health OT  OT Equipment None recommended by OT  AM-PAC OT "6 Clicks" Daily Activity Outcome Measure (Version 2)  Help from another person eating meals? 4  Help from another person taking care of personal grooming? 3  Help from another person toileting, which includes using toliet, bedpan, or urinal? 3  Help from another person bathing (including washing, rinsing, drying)? 3  Help from another person to put on and taking off regular upper body clothing? 3  Help from another person to put on and taking off regular lower body clothing? 3  6 Click Score 19  Progressive Mobility  What is the highest level of mobility based on the progressive mobility assessment? Level 4 (Walks with assist in room) - Balance while marching in place and cannot step forward and back - Complete  Mobility Ambulated with assistance in room;Out of bed to chair with meals  OT Goal Progression  Progress towards OT goals Progressing toward goals  Acute Rehab OT Goals  Patient Stated Goal Regain IND  OT Goal Formulation With patient  Time For Goal Achievement 09/18/20  Potential to Achieve Goals Good  ADL Goals  Pt Will Transfer to Toilet ambulating;regular height toilet;with modified independence  Pt Will Perform Toileting - Clothing Manipulation and hygiene with modified independence;sit to/from stand  Additional ADL Goal #1 Patient will stand at sink to perform grooming task as evidence of improving activity tolerance  OT Time Calculation  OT Start Time (ACUTE ONLY) 0901  OT Stop Time (ACUTE ONLY) 0925  OT Time Calculation (min) 24 min  OT General Charges  $OT Visit 1 Visit  OT Treatments  $Self Care/Home Management  23-37 mins   Jeff Jordan OT OT pager: [phone]

## 2020-09-06 NOTE — Discharge Summary (Signed)
Physician Discharge Summary  Jahi Gusman [id number] DOB: 04-Nov-1955 DOA: 08/14/2020  PCP: Georganna Skeans, PA-C  Admit date: 08/14/2020 Discharge date: 09/06/2020  Admitted From: Home Disposition:  Home  Recommendations for Outpatient Follow-up:  Follow up with PCP in 1-2 weeks Please obtain BMP/CBC in one week Palliative care to follow-up as an outpatient due to his poor prognosis.  Home Health:No Equipment/Devices: Pump for tube feedings Discharge Condition:Guarded CODE STATUS:Full Diet recommendation: Heart Healthy   Brief/Interim Summary: 65 y.o. male past medical history of pancreatic adenocarcinoma, diabetes mellitus type 2 essential hypertension presents with obstruction of the biliary tree due to adenocarcinoma after failed biliary stent placement by ERCP on 08/14/2020 and patient was referred for admission for IR consult.  Percutaneous drain placed on 08/16/2020 converted to internal and external biliary drain on 08/17/2020, who underwent cholangiogram on 08/17/2020, with biliary fluid growing Candida and staph epi, started on Diflucan and Rocephin on 08/24/2020, which were transitioned to Eraxis and IV Unasyn for which she completed 5-day treatment on 08/30/2020.  G-tube placed by IR on 08/27/2020.  IR Drain but the patient started to spike fever with a rising leukocytosis and tachycardia, repeated CT scan of the abdomen and pelvis showed a stable pancreatic head mass, decreased biliary distention pneumobilia and possible right colitis blood cultures were sent, ID recommended to restart IV Unasyn on 09/02/2020, biliary drain was on it And drain to gravity, for which he will continue IV Unasyn until 09/06/2020 has remained afebrile since then   Significant Events: 7/19-outpatient ERCP and biliary stent placement unsuccessful due to duodenal stenosis   Significant studies: -7/21-[facility] biliary drain,  -7/22-conversion to internal and external biliary drain  -7/28-IR  cholangiogram -7/27-biliary fluid culture with Candida glabrata and staph epidermis.  Staph epidermis likely contaminant   Antibiotics: 8/4-completed 5 days of Eraxis and IV Unasyn 09/04/2020 started back on IV Unasyn complete course on 09/06/2020   Microbiology data: Blood cultures NGTD.   Procedures: -8/7-CT abdomen and pelvis concerning for new pneumobilia and right-sided colitis 8/7-IR following-uncapped biliary drain.  Now drain to gravity.  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 Due to his fever he was started on IV Unasyn which she will continue as an outpatient for 5 more days he has remained afebrile with no leukocytosis. He will be a good candidate for home hospice.  Diarrhea/abdominal pain: Now resolved likely due to tube feedings, resolved with Lomotil.  Gastric outlet obstruction: G-tube placed by IR on 08/27/2020 tolerating his tube feedings.  Chest pain/abdominal pain: Due to malignancy continue narcotics.  Hypovolemic hypernatremia: Resolved with free water flushes.  Controlled diabetes mellitus type 2 with hyperglycemia: No changes made to his medication continue current home regimen A1c of 5.8.  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  Essential hypertension: Continue Avapro and Coreg discontinue Norvasc.  Electrolyte imbalance: Repleted now resolved.  Nonanemic Metabolic acidosis: Likely due to diarrhea now resolved.  Hyperlipidemia, Discontinue statins.  GERD: Continue tonics.  Oral thrush: He completed a course of Eraxis.  Generalized weakness. Physical therapy evaluated the  patient recommended home health PT.  Goals of care: Poor prognosis follow-up palliative needs to follow-up as an outpatient.  Discharge Instructions  Discharge Instructions     Diet - low sodium heart healthy   Complete by: As directed    Increase activity slowly   Complete by: As directed    No wound care   Complete by: As directed       Allergies as of 09/06/2020   No Known Allergies      Medication List     STOP taking these medications    aspirin 81 MG EC tablet   rosuvastatin 5 MG tablet Commonly known as: CRESTOR       TAKE these medications    amLODipine 5 MG tablet Commonly known as: NORVASC Take 5 mg by mouth daily.   amoxicillin-clavulanate 875-125 MG tablet Commonly known as: AUGMENTIN Take 1 tablet by mouth every 12 (twelve) hours for 5 days.   carvedilol 6.25 MG tablet Commonly known as: COREG Take 1 tablet (6.25 mg total) by mouth 2 (two) times daily.   feeding supplement (PROSource TF) liquid Place 45 mLs into feeding tube 3 (three) times daily.   feeding supplement (OSMOLITE 1.5 CAL) Liqd Place 1,000 mLs into feeding tube continuous.   ferrous sulfate 325 (65 FE) MG tablet Take 325 mg by mouth daily with breakfast.   free water Soln Place 200 mLs into feeding tube every 4 (four) hours.   insulin aspart 100 UNIT/ML FlexPen Commonly known as: NOVOLOG Inject 5 Units into the skin 3 (three) times daily.   insulin detemir 100 UNIT/ML FlexPen Commonly known as: LEVEMIR Inject 40 Units into the skin daily.   Insulin Pen Needle 31G X 6 MM Misc 1 Device by Does not apply route 2 (two) times daily.   irbesartan 300 MG tablet Commonly known as: AVAPRO Take 1 tablet (300 mg total) by mouth daily.   Jardiance 25 MG Tabs tablet Generic drug: empagliflozin Take 25 mg by mouth daily.   loperamide 2 MG capsule Commonly known as: IMODIUM Take by mouth as needed for diarrhea or loose stools.   metFORMIN 1000 MG tablet Commonly known  as: GLUCOPHAGE Take 1,000 mg by mouth 2 (two) times daily.   olmesartan 40 MG tablet Commonly known as: BENICAR Take 40 mg by mouth daily.   omeprazole 40 MG capsule Commonly known as: PRILOSEC Take 40 mg by mouth every morning.   ondansetron 4 MG tablet Commonly known as: ZOFRAN Take 1 tablet (4 mg total) by mouth every 4 (four) hours as needed for nausea.   pioglitazone 45 MG tablet Commonly known as: ACTOS Take 45 mg by mouth daily.       ASK your doctor about these medications    Oxycodone HCl 10 MG Tabs Take 1 tablet (10 mg total) by mouth every 4 (four) hours as needed.               Durable Medical Equipment  (From admission, onward)           Start     Ordered   08/30/20 2006  For home use only DME 3 n 1  Once        08/30/20 2006   08/30/20 2006  For home use only DME Tube feeding  Once       Comments: -Isosource 1.5 @ 60 ml/hr via G-tube (6 cartons total) -Provides 2250 kcals, 102g protein and 1146 ml H2O   08/30/20 2006   08/30/20 2006  For home use only DME Tube feeding pump  Once       Question:  Length of Need  Answer:  12 Months   08/30/20 2006   08/30/20 2006  For home use only DME Walker rolling  Once       Question Answer Comment  Walker: With [place]   Patient needs a walker to treat with the following condition Weakness      08/30/20 2006            [facility] Follow up.   Specialty: Home Health Services Contact information: [address] [place] [address] [phone]         Georganna Skeans, PA-C. Schedule an appointment as soon as possible for a visit in 1 week(s).   Specialty: Family Medicine Contact information: [address] [place] [address] [phone]                No Known Allergies  Consultations: ID GI Interventional radiology   Procedures/Studies: DG Abd 1 View  Result Date: 08/22/2020 CLINICAL DATA:  Confirm NG tube placement  EXAM: ABDOMEN - 1 VIEW COMPARISON:  None. FINDINGS: There is a percutaneous biliary stent in place. There is a weighted tip enteric tube coiled with tip overlying the stomach the lung bases are clear. Nonobstructive upper abdominal bowel gas pattern. IMPRESSION: Weighted tip enteric tube is coiled in the left upper quadrant overlying the stomach. Electronically Signed   By: Maurine Simmering   On: 08/22/2020 20:16   DG Abd 1 View  Result Date: 08/22/2020 CLINICAL DATA:  Pt has biliary obstruction from pancreatic cancer, nausea, vomiting. EXAM: ABDOMEN - 1 VIEW COMPARISON:  08/21/2020 FINDINGS: Biliary tube in unchanged position. The bowel gas pattern is normal. No radio-opaque calculi or other significant radiographic abnormality are seen. IMPRESSION: Biliary tube in unchanged position. Electronically Signed   By: Kathreen Devoid   On: 08/22/2020 14:49   DG Abd 1 View  Result Date: 08/21/2020 CLINICAL DATA:  Few feeding tube placement EXAM: ABDOMEN - 1 VIEW COMPARISON:  None. FINDINGS: Soft feeding tube enters the stomach, with its tip in the fundus. Drainage catheter present in the right upper abdomen. IMPRESSION: Soft feeding tube tip in the fundus of the stomach. Electronically Signed   By: Nelson Chimes M.D.   On: 08/21/2020 12:18   CT CHEST W CONTRAST  Result Date: 08/16/2020 CLINICAL DATA:  Pancreatic cancer, staging EXAM: CT CHEST WITH CONTRAST TECHNIQUE: Multidetector CT imaging of the chest was performed during intravenous contrast administration. CONTRAST:  85m OMNIPAQUE IOHEXOL 350 MG/ML SOLN COMPARISON:  Partial comparison to CT abdomen dated 07/27/2020 FINDINGS: Cardiovascular: Heart is normal in size.  No pericardial effusion. No evidence of thoracic aortic aneurysm. Atherosclerotic calcifications of the aortic arch. Mediastinum/Nodes: No suspicious mediastinal lymphadenopathy. Visualized thyroid is unremarkable. Lungs/Pleura: Mild bibasilar atelectasis. Mild centrilobular and paraseptal  emphysematous changes, upper lung predominant. No focal consolidation. 5 x 3 mm subpleural nodule in the anterior right upper lobe (series 5/image 3). No pleural effusion or pneumothorax. Upper Abdomen: Moderate intrahepatic and extrahepatic ductal dilatation in this patient with known pancreatic head mass (not visualized). Stable 3.4 cm mass in the pancreatic tail extending to the splenic hilum (series 2/image 115), grossly unchanged. Heterogeneous enhancement of the bilateral kidneys, unchanged. Musculoskeletal: Cervical spine fixation hardware, incompletely visualized. Degenerative changes of the lower thoracic/upper lumbar spine. IMPRESSION: 4 mm (mean diameter) subpleural nodule in the anterior right upper lobe. This is not considered overly suspicious for isolated pulmonary metastasis, but warrants attention on follow-up. No findings specific for metastatic  disease in the chest. Stable abdominal findings related to known pancreatic head malignancy with additional lesion in the pancreatic tail. Aortic Atherosclerosis (ICD10-I70.0) and Emphysema (ICD10-J43.9). Electronically Signed   By: Julian Hy M.D.   On: 08/16/2020 15:20   CT PELVIS W CONTRAST  Result Date: 08/16/2020 CLINICAL DATA:  Pancreatic cancer, staging EXAM: CT PELVIS WITH CONTRAST TECHNIQUE: Multidetector CT imaging of the pelvis was performed using the standard protocol following the bolus administration of intravenous contrast. CONTRAST:  31m OMNIPAQUE IOHEXOL 350 MG/ML SOLN COMPARISON:  Partial comparison to CT abdomen dated 07/27/2020 FINDINGS: Urinary Tract:  Thick-walled bladder. Bowel:  Sigmoid diverticulosis, without evidence of diverticulitis. Vascular/Lymphatic: No evidence of abdominal aortic aneurysm. Atherosclerotic calcifications of the abdominal aorta and branch vessels. No suspicious pelvic lymphadenopathy. Reproductive:  Prostatomegaly, suggesting BPH. Other:  Trace pelvic fluid (series 2/image 30). Musculoskeletal: No  focal osseous lesions. IMPRESSION: No evidence of metastatic disease in the pelvis. 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 Electronically Signed   By: SJulian HyM.D.   On: 08/16/2020 15:21   CT ABDOMEN PELVIS W CONTRAST  Result Date: 09/02/2020 CLINICAL DATA:  Abdominal abscess/infection suspected. Right upper quadrant pain EXAM: CT ABDOMEN AND PELVIS WITH CONTRAST TECHNIQUE: Multidetector CT imaging of the abdomen and pelvis was performed using the standard protocol following bolus administration of intravenous contrast. CONTRAST:  846mOMNIPAQUE IOHEXOL 350 MG/ML SOLN COMPARISON:  07/27/2020 FINDINGS: Lower chest: Small bilateral pleural effusions. Bibasilar dependent atelectasis. Heart is normal size. Hepatobiliary: Percutaneous transpedicle biliary stent is in place terminating in the duodenum. There is pneumobilia. Gas also noted within the gallbladder. No visible suspicious focal hepatic abnormality. Pancreas: Again seen is the infiltrating mass in the region of the pancreatic head, not significantly changed since prior study. Pancreatic ductal dilatation, stable. Spleen: No focal abnormality.  Normal size. Adrenals/Urinary Tract: No adrenal abnormality. No focal renal abnormality. No stones or hydronephrosis. Urinary bladder is unremarkable. Stomach/Bowel: There is sigmoid diverticulosis. No active diverticulitis. Wall thickening noted in the cecum and ascending colon concerning for colitis. No evidence of bowel obstruction. Gastrostomy tube within the stomach. Vascular/Lymphatic: Aortic atherosclerosis. No aneurysm. Prominent upper abdominal lymph nodes. Portacaval lymph node with a short axis diameter of 12 mm again seen, not significantly changed. Mildly prominent central mesenteric lymph nodes are also stable. Reproductive: Prostate enlargement. Other: Moderate free fluid in the pelvis. Musculoskeletal: No acute bony abnormality. IMPRESSION: Stable pancreatic head  mass. Interval percutaneous transhepatic biliary drain placement. Decreasing biliary distension. Pneumobilia now present. Wall thickening noted within the right colon concerning for colitis. Sigmoid diverticulosis.  No active diverticulitis. Gastrostomy tube in the stomach. Small bilateral effusions, bibasilar atelectasis. Aortic atherosclerosis. Electronically Signed   By: KeRolm Baptise.D.   On: 09/02/2020 01:17   IR GASTROSTOMY TUBE MOD SED  Result Date: 08/27/2020 INDICATION: [age]ear old male with metastatic pancreatic cancer with possible duodenal obstruction. EXAM: PERC PLACEMENT GASTROSTOMY MEDICATIONS: Vancomycin 1 IV; Antibiotics were administered within 1 hour of the procedure. ANESTHESIA/SEDATION: The patient was continuously monitored during the procedure by the interventional radiology nurse under my direct supervision. Versed 2 mg IV; dilaudid 1 mg IV Moderate Sedation Time:  24 CONTRAST:  6088mMNIPAQUE IOHEXOL 300 MG/ML SOLN - administered into the gastric lumen. FLUOROSCOPY TIME:  Fluoroscopy Time: 4 minutes 12 seconds (58 mGy). COMPLICATIONS: None immediate. PROCEDURE: Informed written consent was obtained from the patient after a thorough discussion of the procedural risks, benefits and alternatives. All questions were addressed. Maximal Sterile Barrier Technique was utilized  including caps, mask, sterile gowns, sterile gloves, sterile drape, hand hygiene and skin antiseptic. A timeout was performed prior to the initiation of the procedure. The patient was placed on the procedure table in the supine position. Pre-procedure abdominal film confirmed visualization of the transverse colon. The patient was prepped and draped in usual sterile fashion. The stomach was insufflated with air via the indwelling nasogastric tube. Under fluoroscopy, a puncture site was selected and local analgesia achieved with 1% lidocaine infiltrated subcutaneously. Under fluoroscopic guidance, a gastropexy needle was  passed into the stomach and the T-bar suture was released. Entry into the stomach was confirmed with fluoroscopy, aspiration of air, and injection of contrast material. This was repeated with an additional gastropexy suture (for a total of 2 fasteners). At the center of these gastropexy sutures, a dermatotomy was performed. An 18 gauge needle was passed into the stomach at the site of this dermatotomy, and position within the gastric lumen again confirmed under fluoroscopy using aspiration of air and contrast injection. An Amplatz guidewire was passed through this needle and intraluminal placement within the stomach was confirmed by fluoroscopy. The needle was removed. Over the guidewire, the percutaneous tract was dilated using a 10 mm non-compliant balloon. The balloon was deflated, then pushed into the gastric lumen followed in concert by the 20 Fr gastrostomy tube. The retention balloon of the percutaneous gastrostomy tube was inflated with 20 mL of sterile water. The tube was withdrawn until the retention balloon was at the edge of the gastric lumen. The external bumper was brought to the abdominal wall. Contrast was injected through the gastrostomy tube, confirming intraluminal positioning. Attempted was made it jejunal tube placement, however the pylorus was unable be engaged. The patient tolerated the procedure well without any immediate post-procedural complications. IMPRESSION: Technically successful placement of 20 Fr gastrostomy tube. Unsuccessful attempted jejunostomy tube placement due to inability to cannulate the pylorus. PLAN: Attempt at jejunal arm placement at a later date could be pursued upon request as clinically indicated. Ruthann Cancer, MD [facility] Radiology Electronically Signed   By: Ruthann Cancer MD   On: 08/27/2020 16:52   DG Chest Port 1 View  Result Date: 08/29/2020 CLINICAL DATA:  Central chest pain EXAM: PORTABLE CHEST 1 VIEW  COMPARISON:  None. FINDINGS: The heart size and mediastinal contours are within normal limits. Low lung volumes with mild bibasilar atelectasis. Lungs are otherwise clear. No pleural effusion or pneumothorax. Partially visualized catheter tubing overlies the right hemiabdomen. The visualized skeletal structures are unremarkable. IMPRESSION: Low lung volumes with mild bibasilar atelectasis. Electronically Signed   By: Davina Poke D.O.   On: 08/29/2020 12:26   DG ERCP  Result Date: 08/14/2020 CLINICAL DATA:  Pancreatic mass and biliary obstruction. EXAM: ERCP TECHNIQUE: Multiple spot images obtained with the fluoroscopic device and submitted for interpretation post-procedure. COMPARISON:  CT of the abdomen on 07/27/2020 at [place]: Imaging with a C-arm demonstrates attempted cannulation of the ampulla and common bile duct. Cholangiogram was not able to be performed. IMPRESSION: Attempted cannulation of the ampulla and common bile duct. These images were submitted for radiologic interpretation only. Please see the procedural report for the amount of contrast and the fluoroscopy time utilized. Electronically Signed   By: Aletta Edouard M.D.   On: 08/14/2020 15:00   DG ABD ACUTE 2+V W 1V CHEST  Result Date: 09/01/2020 CLINICAL DATA:  Fever EXAM: DG ABDOMEN ACUTE WITH 1 VIEW CHEST COMPARISON:  08/22/2020 FINDINGS: Percutaneous biliary  stent noted in the right abdomen. Nonobstructive bowel gas pattern. No free air or suspicious calcification. Visualized lung bases clear. IMPRESSION: No acute findings. Electronically Signed   By: Rolm Baptise M.D.   On: 09/01/2020 21:59   IR CHOLANGIOGRAM EXISTING TUBE  Result Date: 08/23/2020 INDICATION: 65 year old gentleman with history of metastatic pancreatic cancer status post external biliary drain placement on 08/16/2020 followed by conversion to internal external biliary drain on 08/17/2020 returns to interventional radiology given rising bilirubin.  EXAM: Fluoroscopic biliary drain check MEDICATIONS: None ANESTHESIA/SEDATION: None FLUOROSCOPY TIME:  Fluoroscopy Time: 0 minutes 30 seconds (4 mGy). COMPLICATIONS: None immediate. PROCEDURE: Informed written consent was obtained from the patient after a thorough discussion of the procedural risks, benefits and alternatives. Patient positioned supine on the procedure table. Scout image demonstrated the internal external biliary drain in appropriate unchanged position. Contrast administered through the drain confirmed patency. Drain flushed and reattached to bag. IMPRESSION: Fluoroscopically cholangiogram confirmed appropriate positioning and function of internal external biliary drain. Electronically Signed   By: Miachel Roux M.D.   On: 08/23/2020 16:21   IR BILIARY DRAIN PLACEMENT WITH CHOLANGIOGRAM  Result Date: 08/16/2020 INDICATION: Concern for metastatic pancreatic cancer, now with obstructive jaundice post failed attempt at internal biliary stent placement. Patient presents today for biliary drain placement. EXAM: ULTRASOUND AND FLUOROSCOPIC GUIDED PERCUTANEOUS TRANSHEPATIC CHOLANGIOGRAM AND BILIARY TUBE PLACEMENT COMPARISON:  CT abdomen pelvis-07/27/2020; chest CT-08/16/2020; ERCP-7/19/2 MEDICATIONS: Patient is currently admitted to hospital receiving intravenous antibiotics; The antibiotic was administered with an appropriate time frame prior to the initiation of the procedure CONTRAST:  60m OMNIPAQUE IOHEXOL 300 MG/ML SOLN - administered into the biliary tree. ANESTHESIA/SEDATION: Moderate (conscious) sedation was employed during this procedure. A total of Versed 2 mg and Fentanyl 200 mcg was administered intravenously. Moderate Sedation Time: 29 minutes. The patient's level of consciousness and vital signs were monitored continuously by radiology nursing throughout the procedure under my direct supervision. FLUOROSCOPY TIME:  4 minutes, 18 seconds (10000000mGy) COMPLICATIONS: None immediate. TECHNIQUE:  Informed written consent was obtained from the patient after a discussion of the risks, benefits and alternatives to treatment. Questions regarding the procedure were encouraged and answered. A timeout was performed prior to the initiation of the procedure. The right upper abdominal quadrant was prepped and draped in the usual sterile fashion, and a sterile drape was applied covering the operative field. Maximum barrier sterile technique with sterile gowns and gloves were used for the procedure. A timeout was performed prior to the initiation of the procedure. Ultrasound scanning of the right upper abdominal quadrant was performed to delineate the anatomy and avoid transgression of the gallbladder or the pleural. A spot along the right mid axillary line was marked fluoroscopically inferior to the right costophrenic angle. After the overlying soft tissues were anesthetized with 1% Lidocaine with epinephrine, under direct ultrasound guidance, a 22 gauge needle was utilized to cannulate the peripheral aspect of a right intrahepatic biliary duct. Appropriate position was confirmed with limited contrast injection. Next, the duct was cannulated with a Nitrex wire and dilated with an Accustick set under fluoroscopic guidance. Limited cholangiograms were performed in various obliquities confirming appropriate access. Attempts were made with a 4 French angled glide catheter to advance both a Glidewire beyond the obstruction of the distal aspect of the CBD, however this ultimately proved unsuccessful in part due to the patient's procedure related discomfort. As such, over a short Amplatz wire, the track was dilated ultimately allowing placement of a 10 [place] all-purpose drainage  catheter with end coiled locked within the dilated CBD. Postprocedural spot fluoroscopic images were obtained. Dressings were applied. The patient tolerated the procedure well without immediate postprocedural complication though was  experiencing expected postprocedural discomfort. FINDINGS: Sonographic evaluation of the liver demonstrates moderate intrahepatic biliary ductal dilatation as was demonstrated on preceding abdominal CT. Under direct ultrasound guidance, a dilated peripheral duct within the anterior segment of the right lobe of the liver was accessed allowing placement of a 10 French standard all-purpose drainage catheter with end coiled and locked within the dilated duodenum. Contrast injection demonstrates complete occlusion of the distal aspect of the CBD. There is minimal opacification of the cystic duct and gallbladder without definitive opacification of the left intrahepatic biliary tree. Limited attempts at traversing the distal obstruction of the CBD proved unsuccessful in part due to the patient's discomfort following drainage catheter placement. IMPRESSION: Successful placement of a 10.2 [place] all-purpose drainage catheter with end coiled and locked within the dilated CBD. PLAN: - Maintain external biliary drainage catheter to gravity bag. - Obtain daily CMPs - Once patient's bilirubin has significantly normalized (likely early next week), the patient may return for definitive cholangiogram and attempt at internalization of the drainage catheter. Note, this procedure may be performed at an outpatient basis though does require conscious sedation. Electronically Signed   By: Sandi Mariscal M.D.   On: 08/16/2020 17:10   IR CONVERT BILIARY DRAIN TO INT EXT BILIARY DRAIN  Result Date: 08/17/2020 INDICATION: 65 year old male with pancreatic cancer and malignant obstructed jaundice. He underwent placement of an external biliary drainage catheter yesterday. Unfortunately, his bilirubin continues to increase. Therefore, he presents for biliary drain evaluation and attempted internalization. EXAM: IR CONVERT BILIARY DRAIN TO INT-EXT BILARY DRAIN MEDICATIONS: None. ANESTHESIA/SEDATION: Moderate (conscious) sedation was employed  during this procedure. A total of Versed 4 mg and Fentanyl 100 mcg and 1 mg Dilaudid was administered intravenously. Moderate Sedation Time: 19 minutes. The patient's level of consciousness and vital signs were monitored continuously by radiology nursing throughout the procedure under my direct supervision. FLUOROSCOPY TIME:  Fluoroscopy Time: 3 minutes 42 seconds (57 mGy). COMPLICATIONS: None immediate. PROCEDURE: Informed written consent was obtained from the patient after a thorough discussion of the procedural risks, benefits and alternatives. All questions were addressed. Maximal Sterile Barrier Technique was utilized including caps, mask, sterile gowns, sterile gloves, sterile drape, hand hygiene and skin antiseptic. A timeout was performed prior to the initiation of the procedure. Initial contrast injection through the existing external 10 [place] biliary drainage catheter demonstrates persistent biliary ductal dilatation. Additionally, there are diffuse filling defects within the bile duct consistent with thrombus. The retention suture was cut. The existing tube was transected and removed over a road runner wire. Local anesthesia was attained by infiltration with 1% lidocaine. A 9 French sheath was shaped into a curve and advanced over the wire into the biliary tree. A 5 French angled catheter was then advanced over the wire into the distal common bile duct. Gentle probing with the hydrophilic road runner wire was performed in till the channel through the obstructed distal common bile duct was identified. The wire and catheter combo was advanced through the obstruction and into the duodenum. Contrast injection through the 5 French catheter confirms its location within the horizontal duodenum. A superstiff Amplatz wire was then advanced into the duodenum. The 5 French catheter and 9 French sheath were removed. The transhepatic tract was dilated to 12 [place]. A 12 [place] biliary drain was then advanced over the  wire and formed. The locking loop is within the duodenum. The side hole spanned the common bile duct and proximal right intrahepatic ducts. The catheter was gently flushed and then secured to the skin with 0 Prolene suture. The catheter was connected to gravity bag drainage. IMPRESSION: 1. Hemobilia with thrombus in the dilated bile ducts. This is likely the primary reason for relatively poor drainage through the prior 10 [place] biliary drainage catheter. 2. Successful internalization and placement of a larger 12 French internal/external biliary drainage catheter. PLAN: 1. Maintain drain to gravity bag drainage. Once bilirubin has significantly improved, the drain may be capped. 2. Flush drainage catheter at least once per shift to help clear thrombus from the biliary system. 3. As thrombus is cleared over the coming days, expect significantly increased biliary output and improvement in serum bilirubin. Signed, Criselda Peaches, MD, [facility] Electronically Signed   By: Jacqulynn Cadet M.D.   On: 08/17/2020 17:11   (Echo, Carotid, EGD, Colonoscopy, ERCP)    Subjective: No complaints  Discharge Exam: Vitals:   09/05/20 2007 09/06/20 0415  BP: 116/62 (!) 121/57  Pulse: 91 86  Resp: 20 16  Temp: 99 F (37.2 C) 98.3 F (36.8 C)  SpO2: 100% 100%   Vitals:   09/05/20 1645 09/05/20 2007 09/06/20 0415 09/06/20 0422  BP: 126/64 116/62 (!) 121/57   Pulse: 89 91 86   Resp: '17 20 16   '$ Temp: 97.6 F (36.4 C) 99 F (37.2 C) 98.3 F (36.8 C)   TempSrc: Oral Oral Oral   SpO2: 100% 100% 100%   Weight:    75 kg  Height:        General: Pt is alert, awake, not in acute distress Cardiovascular: RRR, S1/S2 +, no rubs, no gallops Respiratory: CTA bilaterally, no wheezing, no rhonchi Abdominal: Soft, NT, ND, bowel sounds + Extremities: no edema, no cyanosis    The results of significant diagnostics from this hospitalization  (including imaging, microbiology, ancillary and laboratory) are listed below for reference.     Microbiology: Recent Results (from the past 240 hour(s))  Culture, blood (routine x 2)     Status: None (Preliminary result)   Collection Time: 09/01/20 10:47 PM   Specimen: BLOOD  Result Value Ref Range Status   Specimen Description   Final    BLOOD BLOOD LEFT HAND Performed at [facility], [place] [address] [place], [place] [address]    Special Requests   Final    BOTTLES DRAWN AEROBIC AND ANAEROBIC Blood Culture results may not be optimal due to an inadequate volume of blood received in culture bottles Performed at [facility] [address] [place], [place] [address]    Culture   Final    NO GROWTH 3 DAYS Performed at [facility], [place] [address], [place], [place] [address]    Report Status PENDING  Incomplete  Culture, blood (routine x 2)     Status: None (Preliminary result)   Collection Time: 09/01/20 10:47 PM   Specimen: BLOOD  Result Value Ref Range Status   Specimen Description   Final    BLOOD BLOOD LEFT HAND Performed at [facility] [address] [place], [place] [address]    Special Requests   Final    BOTTLES DRAWN AEROBIC ONLY Blood Culture results may not be optimal due to an inadequate volume of blood received in culture bottles Performed at [facility], [place] [address], [place], [place] [address]    Culture   Final    NO GROWTH 3 DAYS Performed at [facility], [place] [address], [place], [place] [address]    Report Status PENDING  Incomplete     Labs: BNP (last 3 results) Recent Labs    08/29/20 0947  BNP [id number]   Basic Metabolic Panel: Recent Labs  Lab 08/31/20 0504 09/01/20 0450 09/02/20 0345 09/03/20 0056 09/05/20 0842  NA 147* 140 138 137 144  K 3.8 3.7 3.6 3.3* 4.5  CL 120* 113* 111 109 116*  CO2 20* 20* 20* 19* 17*  GLUCOSE 207* 178* 77 193* 210*   BUN '13 16 17 18 '$ 24*  CREATININE 0.60* 0.65 0.51* 0.64 0.71  CALCIUM 8.9 8.7* 8.8* 8.6* 8.7*  MG 2.2 2.1 2.3 2.1 2.2  PHOS 2.7 2.8 3.1 2.3* 3.6   Liver Function Tests: Recent Labs  Lab 08/31/20 0504 09/01/20 0450 09/02/20 0345 09/03/20 0056 09/05/20 0842  AST 128* 123* 130* 118* 125*  ALT 122* 116* 115* 99* 100*  ALKPHOS 151* 154* 155* 169* 168*  BILITOT 9.4* 8.9* 11.0* 10.6* 7.7*  PROT 6.0* 5.9* 6.3* 5.9* 6.0*  ALBUMIN 2.3* 2.1* 2.4* 1.9* 2.2*   Recent Labs  Lab 09/03/20 0056  LIPASE 55*   Recent Labs  Lab 09/02/20 0345  AMMONIA 42*   CBC: Recent Labs  Lab 08/31/20 0504 09/01/20 0450 09/02/20 0508 09/03/20 0056 09/05/20 0842  WBC 13.7* 14.2* 16.1* 14.2* 9.3  NEUTROABS  --   --  12.6*  --  6.6  HGB 9.7* 9.3* 9.2* 9.2* 9.8*  HCT 29.3* 27.5* 26.5* 27.0* 29.4*  MCV 94.5 93.9 92.3 92.8 94.5  PLT 311 314 249 305 359   Cardiac Enzymes: No results for input(s): CKTOTAL, CKMB, CKMBINDEX, TROPONINI in the last 168 hours. BNP: Invalid input(s): POCBNP CBG: Recent Labs  Lab 09/05/20 2004 09/05/20 2354 09/06/20 0410 09/06/20 0752 09/06/20 0835  GLUCAP 95 92 102* 59* 129*   D-Dimer No results for input(s): DDIMER in the last 72 hours. Hgb A1c No results for input(s): HGBA1C in the last 72 hours. Lipid Profile No results for input(s): CHOL, HDL, LDLCALC, TRIG, CHOLHDL, LDLDIRECT in the last 72 hours. Thyroid function studies No results for input(s): TSH, T4TOTAL, T3FREE, THYROIDAB in the last 72 hours.  Invalid input(s): FREET3 Anemia work up No results for input(s): VITAMINB12, FOLATE, FERRITIN, TIBC, IRON, RETICCTPCT in the last 72 hours. Urinalysis No results found for: COLORURINE, APPEARANCEUR, [place], [place], [place], [place], [place], [place], PROTEINUR, UROBILINOGEN, NITRITE, LEUKOCYTESUR Sepsis Labs Invalid input(s): PROCALCITONIN,  WBC,  LACTICIDVEN Microbiology Recent Results (from the past 240 hour(s))  Culture, blood (routine x 2)      Status: None (Preliminary result)   Collection Time: 09/01/20 10:47 PM   Specimen: BLOOD  Result Value Ref Range Status   Specimen Description   Final    BLOOD BLOOD LEFT HAND Performed at [facility] [address], [place], [place] [address]    Special Requests   Final    BOTTLES DRAWN AEROBIC AND ANAEROBIC Blood Culture results may not be optimal due to an inadequate volume of blood received in culture bottles Performed at [facility] [address], [place], [place] [address]    Culture   Final    NO GROWTH 3 DAYS Performed at [facility], [place] [address], [place],  [address]    Report Status PENDING  Incomplete  Culture, blood (routine x 2)     Status: None (Preliminary result)   Collection Time: 09/01/20 10:47 PM   Specimen: BLOOD  Result Value Ref Range Status   Specimen Description   Final    BLOOD BLOOD LEFT HAND Performed at [facility] [address] [place], [place] [address]    Special Requests   Final    BOTTLES DRAWN AEROBIC ONLY Blood Culture results may not be optimal due to an inadequate volume of blood received in culture bottles Performed at [facility] [address] [place], [place] [address]    Culture   Final    NO GROWTH 3 DAYS Performed at [facility], [place] [address] [place], [place] [address]    Report Status PENDING  Incomplete     Time coordinating discharge: Over 30 minutes  SIGNED:   Charlynne Cousins, MD  [facility] 09/06/2020, 8:39 AM Pager   If 7PM-7AM, please contact night-coverage [email or web address] Password [email or web address]

## 2020-09-07 LAB — CULTURE, BLOOD (ROUTINE X 2)
Culture: NO GROWTH
Culture: NO GROWTH

## 2020-09-10 ENCOUNTER — Telehealth: Payer: Self-pay | Admitting: Oncology

## 2020-09-10 ENCOUNTER — Other Ambulatory Visit: Payer: Self-pay | Admitting: Oncology

## 2020-09-10 DIAGNOSIS — C259 Malignant neoplasm of pancreas, unspecified: Secondary | ICD-10-CM

## 2020-09-10 NOTE — Telephone Encounter (Signed)
Patient rescheduled from 8/22 to 8/16 Labs 11:30 am - Consult 12:00 noon

## 2020-09-10 NOTE — Progress Notes (Signed)
[facility]  [address] [place],  [place]  [address] [phone]  Clinic Day:  09/11/2020  Referring physician: Georganna Skeans, PA-C  This document serves as a record of services personally performed by Hosie Poisson, MD. It was created on their behalf by Curry,Lauren E, a trained medical scribe. The creation of this record is based on the scribe's personal observations and the provider's statements to them.  CHIEF COMPLAINT:  CC: Newly diagnosed pancreatic cancer  Current Treatment:  Palliative care and Hospice referral   HISTORY OF PRESENT ILLNESS:  Jeff Jordan is a 65 y.o. male referred by Barnie Del, [facility] for the evaluation and treatment of pancreatic cancer. His history dates back several months to when he started having low back pain that moved to his abdomen. He then began to have abdominal distention and saw his PCP where he was found to have elevated liver enzymes. At that time, he also began experiencing increased itching. Ultrasound of the abdomen revealed intrahepatic and extrahepatic biliary dilation including a distended gallbladder. He underwent MRCP on June 27th. Findings from this revealed: Diffuse intra and extrahepatic biliary ductal dilatation, due to distal common bile duct stricture in the region of the pancreatic head. Ill-defined soft tissue prominence in the pancreatic head and uncinate process is poorly characterized without IV contrast, but is suspicious for pancreatic carcinoma with focal pancreatitis considered less likely.  Mild perihepatic ascites and diffuse mesenteric edema. CT imaging of abdomen was obtained on July 1st and revealed an infiltrative mass with epicenter in the head of pancreas is identified compatible with pancreatic adenocarcinoma. There is loss of fat plane between this mass and the descending and transverse portions of the duodenum. Cannot exclude duodenal invasion. Within the tail of pancreas there is  hypoenhancement and masslike enlargement. This may represent a second tumor, focus of metastasis, or focal pancreatitis due to main duct obstruction. 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 There is also complete encasement of the superior mesenteric artery. Findings are compatible with vascular involvement. No signs of liver involvement.  Prominent upper abdominal lymph nodes are identified none of which meet CT criteria for adenopathy. Nonetheless these are somewhat suspicious in the current context and given their multiplicity. There is a complex solid and cystic lesion arising off the inferior pole of the right kidney which is indeterminate. Cannot rule out small renal cell carcinoma.  Small volume of free fluid noted along the right pericolic gutter and mild thickening along the peritoneal reflection of the left pericolic gutter. No convincing evidence for peritoneal nodularity at this time.  On July 19th he underwent ERCP and cytology from that procedure confirmed malignant cells consistent with adenocarcinoma. CA 19-9 from July 20th was significantly elevated at 1,046.  Percutaneous drain placed and converted to internal and external biliary drain on July 22nd.  Serum ammonia at time of discharge on August 7th was 42.  INTERVAL HISTORY: Jeff Jordan presents to the clinic in a wheelchair today and is very ill appearing.  He has been hospitalized for over 3 weeks at [facility] from July 21st to August 11th.  He had multiple complications including cholangitis with candida, acute colitis, and malnutrition.  He has a biliary drain in place as well as a feeding tube.  He notes right upper quadrant pain, especially at night.  He has been using oxycodone 10 mg as needed every 4 hours.  He had additional complications of anemia,  hypernatremia, and oral candidiasis.  His bilirubin went as high as 11.0 and was 7.7 at time of discharge.  His  hemoglobin has been stable at 9.8.  We were unable to draw additional labs today other than serum ammonia.  He is able to walk around some at home.  He notes orange colored urine.  The family states that he will occasionally become confused and will try and rip out his drain and feeding tube.  Serum ammonia today is 11.0.  His  appetite is poor, and he has lost weight.  He continues tube feedings every 4 hours.  He denies fever, chills or other signs of infection.  He denies nausea, vomiting or bowel issues.  He denies sore throat, cough, dyspnea, or chest pain.  REVIEW OF SYSTEMS:  Review of Systems  Constitutional:  Positive for fatigue. Negative for appetite change, chills, fever and unexpected weight change.  HENT:  Negative.    Eyes: Negative.   Respiratory: Negative.  Negative for chest tightness, cough, hemoptysis, shortness of breath and wheezing.   Cardiovascular: Negative.  Negative for chest pain, leg swelling and palpitations.  Gastrointestinal:  Positive for abdominal pain (right upper quadrant). Negative for abdominal distention, blood in stool, constipation, diarrhea, nausea and vomiting.  Endocrine: Negative.   Genitourinary:  Negative for difficulty urinating, dysuria, frequency and hematuria.        Orange colored urine  Musculoskeletal:  Negative for arthralgias, back pain, flank pain, gait problem and myalgias.  Skin: Negative.   Neurological:  Positive for extremity weakness. Negative for dizziness, gait problem, headaches, light-headedness, numbness, seizures and speech difficulty.  Hematological: Negative.   Psychiatric/Behavioral: Negative.  Negative for depression and sleep disturbance. The patient is not nervous/anxious.   All other systems reviewed and are negative.   VITALS:  Blood pressure (!) 127/53, pulse 98, temperature 97.9 F (36.6 C), temperature source Oral, resp. rate 18, weight 131 lb 4.8 oz (59.6 kg), SpO2 99 %.  Wt Readings from Last 3 Encounters:   09/11/20 131 lb 4.8 oz (59.6 kg)  09/06/20 165 lb 5.5 oz (75 kg)  08/02/20 164 lb 11.2 oz (74.7 kg)    Body mass index is 18.84 kg/m.  Performance status (ECOG): 3 - Symptomatic, >50% confined to bed  PHYSICAL EXAM:  Physical Exam Constitutional:      General: He is not in acute distress.    Appearance: He is ill-appearing.  HENT:     Head: Normocephalic and atraumatic.  Eyes:     General: Scleral icterus present.     Extraocular Movements: Extraocular movements intact.     Conjunctiva/sclera: Conjunctivae normal.     Pupils: Pupils are equal, round, and reactive to light.     Comments: He has arcus senilis   Cardiovascular:     Rate and Rhythm: Normal rate and regular rhythm.     Pulses: Normal pulses.     Heart sounds: Normal heart sounds. No murmur heard.   No friction rub. No gallop.  Pulmonary:     Effort: Pulmonary effort is normal. No respiratory distress.     Breath sounds: Normal breath sounds.  Abdominal:     General: Bowel sounds are normal. There is no distension.     Palpations: Abdomen is soft. There is no hepatomegaly, splenomegaly or mass.     Tenderness: There is no abdominal tenderness.     Comments: Mild tenderness in the right upper quadrant and epigastrium.  He has a biliary drain in the right upper  quadrant with a large amount of brown colored bile.  He has a feeding tube in the left upper quadrant and this was flushed today while he was here.   Musculoskeletal:        General: Normal range of motion.     Cervical back: Normal range of motion and neck supple.     Right lower leg: 1+ Edema present.     Left lower leg: 1+ Edema present.  Lymphadenopathy:     Cervical: No cervical adenopathy.  Skin:    General: Skin is warm and dry.  Neurological:     General: No focal deficit present.     Mental Status: He is alert and oriented to person, place, and time. Mental status is at baseline.  Psychiatric:        Mood and Affect: Mood normal.         Behavior: Behavior normal.        Thought Content: Thought content normal.        Judgment: Judgment normal.    LABS:   CBC Latest Ref Rng & Units 09/05/2020 09/03/2020 09/02/2020  WBC 4.0 - 10.5 K/uL 9.3 14.2(H) 16.1(H)  Hemoglobin 13.0 - 17.0 g/dL 9.8(L) 9.2(L) 9.2(L)  Hematocrit 39.0 - 52.0 % 29.4(L) 27.0(L) 26.5(L)  Platelets 150 - 400 K/uL 359 305 249   CMP Latest Ref Rng & Units 09/05/2020 09/03/2020 09/02/2020  Glucose 70 - 99 mg/dL 210(H) 193(H) 77  BUN 8 - 23 mg/dL 24(H) 18 17  Creatinine 0.61 - 1.24 mg/dL 0.71 0.64 0.51(L)  Sodium 135 - 145 mmol/L 144 137 138  Potassium 3.5 - 5.1 mmol/L 4.5 3.3(L) 3.6  Chloride 98 - 111 mmol/L 116(H) 109 111  CO2 22 - 32 mmol/L 17(L) 19(L) 20(L)  Calcium 8.9 - 10.3 mg/dL 8.7(L) 8.6(L) 8.8(L)  Total Protein 6.5 - 8.1 g/dL 6.0(L) 5.9(L) 6.3(L)  Total Bilirubin 0.3 - 1.2 mg/dL 7.7(H) 10.6(H) 11.0(H)  Alkaline Phos 38 - 126 U/L 168(H) 169(H) 155(H)  AST 15 - 41 U/L 125(H) 118(H) 130(H)  ALT 0 - 44 U/L 100(H) 99(H) 115(H)      Lab Results  Component Value Date   [id number] 1,046 (H) 08/15/2020     STUDIES:  DG Abd 1 View  Result Date: 08/22/2020 CLINICAL DATA:  Confirm NG tube placement EXAM: ABDOMEN - 1 VIEW COMPARISON:  None. FINDINGS: There is a percutaneous biliary stent in place. There is a weighted tip enteric tube coiled with tip overlying the stomach the lung bases are clear. Nonobstructive upper abdominal bowel gas pattern. IMPRESSION: Weighted tip enteric tube is coiled in the left upper quadrant overlying the stomach. Electronically Signed   By: Maurine Simmering   On: 08/22/2020 20:16   DG Abd 1 View  Result Date: 08/22/2020 CLINICAL DATA:  Pt has biliary obstruction from pancreatic cancer, nausea, vomiting. EXAM: ABDOMEN - 1 VIEW COMPARISON:  08/21/2020 FINDINGS: Biliary tube in unchanged position. The bowel gas pattern is normal. No radio-opaque calculi or other significant radiographic abnormality are seen. IMPRESSION: Biliary tube in  unchanged position. Electronically Signed   By: Kathreen Devoid   On: 08/22/2020 14:49   DG Abd 1 View  Result Date: 08/21/2020 CLINICAL DATA:  Few feeding tube placement EXAM: ABDOMEN - 1 VIEW COMPARISON:  None. FINDINGS: Soft feeding tube enters the stomach, with its tip in the fundus. Drainage catheter present in the right upper abdomen. IMPRESSION: Soft feeding tube tip in the fundus of the stomach. Electronically Signed   By:  Nelson Chimes M.D.   On: 08/21/2020 12:18   CT CHEST W CONTRAST  Result Date: 08/16/2020 CLINICAL DATA:  Pancreatic cancer, staging EXAM: CT CHEST WITH CONTRAST TECHNIQUE: Multidetector CT imaging of the chest was performed during intravenous contrast administration. CONTRAST:  24m OMNIPAQUE IOHEXOL 350 MG/ML SOLN COMPARISON:  Partial comparison to CT abdomen dated 07/27/2020 FINDINGS: Cardiovascular: Heart is normal in size.  No pericardial effusion. No evidence of thoracic aortic aneurysm. Atherosclerotic calcifications of the aortic arch. Mediastinum/Nodes: No suspicious mediastinal lymphadenopathy. Visualized thyroid is unremarkable. Lungs/Pleura: Mild bibasilar atelectasis. Mild centrilobular and paraseptal emphysematous changes, upper lung predominant. No focal consolidation. 5 x 3 mm subpleural nodule in the anterior right upper lobe (series 5/image 3). No pleural effusion or pneumothorax. Upper Abdomen: Moderate intrahepatic and extrahepatic ductal dilatation in this patient with known pancreatic head mass (not visualized). Stable 3.4 cm mass in the pancreatic tail extending to the splenic hilum (series 2/image 115), grossly unchanged. Heterogeneous enhancement of the bilateral kidneys, unchanged. Musculoskeletal: Cervical spine fixation hardware, incompletely visualized. Degenerative changes of the lower thoracic/upper lumbar spine. IMPRESSION: 4 mm (mean diameter) subpleural nodule in the anterior right upper lobe. This is not considered overly suspicious for isolated  pulmonary metastasis, but warrants attention on follow-up. No findings specific for metastatic disease in the chest. Stable abdominal findings related to known pancreatic head malignancy with additional lesion in the pancreatic tail. Aortic Atherosclerosis (ICD10-I70.0) and Emphysema (ICD10-J43.9). Electronically Signed   By: SJulian HyM.D.   On: 08/16/2020 15:20   CT PELVIS W CONTRAST  Result Date: 08/16/2020 CLINICAL DATA:  Pancreatic cancer, staging EXAM: CT PELVIS WITH CONTRAST TECHNIQUE: Multidetector CT imaging of the pelvis was performed using the standard protocol following the bolus administration of intravenous contrast. CONTRAST:  849mOMNIPAQUE IOHEXOL 350 MG/ML SOLN COMPARISON:  Partial comparison to CT abdomen dated 07/27/2020 FINDINGS: Urinary Tract:  Thick-walled bladder. Bowel:  Sigmoid diverticulosis, without evidence of diverticulitis. Vascular/Lymphatic: No evidence of abdominal aortic aneurysm. Atherosclerotic calcifications of the abdominal aorta and branch vessels. No suspicious pelvic lymphadenopathy. Reproductive:  Prostatomegaly, suggesting BPH. Other:  Trace pelvic fluid (series 2/image 30). Musculoskeletal: No focal osseous lesions. IMPRESSION: No evidence of metastatic disease in the pelvis. 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 Electronically Signed   By: SrJulian Hy.D.   On: 08/16/2020 15:21   CT ABDOMEN PELVIS W CONTRAST  Result Date: 09/02/2020 CLINICAL DATA:  Abdominal abscess/infection suspected. Right upper quadrant pain EXAM: CT ABDOMEN AND PELVIS WITH CONTRAST TECHNIQUE: Multidetector CT imaging of the abdomen and pelvis was performed using the standard protocol following bolus administration of intravenous contrast. CONTRAST:  8041mMNIPAQUE IOHEXOL 350 MG/ML SOLN COMPARISON:  07/27/2020 FINDINGS: Lower chest: Small bilateral pleural effusions. Bibasilar dependent atelectasis. Heart is normal size. Hepatobiliary: Percutaneous  transpedicle biliary stent is in place terminating in the duodenum. There is pneumobilia. Gas also noted within the gallbladder. No visible suspicious focal hepatic abnormality. Pancreas: Again seen is the infiltrating mass in the region of the pancreatic head, not significantly changed since prior study. Pancreatic ductal dilatation, stable. Spleen: No focal abnormality.  Normal size. Adrenals/Urinary Tract: No adrenal abnormality. No focal renal abnormality. No stones or hydronephrosis. Urinary bladder is unremarkable. Stomach/Bowel: There is sigmoid diverticulosis. No active diverticulitis. Wall thickening noted in the cecum and ascending colon concerning for colitis. No evidence of bowel obstruction. Gastrostomy tube within the stomach. Vascular/Lymphatic: Aortic atherosclerosis. No aneurysm. Prominent upper abdominal lymph nodes. Portacaval lymph node with a short axis diameter  of 12 mm again seen, not significantly changed. Mildly prominent central mesenteric lymph nodes are also stable. Reproductive: Prostate enlargement. Other: Moderate free fluid in the pelvis. Musculoskeletal: No acute bony abnormality. IMPRESSION: Stable pancreatic head mass. Interval percutaneous transhepatic biliary drain placement. Decreasing biliary distension. Pneumobilia now present. Wall thickening noted within the right colon concerning for colitis. Sigmoid diverticulosis.  No active diverticulitis. Gastrostomy tube in the stomach. Small bilateral effusions, bibasilar atelectasis. Aortic atherosclerosis. Electronically Signed   By: Rolm Baptise M.D.   On: 09/02/2020 01:17   IR GASTROSTOMY TUBE MOD SED  Result Date: 08/27/2020 INDICATION: 65 year old male with metastatic pancreatic cancer with possible duodenal obstruction. EXAM: PERC PLACEMENT GASTROSTOMY MEDICATIONS: Vancomycin 1 IV; Antibiotics were administered within 1 hour of the procedure. ANESTHESIA/SEDATION: The patient was continuously monitored during the procedure by  the interventional radiology nurse under my direct supervision. Versed 2 mg IV; dilaudid 1 mg IV Moderate Sedation Time:  24 CONTRAST:  34m OMNIPAQUE IOHEXOL 300 MG/ML SOLN - administered into the gastric lumen. FLUOROSCOPY TIME:  Fluoroscopy Time: 4 minutes 12 seconds (58 mGy). COMPLICATIONS: None immediate. PROCEDURE: Informed written consent was obtained from the patient after a thorough discussion of the procedural risks, benefits and alternatives. All questions were addressed. Maximal Sterile Barrier Technique was utilized including caps, mask, sterile gowns, sterile gloves, sterile drape, hand hygiene and skin antiseptic. A timeout was performed prior to the initiation of the procedure. The patient was placed on the procedure table in the supine position. Pre-procedure abdominal film confirmed visualization of the transverse colon. The patient was prepped and draped in usual sterile fashion. The stomach was insufflated with air via the indwelling nasogastric tube. Under fluoroscopy, a puncture site was selected and local analgesia achieved with 1% lidocaine infiltrated subcutaneously. Under fluoroscopic guidance, a gastropexy needle was passed into the stomach and the T-bar suture was released. Entry into the stomach was confirmed with fluoroscopy, aspiration of air, and injection of contrast material. This was repeated with an additional gastropexy suture (for a total of 2 fasteners). At the center of these gastropexy sutures, a dermatotomy was performed. An 18 gauge needle was passed into the stomach at the site of this dermatotomy, and position within the gastric lumen again confirmed under fluoroscopy using aspiration of air and contrast injection. An Amplatz guidewire was passed through this needle and intraluminal placement within the stomach was confirmed by fluoroscopy. The needle was removed. Over the guidewire, the percutaneous tract was dilated using a 10 mm non-compliant balloon. The balloon was  deflated, then pushed into the gastric lumen followed in concert by the 20 Fr gastrostomy tube. The retention balloon of the percutaneous gastrostomy tube was inflated with 20 mL of sterile water. The tube was withdrawn until the retention balloon was at the edge of the gastric lumen. The external bumper was brought to the abdominal wall. Contrast was injected through the gastrostomy tube, confirming intraluminal positioning. Attempted was made it jejunal tube placement, however the pylorus was unable be engaged. The patient tolerated the procedure well without any immediate post-procedural complications. IMPRESSION: Technically successful placement of 20 Fr gastrostomy tube. Unsuccessful attempted jejunostomy tube placement due to inability to cannulate the pylorus. PLAN: Attempt at jejunal arm placement at a later date could be pursued upon request as clinically indicated. DRuthann Cancer MD [facility] Electronically Signed   By: DRuthann CancerMD   On: 08/27/2020 16:52   DG Chest Port 1 View  Result Date: 08/29/2020 CLINICAL  DATA:  Central chest pain EXAM: PORTABLE CHEST 1 VIEW COMPARISON:  None. FINDINGS: The heart size and mediastinal contours are within normal limits. Low lung volumes with mild bibasilar atelectasis. Lungs are otherwise clear. No pleural effusion or pneumothorax. Partially visualized catheter tubing overlies the right hemiabdomen. The visualized skeletal structures are unremarkable. IMPRESSION: Low lung volumes with mild bibasilar atelectasis. Electronically Signed   By: Davina Poke D.O.   On: 08/29/2020 12:26   DG ERCP  Result Date: 08/14/2020 CLINICAL DATA:  Pancreatic mass and biliary obstruction. EXAM: ERCP TECHNIQUE: Multiple spot images obtained with the fluoroscopic device and submitted for interpretation post-procedure. COMPARISON:  CT of the abdomen on 07/27/2020 at [facility]: Imaging with a C-arm  demonstrates attempted cannulation of the ampulla and common bile duct. Cholangiogram was not able to be performed. IMPRESSION: Attempted cannulation of the ampulla and common bile duct. These images were submitted for radiologic interpretation only. Please see the procedural report for the amount of contrast and the fluoroscopy time utilized. Electronically Signed   By: Aletta Edouard M.D.   On: 08/14/2020 15:00   DG ABD ACUTE 2+V W 1V CHEST  Result Date: 09/01/2020 CLINICAL DATA:  Fever EXAM: DG ABDOMEN ACUTE WITH 1 VIEW CHEST COMPARISON:  08/22/2020 FINDINGS: Percutaneous biliary stent noted in the right abdomen. Nonobstructive bowel gas pattern. No free air or suspicious calcification. Visualized lung bases clear. IMPRESSION: No acute findings. Electronically Signed   By: Rolm Baptise M.D.   On: 09/01/2020 21:59   IR CHOLANGIOGRAM EXISTING TUBE  Result Date: 08/23/2020 INDICATION: 65 year old gentleman with history of metastatic pancreatic cancer status post external biliary drain placement on 08/16/2020 followed by conversion to internal external biliary drain on 08/17/2020 returns to interventional radiology given rising bilirubin. EXAM: Fluoroscopic biliary drain check MEDICATIONS: None ANESTHESIA/SEDATION: None FLUOROSCOPY TIME:  Fluoroscopy Time: 0 minutes 30 seconds (4 mGy). COMPLICATIONS: None immediate. PROCEDURE: Informed written consent was obtained from the patient after a thorough discussion of the procedural risks, benefits and alternatives. Patient positioned supine on the procedure table. Scout image demonstrated the internal external biliary drain in appropriate unchanged position. Contrast administered through the drain confirmed patency. Drain flushed and reattached to bag. IMPRESSION: Fluoroscopically cholangiogram confirmed appropriate positioning and function of internal external biliary drain. Electronically Signed   By: Miachel Roux M.D.   On: 08/23/2020 16:21   IR BILIARY DRAIN  PLACEMENT WITH CHOLANGIOGRAM  Result Date: 08/16/2020 INDICATION: Concern for metastatic pancreatic cancer, now with obstructive jaundice post failed attempt at internal biliary stent placement. Patient presents today for biliary drain placement. EXAM: ULTRASOUND AND FLUOROSCOPIC GUIDED PERCUTANEOUS TRANSHEPATIC CHOLANGIOGRAM AND BILIARY TUBE PLACEMENT COMPARISON:  CT abdomen pelvis-07/27/2020; chest CT-08/16/2020; ERCP-7/19/2 MEDICATIONS: Patient is currently admitted to hospital receiving intravenous antibiotics; The antibiotic was administered with an appropriate time frame prior to the initiation of the procedure CONTRAST:  59m OMNIPAQUE IOHEXOL 300 MG/ML SOLN - administered into the biliary tree. ANESTHESIA/SEDATION: Moderate (conscious) sedation was employed during this procedure. A total of Versed 2 mg and Fentanyl 200 mcg was administered intravenously. Moderate Sedation Time: 29 minutes. The patient's level of consciousness and vital signs were monitored continuously by radiology nursing throughout the procedure under my direct supervision. FLUOROSCOPY TIME:  4 minutes, 18 seconds (10000000mGy) COMPLICATIONS: None immediate. TECHNIQUE: Informed written consent was obtained from the patient after a discussion of the risks, benefits and alternatives to treatment. Questions regarding the procedure were encouraged and answered. A timeout was performed prior to the initiation of the  procedure. The right upper abdominal quadrant was prepped and draped in the usual sterile fashion, and a sterile drape was applied covering the operative field. Maximum barrier sterile technique with sterile gowns and gloves were used for the procedure. A timeout was performed prior to the initiation of the procedure. Ultrasound scanning of the right upper abdominal quadrant was performed to delineate the anatomy and avoid transgression of the gallbladder or the pleural. A spot along the right mid axillary line was marked  fluoroscopically inferior to the right costophrenic angle. After the overlying soft tissues were anesthetized with 1% Lidocaine with epinephrine, under direct ultrasound guidance, a 22 gauge needle was utilized to cannulate the peripheral aspect of a right intrahepatic biliary duct. Appropriate position was confirmed with limited contrast injection. Next, the duct was cannulated with a Nitrex wire and dilated with an Accustick set under fluoroscopic guidance. Limited cholangiograms were performed in various obliquities confirming appropriate access. Attempts were made with a 4 French angled glide catheter to advance both a Glidewire beyond the obstruction of the distal aspect of the CBD, however this ultimately proved unsuccessful in part due to the patient's procedure related discomfort. As such, over a short Amplatz wire, the track was dilated ultimately allowing placement of a 10 French standard all-purpose drainage catheter with end coiled locked within the dilated CBD. Postprocedural spot fluoroscopic images were obtained. Dressings were applied. The patient tolerated the procedure well without immediate postprocedural complication though was experiencing expected postprocedural discomfort. FINDINGS: Sonographic evaluation of the liver demonstrates moderate intrahepatic biliary ductal dilatation as was demonstrated on preceding abdominal CT. Under direct ultrasound guidance, a dilated peripheral duct within the anterior segment of the right lobe of the liver was accessed allowing placement of a 10 French standard all-purpose drainage catheter with end coiled and locked within the dilated duodenum. Contrast injection demonstrates complete occlusion of the distal aspect of the CBD. There is minimal opacification of the cystic duct and gallbladder without definitive opacification of the left intrahepatic biliary tree. Limited attempts at traversing the distal obstruction of the CBD proved unsuccessful in part due  to the patient's discomfort following drainage catheter placement. IMPRESSION: Successful placement of a 10.2 [place] all-purpose drainage catheter with end coiled and locked within the dilated CBD. PLAN: - Maintain external biliary drainage catheter to gravity bag. - Obtain daily CMPs - Once patient's bilirubin has significantly normalized (likely early next week), the patient may return for definitive cholangiogram and attempt at internalization of the drainage catheter. Note, this procedure may be performed at an outpatient basis though does require conscious sedation. Electronically Signed   By: Sandi Mariscal M.D.   On: 08/16/2020 17:10   IR CONVERT BILIARY DRAIN TO INT EXT BILIARY DRAIN  Result Date: 08/17/2020 INDICATION: 65 year old male with pancreatic cancer and malignant obstructed jaundice. He underwent placement of an external biliary drainage catheter yesterday. Unfortunately, his bilirubin continues to increase. Therefore, he presents for biliary drain evaluation and attempted internalization. EXAM: IR CONVERT BILIARY DRAIN TO INT-EXT BILARY DRAIN MEDICATIONS: None. ANESTHESIA/SEDATION: Moderate (conscious) sedation was employed during this procedure. A total of Versed 4 mg and Fentanyl 100 mcg and 1 mg Dilaudid was administered intravenously. Moderate Sedation Time: 19 minutes. The patient's level of consciousness and vital signs were monitored continuously by radiology nursing throughout the procedure under my direct supervision. FLUOROSCOPY TIME:  Fluoroscopy Time: 3 minutes 42 seconds (57 mGy). COMPLICATIONS: None immediate. PROCEDURE: Informed written consent was obtained from the patient after a thorough discussion of the procedural  risks, benefits and alternatives. All questions were addressed. Maximal Sterile Barrier Technique was utilized including caps, mask, sterile gowns, sterile gloves, sterile drape, hand hygiene and skin antiseptic. A timeout was performed prior to the initiation of the  procedure. Initial contrast injection through the existing external 10 [place] biliary drainage catheter demonstrates persistent biliary ductal dilatation. Additionally, there are diffuse filling defects within the bile duct consistent with thrombus. The retention suture was cut. The existing tube was transected and removed over a road runner wire. Local anesthesia was attained by infiltration with 1% lidocaine. A 9 French sheath was shaped into a curve and advanced over the wire into the biliary tree. A 5 French angled catheter was then advanced over the wire into the distal common bile duct. Gentle probing with the hydrophilic road runner wire was performed in till the channel through the obstructed distal common bile duct was identified. The wire and catheter combo was advanced through the obstruction and into the duodenum. Contrast injection through the 5 French catheter confirms its location within the horizontal duodenum. A superstiff Amplatz wire was then advanced into the duodenum. The 5 French catheter and 9 French sheath were removed. The transhepatic tract was dilated to 12 [place]. A 12 [place] biliary drain was then advanced over the wire and formed. The locking loop is within the duodenum. The side hole spanned the common bile duct and proximal right intrahepatic ducts. The catheter was gently flushed and then secured to the skin with 0 Prolene suture. The catheter was connected to gravity bag drainage. IMPRESSION: 1. Hemobilia with thrombus in the dilated bile ducts. This is likely the primary reason for relatively poor drainage through the prior 10 [place] biliary drainage catheter. 2. Successful internalization and placement of a larger 12 French internal/external biliary drainage catheter. PLAN: 1. Maintain drain to gravity bag drainage. Once bilirubin has significantly improved, the drain may be capped. 2. Flush drainage catheter at least once per shift to help clear thrombus from the biliary system.  3. As thrombus is cleared over the coming days, expect significantly increased biliary output and improvement in serum bilirubin. Signed, Criselda Peaches, MD, [facility] Radiology Electronically Signed   By: Jacqulynn Cadet M.D.   On: 08/17/2020 17:11     CYTOLOGY - NON PAP  CASE: [id number]  PATIENT: Les Thom  Non-Gynecological Cytology Report   Clinical History: Biliary obstruction; gastritis  Specimen Submitted:  A. PANCREAS, HEAD, FINE NEEDLE ASPIRATION:   FINAL MICROSCOPIC DIAGNOSIS:  - Malignant cells consistent with adenocarcinoma   HISTORY:   Past Medical History:  Diagnosis Date   Biliary obstruction    Essential hypertension    Gastroesophageal reflux    Hepatic steatosis    Mild hyperlipidemia    Type 2 diabetes mellitus ([place])     Family History  Family history unknown: Yes    Social History:  reports that he quit smoking about 20 years ago. His smoking use included cigarettes. He has a 12.00 pack-year smoking history. He has never used smokeless tobacco. He reports that he does not currently use alcohol. He reports that he does not currently use drugs.The patient is accompanied by his daughter and her mother today.  He is single and lives at home alone, but has had his daughter stay with his the past couple of days. He used to drink alcohol, but quit in 2002. He used to smoke around a pack per day, but quit about 12 years ago. He  has one daughter.  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  Allergies: No Known Allergies  Current Medications: Current Outpatient Medications  Medication Sig Dispense Refill   amLODipine (NORVASC) 5 MG tablet Take 5 mg by mouth daily.     amoxicillin-clavulanate (AUGMENTIN) 875-125 MG tablet Take 1 tablet by mouth every 12 (twelve) hours for 5 days. 10 tablet 0   carvedilol (COREG) 6.25 MG tablet Take 1 tablet (6.25 mg total) by mouth 2 (two)  times daily. 30 tablet 3   ferrous sulfate 325 (65 FE) MG tablet Take 325 mg by mouth daily with breakfast.     irbesartan (AVAPRO) 300 MG tablet Take 1 tablet (300 mg total) by mouth daily. 30 tablet 1   JARDIANCE 25 MG TABS tablet Take 25 mg by mouth daily.     loperamide (IMODIUM) 2 MG capsule Take by mouth as needed for diarrhea or loose stools.     metFORMIN (GLUCOPHAGE) 1000 MG tablet Take 1,000 mg by mouth 2 (two) times daily.     Nutritional Supplements (FEEDING SUPPLEMENT, OSMOLITE 1.5 CAL,) LIQD Place 1,000 mLs into feeding tube continuous.  0   olmesartan (BENICAR) 40 MG tablet Take 40 mg by mouth daily.     omeprazole (PRILOSEC) 40 MG capsule Take 40 mg by mouth every morning.     ondansetron (ZOFRAN) 4 MG tablet Take 1 tablet (4 mg total) by mouth every 4 (four) hours as needed for nausea. 90 tablet 3   Oxycodone HCl 10 MG TABS Take 1 tablet (10 mg total) by mouth every 4 (four) hours as needed. (Patient taking differently: Take 10 mg by mouth every 4 (four) hours as needed (pain).) 120 tablet 0   pioglitazone (ACTOS) 45 MG tablet Take 45 mg by mouth daily.     Water For Irrigation, Sterile (FREE WATER) SOLN Place 200 mLs into feeding tube every 4 (four) hours.     No current facility-administered medications for this visit.     ASSESSMENT & PLAN:   Assessment:    At least stage III pancreatic malignancy.  ERCP was pursued in July and cytology confirmed malignancy cells consistent with adenocarcinoma.  CA 19-9 was significantly elevated at 1,046.  Unfortunately, his health has deteriorated quickly over the past few weeks, and at this time, he would not tolerate treatment.  We will plan to pursue palliative care.  Right upper quadrant pain, especially at night.  He has oxycodone 10 mg to use every 4 hours as needed.  With his limited oral intake, he may be eventually a candidate for Fentanyl patch if his pain is not controlled.  Elevated serum ammonia, 42.0 at the time of  discharge.  He underwent biliary stent and feeding tube placement with improvement in his serum ammonia to 11.0 today.  Episodes of confusion.  At times, he will try and rip out his biliary drain and feeding tube.  This is probably related to hepatic failure but I cannot rule out brain metastasis.  Since it is not hepatic encephalopathy, we have no specific treatment for this.  I have recommended to his daughter that he get his affairs in order.   Plan: This is a thin, ill-appearing 65 year old male recently diagnosed with pancreatic cancer in July.  Unfortunately, with his performance status, he would not be able to tolerate any kind of therapy.  Therefore his only option will be palliative and supportive care.  [facility] has been contacted and  I feel that this referral is appropriate.  We discussed DNR status, and he does not wish to be resuscitated in the case of cardiopulmonary arrest, so I filled out a DNR form for him today.  I would be glad to see him back as needed at the clinic, but he will mainly will have symptom management through Hospice.  He and his family understand and agree with this plan of care.  I have answered their questions and they know to call with any concerns.   Thank you for the opportunity to participate in the care of your patients   I provided 50 minutes of face-to-face time during this this encounter and > 50% was spent counseling as documented under my assessment and plan.   I, Rita Ohara, am acting as scribe for Derwood Kaplan, MD  I have reviewed this report as typed by the medical scribe, and it is complete and accurate.

## 2020-09-10 NOTE — Telephone Encounter (Signed)
Patient referred by [facility] for Pancreatic CA - No Showed for Dr Bobby Rumpf due to being Inpatient at [facility] from [facility] called and stated patient needs to be seen ASAP --  Appt made for 09/17/20 Labs 10:00 am - Consult 10:30 am  Claiborne Billings also stated this will be for [facility]

## 2020-09-11 ENCOUNTER — Encounter: Payer: Self-pay | Admitting: Oncology

## 2020-09-11 ENCOUNTER — Other Ambulatory Visit: Payer: Self-pay

## 2020-09-11 ENCOUNTER — Inpatient Hospital Stay: Payer: Medicare HMO | Attending: Hematology and Oncology

## 2020-09-11 ENCOUNTER — Other Ambulatory Visit: Payer: Self-pay | Admitting: Oncology

## 2020-09-11 ENCOUNTER — Inpatient Hospital Stay (INDEPENDENT_AMBULATORY_CARE_PROVIDER_SITE_OTHER): Payer: Medicare HMO | Admitting: Oncology

## 2020-09-11 VITALS — BP 127/53 | HR 98 | Temp 97.9°F | Resp 18 | Wt 131.3 lb

## 2020-09-11 DIAGNOSIS — C259 Malignant neoplasm of pancreas, unspecified: Secondary | ICD-10-CM

## 2020-09-13 ENCOUNTER — Telehealth: Payer: Self-pay

## 2020-09-13 ENCOUNTER — Other Ambulatory Visit: Payer: Self-pay

## 2020-09-13 DIAGNOSIS — R451 Restlessness and agitation: Secondary | ICD-10-CM

## 2020-09-13 DIAGNOSIS — Z515 Encounter for palliative care: Secondary | ICD-10-CM

## 2020-09-13 MED ORDER — HALOPERIDOL 1 MG PO TABS: ORAL_TABLET | ORAL | 0 refills | Status: AC

## 2020-09-13 NOTE — Telephone Encounter (Addendum)
Shelda Pal, Hospice nurse, notified.  ----- Message from Melodye Ped, NP sent at 09/13/2020  4:08 PM EDT ----- Regarding: RE: Hospice nurse call - daily medications I think that is fine.  ----- Message ----- From: Dairl Ponder, RN Sent: 09/13/2020   4:03 PM EDT To: Melodye Ped, NP Subject: Hospice nurse call - daily medications         Pt's family told the Hospice nurse, Shelda Pal, that [facility] is having difficulty swallowing. She instructed the family how to crush and/or dissolve his pain med and haldol. Shelda Pal, is asking if we had opinion on just stopping his daily medications? She did discuss comfort meds only with the family and they were in agreement with that.

## 2020-09-14 ENCOUNTER — Inpatient Hospital Stay (HOSPITAL_COMMUNITY): Admission: RE | Admit: 2020-09-14 | Payer: Medicare HMO | Source: Ambulatory Visit

## 2020-09-15 ENCOUNTER — Inpatient Hospital Stay (HOSPITAL_COMMUNITY)
Admission: EM | Admit: 2020-09-15 | Discharge: 2020-09-21 | DRG: 637 | Disposition: A | Discharge status: Hospice | Attending: Internal Medicine | Admitting: Internal Medicine

## 2020-09-15 ENCOUNTER — Encounter (HOSPITAL_COMMUNITY): Payer: Self-pay

## 2020-09-15 ENCOUNTER — Other Ambulatory Visit: Payer: Self-pay

## 2020-09-15 DIAGNOSIS — R64 Cachexia: Secondary | ICD-10-CM | POA: Diagnosis present

## 2020-09-15 DIAGNOSIS — K831 Obstruction of bile duct: Secondary | ICD-10-CM | POA: Diagnosis present

## 2020-09-15 DIAGNOSIS — E876 Hypokalemia: Secondary | ICD-10-CM | POA: Diagnosis present

## 2020-09-15 DIAGNOSIS — E870 Hyperosmolality and hypernatremia: Secondary | ICD-10-CM | POA: Diagnosis present

## 2020-09-15 DIAGNOSIS — K219 Gastro-esophageal reflux disease without esophagitis: Secondary | ICD-10-CM | POA: Diagnosis present

## 2020-09-15 DIAGNOSIS — Z931 Gastrostomy status: Secondary | ICD-10-CM | POA: Diagnosis not present

## 2020-09-15 DIAGNOSIS — E872 Acidosis: Secondary | ICD-10-CM

## 2020-09-15 DIAGNOSIS — E860 Dehydration: Secondary | ICD-10-CM | POA: Diagnosis present

## 2020-09-15 DIAGNOSIS — I10 Essential (primary) hypertension: Secondary | ICD-10-CM | POA: Diagnosis present

## 2020-09-15 DIAGNOSIS — E43 Unspecified severe protein-calorie malnutrition: Secondary | ICD-10-CM | POA: Diagnosis present

## 2020-09-15 DIAGNOSIS — R197 Diarrhea, unspecified: Secondary | ICD-10-CM | POA: Diagnosis present

## 2020-09-15 DIAGNOSIS — E785 Hyperlipidemia, unspecified: Secondary | ICD-10-CM | POA: Diagnosis present

## 2020-09-15 DIAGNOSIS — R6510 Systemic inflammatory response syndrome (SIRS) of non-infectious origin without acute organ dysfunction: Secondary | ICD-10-CM | POA: Diagnosis present

## 2020-09-15 DIAGNOSIS — D75839 Thrombocytosis, unspecified: Secondary | ICD-10-CM | POA: Diagnosis present

## 2020-09-15 DIAGNOSIS — R7401 Elevation of levels of liver transaminase levels: Secondary | ICD-10-CM | POA: Diagnosis present

## 2020-09-15 DIAGNOSIS — Z66 Do not resuscitate: Secondary | ICD-10-CM | POA: Diagnosis present

## 2020-09-15 DIAGNOSIS — Z6820 Body mass index (BMI) 20.0-20.9, adult: Secondary | ICD-10-CM

## 2020-09-15 DIAGNOSIS — G9341 Metabolic encephalopathy: Secondary | ICD-10-CM | POA: Diagnosis present

## 2020-09-15 DIAGNOSIS — E1110 Type 2 diabetes mellitus with ketoacidosis without coma: Principal | ICD-10-CM | POA: Diagnosis present

## 2020-09-15 DIAGNOSIS — R54 Age-related physical debility: Secondary | ICD-10-CM | POA: Diagnosis present

## 2020-09-15 DIAGNOSIS — I959 Hypotension, unspecified: Secondary | ICD-10-CM | POA: Diagnosis present

## 2020-09-15 DIAGNOSIS — R1310 Dysphagia, unspecified: Secondary | ICD-10-CM | POA: Diagnosis present

## 2020-09-15 DIAGNOSIS — N179 Acute kidney failure, unspecified: Secondary | ICD-10-CM | POA: Diagnosis present

## 2020-09-15 DIAGNOSIS — C259 Malignant neoplasm of pancreas, unspecified: Secondary | ICD-10-CM | POA: Diagnosis present

## 2020-09-15 DIAGNOSIS — Z515 Encounter for palliative care: Secondary | ICD-10-CM | POA: Diagnosis not present

## 2020-09-15 DIAGNOSIS — K760 Fatty (change of) liver, not elsewhere classified: Secondary | ICD-10-CM | POA: Diagnosis present

## 2020-09-15 DIAGNOSIS — E1310 Other specified diabetes mellitus with ketoacidosis without coma: Secondary | ICD-10-CM | POA: Diagnosis not present

## 2020-09-15 DIAGNOSIS — Z20822 Contact with and (suspected) exposure to covid-19: Secondary | ICD-10-CM | POA: Diagnosis present

## 2020-09-15 DIAGNOSIS — Z79899 Other long term (current) drug therapy: Secondary | ICD-10-CM

## 2020-09-15 DIAGNOSIS — Z7984 Long term (current) use of oral hypoglycemic drugs: Secondary | ICD-10-CM

## 2020-09-15 DIAGNOSIS — Z7189 Other specified counseling: Secondary | ICD-10-CM | POA: Diagnosis not present

## 2020-09-15 LAB — URINALYSIS, ROUTINE W REFLEX MICROSCOPIC
Bilirubin Urine: NEGATIVE
Glucose, UA: >=500 mg/dL — AB
Hgb urine dipstick: NEGATIVE
Ketones, ur: NEGATIVE mg/dL
Leukocytes,Ua: NEGATIVE
Nitrite: NEGATIVE
Protein, ur: NEGATIVE mg/dL
Specific Gravity, Urine: 1.013 (ref 1.005–1.030)
pH: 5 (ref 5.0–8.0)

## 2020-09-15 LAB — I-STAT VENOUS BLOOD GAS, ED
Acid-base deficit: 9 mmol/L — ABNORMAL HIGH (ref 0.0–2.0)
Bicarbonate: 18.9 mmol/L — ABNORMAL LOW (ref 20.0–28.0)
Calcium, Ion: 1.09 mmol/L — ABNORMAL LOW (ref 1.15–1.40)
HCT: 30 % — ABNORMAL LOW (ref 39.0–52.0)
Hemoglobin: 10.2 g/dL — ABNORMAL LOW (ref 13.0–17.0)
O2 Saturation: 97 %
Potassium: 4.7 mmol/L (ref 3.5–5.1)
Sodium: 152 mmol/L — ABNORMAL HIGH (ref 135–145)
TCO2: 20 mmol/L — ABNORMAL LOW (ref 22–32)
pCO2, Ven: 48.5 mmHg (ref 44.0–60.0)
pH, Ven: 7.199 — CL (ref 7.250–7.430)
pO2, Ven: 111 mmHg — ABNORMAL HIGH (ref 32.0–45.0)

## 2020-09-15 LAB — BASIC METABOLIC PANEL
Anion gap: 15 (ref 5–15)
Anion gap: 13 (ref 5–15)
Anion gap: 11 (ref 5–15)
BUN: 96 mg/dL — ABNORMAL HIGH (ref 8–23)
BUN: 88 mg/dL — ABNORMAL HIGH (ref 8–23)
BUN: 81 mg/dL — ABNORMAL HIGH (ref 8–23)
CO2: 17 mmol/L — ABNORMAL LOW (ref 22–32)
CO2: 19 mmol/L — ABNORMAL LOW (ref 22–32)
CO2: 21 mmol/L — ABNORMAL LOW (ref 22–32)
Calcium: 8.5 mg/dL — ABNORMAL LOW (ref 8.9–10.3)
Calcium: 8.8 mg/dL — ABNORMAL LOW (ref 8.9–10.3)
Calcium: 8.7 mg/dL — ABNORMAL LOW (ref 8.9–10.3)
Chloride: 122 mmol/L — ABNORMAL HIGH (ref 98–111)
Chloride: 121 mmol/L — ABNORMAL HIGH (ref 98–111)
Chloride: 120 mmol/L — ABNORMAL HIGH (ref 98–111)
Creatinine, Ser: 4.22 mg/dL — ABNORMAL HIGH (ref 0.61–1.24)
Creatinine, Ser: 3.71 mg/dL — ABNORMAL HIGH (ref 0.61–1.24)
Creatinine, Ser: 3.42 mg/dL — ABNORMAL HIGH (ref 0.61–1.24)
GFR, Estimated: 15 mL/min — ABNORMAL LOW (ref >60)
GFR, Estimated: 17 mL/min — ABNORMAL LOW (ref >60)
GFR, Estimated: 19 mL/min — ABNORMAL LOW (ref >60)
Glucose, Bld: 101 mg/dL — ABNORMAL HIGH (ref 70–99)
Glucose, Bld: 198 mg/dL — ABNORMAL HIGH (ref 70–99)
Glucose, Bld: 190 mg/dL — ABNORMAL HIGH (ref 70–99)
Potassium: 3.7 mmol/L (ref 3.5–5.1)
Potassium: 3.6 mmol/L (ref 3.5–5.1)
Potassium: 3.6 mmol/L (ref 3.5–5.1)
Sodium: 154 mmol/L — ABNORMAL HIGH (ref 135–145)
Sodium: 153 mmol/L — ABNORMAL HIGH (ref 135–145)
Sodium: 152 mmol/L — ABNORMAL HIGH (ref 135–145)

## 2020-09-15 LAB — CBG MONITORING, ED
Glucose-Capillary: 564 mg/dL (ref 70–99)
Glucose-Capillary: 512 mg/dL (ref 70–99)
Glucose-Capillary: 464 mg/dL — ABNORMAL HIGH (ref 70–99)
Glucose-Capillary: 329 mg/dL — ABNORMAL HIGH (ref 70–99)
Glucose-Capillary: 125 mg/dL — ABNORMAL HIGH (ref 70–99)
Glucose-Capillary: 162 mg/dL — ABNORMAL HIGH (ref 70–99)
Glucose-Capillary: 194 mg/dL — ABNORMAL HIGH (ref 70–99)

## 2020-09-15 LAB — COMPREHENSIVE METABOLIC PANEL
ALT: 89 U/L — ABNORMAL HIGH (ref 0–44)
AST: 56 U/L — ABNORMAL HIGH (ref 15–41)
Albumin: 2.6 g/dL — ABNORMAL LOW (ref 3.5–5.0)
Alkaline Phosphatase: 199 U/L — ABNORMAL HIGH (ref 38–126)
Anion gap: 21 — ABNORMAL HIGH (ref 5–15)
BUN: 114 mg/dL — ABNORMAL HIGH (ref 8–23)
CO2: 14 mmol/L — ABNORMAL LOW (ref 22–32)
Calcium: 8.8 mg/dL — ABNORMAL LOW (ref 8.9–10.3)
Chloride: 112 mmol/L — ABNORMAL HIGH (ref 98–111)
Creatinine, Ser: 5.85 mg/dL — ABNORMAL HIGH (ref 0.61–1.24)
GFR, Estimated: 10 mL/min — ABNORMAL LOW (ref >60)
Glucose, Bld: 659 mg/dL (ref 70–99)
Potassium: 5.5 mmol/L — ABNORMAL HIGH (ref 3.5–5.1)
Sodium: 147 mmol/L — ABNORMAL HIGH (ref 135–145)
Total Bilirubin: 7.1 mg/dL — ABNORMAL HIGH (ref 0.3–1.2)
Total Protein: 6.5 g/dL (ref 6.5–8.1)

## 2020-09-15 LAB — CBC WITH DIFFERENTIAL/PLATELET
Abs Immature Granulocytes: 0.06 10*3/uL (ref 0.00–0.07)
Basophils Absolute: 0.1 10*3/uL (ref 0.0–0.1)
Basophils Relative: 0 %
Eosinophils Absolute: 0 10*3/uL (ref 0.0–0.5)
Eosinophils Relative: 0 %
HCT: 32.4 % — ABNORMAL LOW (ref 39.0–52.0)
Hemoglobin: 10.3 g/dL — ABNORMAL LOW (ref 13.0–17.0)
Immature Granulocytes: 0 %
Lymphocytes Relative: 13 %
Lymphs Abs: 1.9 10*3/uL (ref 0.7–4.0)
MCH: 30.8 pg (ref 26.0–34.0)
MCHC: 31.8 g/dL (ref 30.0–36.0)
MCV: 97 fL (ref 80.0–100.0)
Monocytes Absolute: 1 10*3/uL (ref 0.1–1.0)
Monocytes Relative: 7 %
Neutro Abs: 11.4 10*3/uL — ABNORMAL HIGH (ref 1.7–7.7)
Neutrophils Relative %: 80 %
Platelets: 541 10*3/uL — ABNORMAL HIGH (ref 150–400)
RBC: 3.34 MIL/uL — ABNORMAL LOW (ref 4.22–5.81)
RDW: 17.3 % — ABNORMAL HIGH (ref 11.5–15.5)
WBC: 14.4 10*3/uL — ABNORMAL HIGH (ref 4.0–10.5)
nRBC: 0 % (ref 0.0–0.2)

## 2020-09-15 LAB — LACTIC ACID, PLASMA
Lactic Acid, Venous: 5 mmol/L (ref 0.5–1.9)
Lactic Acid, Venous: 3.3 mmol/L (ref 0.5–1.9)

## 2020-09-15 LAB — BETA-HYDROXYBUTYRIC ACID: Beta-Hydroxybutyric Acid: 1.88 mmol/L — ABNORMAL HIGH (ref 0.05–0.27)

## 2020-09-15 LAB — SARS CORONAVIRUS 2 (TAT 6-24 HRS): SARS Coronavirus 2: NEGATIVE

## 2020-09-15 LAB — GLUCOSE, CAPILLARY
Glucose-Capillary: 186 mg/dL — ABNORMAL HIGH (ref 70–99)
Glucose-Capillary: 207 mg/dL — ABNORMAL HIGH (ref 70–99)

## 2020-09-15 LAB — AMMONIA: Ammonia: 49 umol/L — ABNORMAL HIGH (ref 9–35)

## 2020-09-15 MED ORDER — DEXTROSE 5 % IV SOLN: INTRAVENOUS | Status: DC

## 2020-09-15 MED ORDER — PANTOPRAZOLE SODIUM 40 MG PO PACK
40.0000 mg | PACK | Freq: Every day | ORAL | Status: DC
  Administered 2020-09-16 – 2020-09-21 (×6): 40 mg
  Filled 2020-09-15 (×6): qty 20

## 2020-09-15 MED ORDER — PANTOPRAZOLE SODIUM 40 MG PO TBEC
40.0000 mg | DELAYED_RELEASE_TABLET | Freq: Every day | ORAL | Status: DC
  Administered 2020-09-15: 40 mg via ORAL
  Filled 2020-09-15: qty 1

## 2020-09-15 MED ORDER — DEXTROSE-NACL 5-0.45 % IV SOLN: INTRAVENOUS | Status: DC

## 2020-09-15 MED ORDER — CHLORHEXIDINE GLUCONATE CLOTH 2 % EX PADS
6.0000 | MEDICATED_PAD | Freq: Every day | CUTANEOUS | Status: DC
  Administered 2020-09-15 – 2020-09-21 (×6): 6 via TOPICAL

## 2020-09-15 MED ORDER — OXYCODONE HCL 5 MG PO TABS
10.0000 mg | ORAL_TABLET | ORAL | Status: DC | PRN
  Administered 2020-09-18 – 2020-09-19 (×3): 10 mg
  Filled 2020-09-15 (×4): qty 2

## 2020-09-15 MED ORDER — VANCOMYCIN VARIABLE DOSE PER UNSTABLE RENAL FUNCTION (PHARMACIST DOSING): Status: DC

## 2020-09-15 MED ORDER — HALOPERIDOL 1 MG PO TABS
1.0000 mg | ORAL_TABLET | ORAL | Status: DC | PRN
  Administered 2020-09-16 – 2020-09-21 (×12): 1 mg
  Filled 2020-09-15 (×15): qty 1

## 2020-09-15 MED ORDER — ONDANSETRON HCL 4 MG PO TABS: 4.0000 mg | ORAL_TABLET | Freq: Four times a day (QID) | ORAL | Status: DC | PRN

## 2020-09-15 MED ORDER — HALOPERIDOL 1 MG PO TABS
1.0000 mg | ORAL_TABLET | ORAL | Status: DC | PRN
  Filled 2020-09-15: qty 1

## 2020-09-15 MED ORDER — ONDANSETRON HCL 4 MG/2ML IJ SOLN
4.0000 mg | Freq: Four times a day (QID) | INTRAMUSCULAR | Status: DC | PRN
  Administered 2020-09-18 – 2020-09-20 (×4): 4 mg via INTRAVENOUS
  Filled 2020-09-15 (×4): qty 2

## 2020-09-15 MED ORDER — LACTATED RINGERS IV BOLUS
20.0000 mL/kg | Freq: Once | INTRAVENOUS | Status: AC
  Administered 2020-09-15: 1192 mL via INTRAVENOUS

## 2020-09-15 MED ORDER — SODIUM CHLORIDE 0.9 % IV SOLN
2.0000 g | Freq: Once | INTRAVENOUS | Status: AC
  Administered 2020-09-15: 2 g via INTRAVENOUS
  Filled 2020-09-15: qty 2

## 2020-09-15 MED ORDER — SODIUM CHLORIDE 0.9 % IV SOLN
1.0000 g | INTRAVENOUS | Status: DC
  Administered 2020-09-16: 1 g via INTRAVENOUS
  Filled 2020-09-15: qty 1

## 2020-09-15 MED ORDER — SODIUM CHLORIDE 0.9 % IV BOLUS
500.0000 mL | Freq: Once | INTRAVENOUS | Status: AC
  Administered 2020-09-15: 500 mL via INTRAVENOUS

## 2020-09-15 MED ORDER — METRONIDAZOLE 500 MG/100ML IV SOLN
500.0000 mg | Freq: Three times a day (TID) | INTRAVENOUS | Status: DC
Start: 2020-09-15 — End: 2020-09-16
  Administered 2020-09-15 – 2020-09-16 (×3): 500 mg via INTRAVENOUS
  Filled 2020-09-15 (×4): qty 100

## 2020-09-15 MED ORDER — OSMOLITE 1.5 CAL PO LIQD: 1000.0000 mL | ORAL | Status: DC

## 2020-09-15 MED ORDER — INSULIN REGULAR(HUMAN) IN NACL 100-0.9 UT/100ML-% IV SOLN
INTRAVENOUS | Status: DC
  Administered 2020-09-15: 9.5 [IU]/h via INTRAVENOUS
  Filled 2020-09-15: qty 100

## 2020-09-15 MED ORDER — VANCOMYCIN HCL IN DEXTROSE 1-5 GM/200ML-% IV SOLN
1000.0000 mg | Freq: Once | INTRAVENOUS | Status: AC
  Administered 2020-09-15: 1000 mg via INTRAVENOUS
  Filled 2020-09-15: qty 200

## 2020-09-15 MED ORDER — ONDANSETRON HCL 4 MG/2ML IJ SOLN: 4.0000 mg | Freq: Four times a day (QID) | INTRAMUSCULAR | Status: DC | PRN

## 2020-09-15 MED ORDER — ACETAMINOPHEN 650 MG RE SUPP: 650.0000 mg | Freq: Four times a day (QID) | RECTAL | Status: DC | PRN

## 2020-09-15 MED ORDER — LACTATED RINGERS IV SOLN: INTRAVENOUS | Status: DC

## 2020-09-15 MED ORDER — HALOPERIDOL 1 MG PO TABS: 1.0000 mg | ORAL_TABLET | Freq: Every day | ORAL | Status: DC

## 2020-09-15 MED ORDER — HALOPERIDOL 1 MG PO TABS
1.0000 mg | ORAL_TABLET | Freq: Every day | ORAL | Status: DC
  Filled 2020-09-15: qty 1

## 2020-09-15 MED ORDER — DEXTROSE IN LACTATED RINGERS 5 % IV SOLN: INTRAVENOUS | Status: DC

## 2020-09-15 MED ORDER — HALOPERIDOL 1 MG PO TABS
1.0000 mg | ORAL_TABLET | Freq: Every day | ORAL | Status: DC
  Administered 2020-09-15 – 2020-09-20 (×6): 1 mg
  Filled 2020-09-15 (×8): qty 1

## 2020-09-15 MED ORDER — ACETAMINOPHEN 325 MG PO TABS: 650.0000 mg | ORAL_TABLET | Freq: Four times a day (QID) | ORAL | Status: DC | PRN

## 2020-09-15 MED ORDER — FREE WATER
200.0000 mL | Status: DC
  Administered 2020-09-15 – 2020-09-18 (×18): 200 mL

## 2020-09-15 MED ORDER — INSULIN GLARGINE-YFGN 100 UNIT/ML ~~LOC~~ SOLN
10.0000 [IU] | Freq: Every day | SUBCUTANEOUS | Status: DC
  Administered 2020-09-15 – 2020-09-16 (×2): 10 [IU] via SUBCUTANEOUS
  Filled 2020-09-15 (×2): qty 0.1

## 2020-09-15 MED ORDER — INSULIN ASPART 100 UNIT/ML IJ SOLN
0.0000 [IU] | Freq: Four times a day (QID) | INTRAMUSCULAR | Status: DC
  Administered 2020-09-16: 3 [IU] via SUBCUTANEOUS
  Administered 2020-09-16 (×2): 9 [IU] via SUBCUTANEOUS
  Administered 2020-09-16: 7 [IU] via SUBCUTANEOUS
  Administered 2020-09-16: 3 [IU] via SUBCUTANEOUS
  Administered 2020-09-17 (×2): 7 [IU] via SUBCUTANEOUS
  Administered 2020-09-17 – 2020-09-18 (×3): 5 [IU] via SUBCUTANEOUS
  Administered 2020-09-18: 7 [IU] via SUBCUTANEOUS
  Administered 2020-09-18: 3 [IU] via SUBCUTANEOUS
  Administered 2020-09-19: 2 [IU] via SUBCUTANEOUS
  Administered 2020-09-19 – 2020-09-20 (×2): 1 [IU] via SUBCUTANEOUS
  Administered 2020-09-21: 2 [IU] via SUBCUTANEOUS

## 2020-09-15 MED ORDER — DEXTROSE 50 % IV SOLN
0.0000 mL | INTRAVENOUS | Status: DC | PRN
  Administered 2020-09-20: 25 mL via INTRAVENOUS
  Filled 2020-09-15: qty 50

## 2020-09-15 MED ORDER — ALBUTEROL SULFATE (2.5 MG/3ML) 0.083% IN NEBU: 2.5000 mg | INHALATION_SOLUTION | Freq: Four times a day (QID) | RESPIRATORY_TRACT | Status: DC | PRN

## 2020-09-15 MED ORDER — ORAL CARE MOUTH RINSE
15.0000 mL | Freq: Two times a day (BID) | OROMUCOSAL | Status: DC
  Administered 2020-09-16 – 2020-09-19 (×5): 15 mL via OROMUCOSAL

## 2020-09-15 MED ORDER — LOPERAMIDE HCL 2 MG PO CAPS
2.0000 mg | ORAL_CAPSULE | ORAL | Status: DC | PRN
  Administered 2020-09-20: 2 mg via ORAL
  Filled 2020-09-15 (×2): qty 1

## 2020-09-15 MED ORDER — SODIUM CHLORIDE 0.9 % IV BOLUS
1000.0000 mL | Freq: Once | INTRAVENOUS | Status: AC
  Administered 2020-09-15: 1000 mL via INTRAVENOUS

## 2020-09-15 MED ORDER — SODIUM CHLORIDE 0.9% FLUSH
3.0000 mL | Freq: Two times a day (BID) | INTRAVENOUS | Status: DC
  Administered 2020-09-15 – 2020-09-20 (×9): 3 mL via INTRAVENOUS

## 2020-09-15 MED ORDER — ACETAMINOPHEN 650 MG RE SUPP
650.0000 mg | Freq: Four times a day (QID) | RECTAL | Status: DC | PRN
Start: 2020-09-15 — End: 2020-09-15

## 2020-09-15 MED ORDER — INSULIN ASPART 100 UNIT/ML IJ SOLN
0.0000 [IU] | Freq: Four times a day (QID) | INTRAMUSCULAR | Status: DC
Start: 2020-09-15 — End: 2020-09-15
  Administered 2020-09-15 (×2): 2 [IU] via SUBCUTANEOUS

## 2020-09-15 MED ORDER — CHLORHEXIDINE GLUCONATE 0.12 % MT SOLN
15.0000 mL | Freq: Two times a day (BID) | OROMUCOSAL | Status: DC
  Administered 2020-09-16 – 2020-09-20 (×8): 15 mL via OROMUCOSAL
  Filled 2020-09-15 (×9): qty 15

## 2020-09-15 MED ORDER — HEPARIN SODIUM (PORCINE) 5000 UNIT/ML IJ SOLN
5000.0000 [IU] | Freq: Three times a day (TID) | INTRAMUSCULAR | Status: DC
  Administered 2020-09-15 – 2020-09-20 (×14): 5000 [IU] via SUBCUTANEOUS
  Filled 2020-09-15 (×16): qty 1

## 2020-09-15 MED ORDER — ACETAMINOPHEN 325 MG PO TABS
650.0000 mg | ORAL_TABLET | Freq: Four times a day (QID) | ORAL | Status: DC | PRN
  Administered 2020-09-18 – 2020-09-19 (×5): 650 mg
  Filled 2020-09-15 (×5): qty 2

## 2020-09-15 MED ORDER — OSMOLITE 1.5 CAL PO LIQD
1000.0000 mL | ORAL | Status: DC
  Administered 2020-09-15: 1000 mL

## 2020-09-15 NOTE — Progress Notes (Signed)
Brief Palliative Medicine Progress Note:  PMT consult received and chart reviewed.   Noted [facility] is a current hospice patient with [facility] (an [facility] of [facility]). I spoke with hospice liaison, [facility], to notify organization that patient is in the [facility] ED. A [facility] representative will be coming to the hospital to discuss [facility] as they have an established relationship with [facility] and family. They do not feel PMT involvement is needed at this time.  [facility] will reach out to PMT directly if any needs arise.   Thank you for allowing PMT to assist in the care of this patient.  [facility] Palliative Medicine Team Team Phone: [phone] NO CHARGE

## 2020-09-15 NOTE — ED Notes (Signed)
Dr.Haviland successfully placed central line, triple lumen to right groin. Cleared for use per MD.

## 2020-09-15 NOTE — ED Notes (Signed)
No change in insulin drip rate per endotool.

## 2020-09-15 NOTE — ED Notes (Addendum)
IV team came and did not find any access for IV placement. IV team states no midline or PICC line access can be placed as pt has no veins. Dr.Smith notified that IV fluids are currently on pause and only insulin drip is running as IVF and insulin drip are not compatible. Dr.Smith reached out to [place] who is going to attempt a possible EJ. Dr.Smith notified that pt ultrasound IV placed by previous shift RN was infiltrated to right upper arm and is swollen. Intact pulses noted to RUE. Will continue to monitor.

## 2020-09-15 NOTE — ED Notes (Signed)
Insulin clamped at this time per endotool. D5NS started.

## 2020-09-15 NOTE — ED Triage Notes (Signed)
Pt comes via [facility], pt was discharged one week ago with hospice, pt LSN was 630am on 8/19, now not talking or following commands. CBG reading high, hypotensive with EMS in the 80's

## 2020-09-15 NOTE — Consult Note (Signed)
Patient Status: [facility] - ED  Assessment and Plan: Leakage around biliary drain  Patient known to IR s/p biliary drain placement 08/16/20 by Dr. Pascal Lux with internalization 08/17/20 by Dr. Laurence Ferrari.  Drain was last assessed under fluoro 08/23/20 and found to be in good position.  Patient was recently discharged from [facility] with consideration for hospice referral given his significant decline with poor prognosis.  He was last seen by Oncology 8/16 at which time hospice referral was made and patient entered home hospice care.  Of note, he was originally scheduled for cholangiogram yesterday at [facility], however this appointment was canceled in light of hospice status with comfort focus only.   Patient presented to ED today due to AMS with hyperglycemia.  Found to have diabetic ketoacidosis. Family wishes to treat his current condition and patient was started on insulin drip.  IR consulted for leakage around his biliary drain.   Drain assessed at bedside.  It is intact.  There is evidence of leakage around the drain.  No active leakage.  Drain flushes well without resistance. No leakage with flushing.  RN reports bag has been emptied once today after it was found to be full.  Could be that leakage is from flow around catheter when bag is allowed to overfill given stable appearance at bedside today.   TBili 7.1 which is an improvement from his last known value.   Will plan for possible drain exchange early next week based on patient needs/drain status as indicated.   ______________________________________________________________________   History of Present Illness: [facility] is a 65 y.o. male with past medical history of pancreatic adenocarcinoma.  Patient with recent prolonged hospitalization with poor prognosis.  He underwent biliary drain placement, now with internal external biliary drain in place since 08/17/20 with good output and improvement in his Tbili.  Patient recently entered  hospice a few days ago, however found to be altered by family with hyperglycemia and currently undergoing treatment for ketoacidosis.  Biliary drain with some leakage around site. IR consulted for assessment.   Allergies and medications reviewed.   Review of Systems: A 12 point ROS discussed and pertinent positives are indicated in the HPI above.  All other systems are negative.  Review of Systems  Unable to perform ROS: Mental status change   Vital Signs: BP 139/64   Pulse (!) 104   Temp 98.5 F (36.9 C) (Oral)   Resp (!) 24   SpO2 100%   Physical Exam Vitals and nursing note reviewed.  HENT:     Mouth/Throat:     Mouth: Mucous membranes are moist.  Cardiovascular:     Rate and Rhythm: Normal rate and regular rhythm.  Pulmonary:     Effort: Pulmonary effort is normal.     Breath sounds: Normal breath sounds.  Abdominal:     General: Abdomen is flat.     Palpations: Abdomen is soft.     Comments: RUQ I/E biliary drain in place.  Dark green bilious output in gravity bag which is tucked up under patient.  Repositioned to gravity drainage.  Site with evidence of leakage, none currently.  Flushes easily without resistance or back flow from around insertion site.   Neurological:     Mental Status: He is alert.     Imaging reviewed.   Labs:  COAGS: Recent Labs    08/15/20 0452 08/16/20 0553 08/27/20 1015 09/02/20 0345  INR 1.5* 1.1 1.2 1.4*    BMP: Recent Labs  09/02/20 0345 09/03/20 0056 09/05/20 0842 09/15/20 0455 09/15/20 0759  NA 138 137 144 147* 152*  K 3.6 3.3* 4.5 5.5* 4.7  CL 111 109 116* 112*  --   CO2 20* 19* 17* 14*  --   GLUCOSE 77 193* 210* 659*  --   BUN 17 18 24* 114*  --   CALCIUM 8.8* 8.6* 8.7* 8.8*  --   CREATININE 0.51* 0.64 0.71 5.85*  --   GFRNONAA >60 >60 >60 10*  --      Electronically Signed: Docia Barrier, PA 09/15/2020, 11:15 AM   I spent a total of 15 minutes in face to face in clinical consultation, greater  than 50% of which was counseling/coordinating care for venous access.

## 2020-09-15 NOTE — Progress Notes (Signed)
A STAT consult was placed to IV Therapy for a 2nd iv site;  RUA very edematous from infiltrated site started earlier;  Left arm assessed w ultrasound; no suitable veins noted; CrCl 12;  suggest central access for this pt as he is on multiple iv medications.  Pt has 1 iv site in his right hand.

## 2020-09-15 NOTE — ED Notes (Signed)
Ultrasound IV placed by previous RN is infiltrated. Removed and site appears swollen. Ace wrap applied. Intact pulses to RUE. Will continue to monitor.

## 2020-09-15 NOTE — H&P (Addendum)
History and Physical    Jeff Jordan [id number] DOB: [birth year] DOA: 09/15/2020  Referring MD/NP/PA: Veryl Speak, MD PCP: Georganna Skeans, PA-C  Patient coming from: Home via EMS  Chief Complaint: Altered mental status  I have personally briefly reviewed patient's old medical records in [place]   HPI: Jeff Jordan is a 65 y.o. 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  History is mostly obtained from review of records due to patient's current medical condition.  It was reported that the patient was last seen normal at around 6:30 AM yesterday, but was now not talking or following commands.  In route with EMS patient CBG reading was high and he was hypotensive with initial systolic blood pressures in the 80s.  He is a little more alert and complains of having a sore throat, abdominal pain, and notes leaking from his biliary drain.  Patient just recently hospitalized from 7/19-8/11 after being found to have a malignant biliary obstruction due to pancreatic head adenocarcinoma.  Status post biliary drain placement with IR on 7/21 after GI was unable  to place stent with ERCP on 7/19.  Biliary cultures grew out Candida glabrata and staph epidermis which patient had been on Eraxis and IV Unasyn. G-tube placed due to gastric outlet obstruction with IR on 8/1.  Patient was made DNR and ultimately sent home with hospice.  ED Course: Upon admission into the emergency department patient was seen to be afebrile with respirations 14-24, blood pressure 92/52 121/50, and all other vital signs maintained.  Labs significant for WBC 14.4, hemoglobin 10.3, platelets 541, sodium 147, potassium 5.5, CO2 14, BUN 114, creatinine 5.85, anion gap 21, alkaline phosphatase 199, albumin 2.6, AST 56, ALT 89, ammonia 49, total bilirubin 7.1, and beta  hydroxybutyrate 1.88.  Venous pH was noted to be 7.199.  Patient was bolused 1.5 L of normal saline IV fluids and started on insulin drip for hyperglycemia crisis protocol.  Review of Systems  Unable to perform ROS: Acuity of condition  HENT:  Positive for sore throat.   Gastrointestinal:  Positive for abdominal pain.   Past Medical History:  Diagnosis Date   Biliary obstruction    Essential hypertension    Gastroesophageal reflux    Hepatic steatosis    Mild hyperlipidemia    Type 2 diabetes mellitus [facility])     Past Surgical History:  Procedure Laterality Date   BIOPSY  08/14/2020   Procedure: BIOPSY;  Surgeon: Rush Landmark Telford Nab., MD;  Location: [facility];  Service: Gastroenterology;;   CATARACT EXTRACTION Bilateral    ENDOSCOPIC RETROGRADE CHOLANGIOPANCREATOGRAPHY (ERCP) WITH PROPOFOL N/A 08/14/2020   Procedure: ENDOSCOPIC RETROGRADE CHOLANGIOPANCREATOGRAPHY (ERCP) WITH PROPOFOL;  Surgeon: Irving Copas., MD;  Location: [facility];  Service: Gastroenterology;  Laterality: N/A;  aborted ercp   ESOPHAGOGASTRODUODENOSCOPY N/A 08/14/2020   Procedure: ESOPHAGOGASTRODUODENOSCOPY (EGD);  Surgeon: Irving Copas., MD;  Location: [facility];  Service: Gastroenterology;  Laterality: N/A;   EUS  08/14/2020   Procedure: UPPER ENDOSCOPIC ULTRASOUND (EUS) LINEAR;  Surgeon: Irving Copas., MD;  Location: [facility];  Service: Gastroenterology;;   FINE NEEDLE ASPIRATION  08/14/2020   Procedure: FINE NEEDLE ASPIRATION (FNA) LINEAR;  Surgeon: Irving Copas., MD;  Location: [facility];  Service: Gastroenterology;;   IR BILIARY DRAIN PLACEMENT WITH CHOLANGIOGRAM  08/16/2020   IR CHOLANGIOGRAM EXISTING TUBE  08/23/2020   IR CONVERT BILIARY  DRAIN TO INT EXT BILIARY DRAIN  08/17/2020   IR GASTROSTOMY TUBE MOD SED  08/27/2020   NECK SURGERY       reports that he quit smoking about 20 years ago. His smoking use included cigarettes. He has a 12.00 pack-year  smoking history. He has never used smokeless tobacco. He reports that he does not currently use alcohol. He reports that he does not currently use drugs.  No Known Allergies  Family History  Family history unknown: Yes    Prior to Admission medications   Medication Sig Start Date End Date Taking? Authorizing Provider  amLODipine (NORVASC) 5 MG tablet Take 5 mg by mouth daily. 06/19/20  Yes [provider]  carvedilol (COREG) 6.25 MG tablet Take 1 tablet (6.25 mg total) by mouth 2 (two) times daily. 09/06/20  Yes Charlynne Cousins, MD  ferrous sulfate 325 (65 FE) MG tablet Take 325 mg by mouth daily with breakfast.   Yes [provider]  haloperidol (HALDOL) 1 MG tablet 1 tab po q HS, and every 4 hours as needed for agitation. Patient taking differently: Take 1 mg by mouth at bedtime. 09/13/20  Yes Dayton Scrape A, NP  irbesartan (AVAPRO) 300 MG tablet Take 1 tablet (300 mg total) by mouth daily. 09/06/20  Yes Charlynne Cousins, MD  JARDIANCE 25 MG TABS tablet Take 25 mg by mouth daily. 07/23/20  Yes [provider]  loperamide (IMODIUM) 2 MG capsule Take 2 mg by mouth as needed for diarrhea or loose stools.   Yes [provider]  metFORMIN (GLUCOPHAGE) 1000 MG tablet Take 1,000 mg by mouth 2 (two) times daily. 07/16/20  Yes [provider]  Nutritional Supplements (FEEDING SUPPLEMENT, OSMOLITE 1.5 CAL,) LIQD Place 1,000 mLs into feeding tube continuous. 09/06/20  Yes Charlynne Cousins, MD  olmesartan (BENICAR) 40 MG tablet Take 40 mg by mouth daily. 06/25/20  Yes [provider]  omeprazole (PRILOSEC) 40 MG capsule Take 40 mg by mouth every morning. 03/01/20  Yes [provider]  ondansetron (ZOFRAN) 4 MG tablet Take 1 tablet (4 mg total) by mouth every 4 (four) hours as needed for nausea. 08/02/20  Yes Dayton Scrape A, NP  Oxycodone HCl 10 MG TABS Take 1 tablet (10 mg total) by mouth every 4 (four) hours as needed. Patient  taking differently: Take 10 mg by mouth every 4 (four) hours as needed (pain). 08/02/20  Yes Melodye Ped, NP  Water For Irrigation, Sterile (FREE WATER) SOLN Place 200 mLs into feeding tube every 4 (four) hours. 09/06/20  Yes Charlynne Cousins, MD    Physical Exam:  Constitutional: Cachectic and chronically ill appearing elderly male who is alert able to answer questions at this Vitals:   09/15/20 0515 09/15/20 0545 09/15/20 0615 09/15/20 0700  BP: (!) 116/44 (!) 113/49 (!) 121/50 (!) 101/46  Pulse: 97 92 98 96  Resp: (!) '23 14 19 16  '$ Temp:      TempSrc:      SpO2: 100% 100% 100% 100%   Eyes: Pupils are small, but reactive to light, sclera icterus appreciated. ENMT: Mucous membranes are dry. Posterior pharynx clear of any exudate or lesions. Edentulous Neck: normal, supple, no masses, no thyromegaly Respiratory: clear to auscultation bilaterally, no wheezing, no crackles. Normal respiratory effort. No accessory muscle use.  Cardiovascular: Regular rate and rhythm, no murmurs / rubs / gallops. No extremity edema. 2+ pedal pulses. No carotid bruits.  Abdomen:   G-tube in place on  the left side of the abdomen and biliary drain present on the right side of the abdomen with some leakage appreciated. Musculoskeletal: no clubbing / cyanosis. No joint deformity upper and lower extremities. Good ROM, no contractures. Normal muscle tone.  Skin: Poor skin turgor and jaundice skin Neurologic: CN 2-12 grossly intact.  Patient appears able to move all extremities. Psychiatric: Awake, but slow to respond.    Labs on Admission: I have personally reviewed following labs and imaging studies  CBC: Recent Labs  Lab 09/15/20 0455  WBC 14.4*  NEUTROABS 11.4*  HGB 10.3*  HCT 32.4*  MCV 97.0  PLT [id number]*   Basic Metabolic Panel: Recent Labs  Lab 09/15/20 0455  NA 147*  K 5.5*  CL 112*  CO2 14*  GLUCOSE 659*  BUN 114*  CREATININE 5.85*  CALCIUM 8.8*   GFR: Estimated Creatinine  Clearance: 10.5 mL/min (A) (by C-G formula based on SCr of 5.85 mg/dL (H)). Liver Function Tests: Recent Labs  Lab 09/15/20 0455  AST 56*  ALT 89*  ALKPHOS 199*  BILITOT 7.1*  PROT 6.5  ALBUMIN 2.6*   No results for input(s): LIPASE, AMYLASE in the last 168 hours. Recent Labs  Lab 09/15/20 0455  AMMONIA 49*   Coagulation Profile: No results for input(s): INR, PROTIME in the last 168 hours. Cardiac Enzymes: No results for input(s): CKTOTAL, CKMB, CKMBINDEX, TROPONINI in the last 168 hours. BNP (last 3 results) No results for input(s): PROBNP in the last 8760 hours. HbA1C: No results for input(s): HGBA1C in the last 72 hours. CBG: Recent Labs  Lab 09/15/20 0220 09/15/20 0651  GLUCAP 564* 512*   Lipid Profile: No results for input(s): CHOL, HDL, LDLCALC, TRIG, CHOLHDL, LDLDIRECT in the last 72 hours. Thyroid Function Tests: No results for input(s): TSH, T4TOTAL, FREET4, T3FREE, THYROIDAB in the last 72 hours. Anemia Panel: No results for input(s): VITAMINB12, FOLATE, FERRITIN, TIBC, IRON, RETICCTPCT in the last 72 hours. Urine analysis: No results found for: COLORURINE, APPEARANCEUR, LABSPEC, PHURINE, GLUCOSEU, HGBUR, BILIRUBINUR, KETONESUR, PROTEINUR, UROBILINOGEN, NITRITE, LEUKOCYTESUR Sepsis Labs: No results found for this or any previous visit (from the past 240 hour(s)).   Radiological Exams on Admission: No results found.  EKG: Independently reviewed.  Sinus rhythm at 93 bpm with QTC 509  Assessment/Plan DKA type II metabolic acidosis with elevated anion gap secondary to DKA: Acute.  Patient presents with glucose elevated up to 656 with CO2 14, anion gap 21, venous pH 7.199, and beta hydroxybutyrate 1.88.  The metabolic anion gap likely secondary to DKA and/or lactic acidosis.  Urinalysis had not been obtained yet.  Patient was given 1.5 L of IV fluids and started on insulin drip. -Admit to progressive -Hyperglycemic crisis order set utilized -IV fluids and  insulin drip per protocol -Check urinalysis -Hold metformin and GERD -Serial BMPs -Correct electrolytes as needed -Monitoring for AG closure and will transition to subcutaneous insulin once able  SIRS: Acute.  On admission WBC 14.4 with patient noted to be tachycardic and lactic acid 5.   Suspect symptoms all to be related with DKA and dehydration, but question possibility of underlying infection.  During last hospitalization patient had been on Unasyn and Eraxis for moderate Staph epidermidis and Candida glabrata from biliary drain cultures. -Follow-up urinalysis and blood cultures -Trend lactic acid level -Recheck CBC in a.m. -Empiric antibiotics vancomycin, cefepime, and metronidazole -Consider de-escalating antibiotic ointment medically appropriate  Transient hypotension: Acute.  Patient presented with blood pressures noted to be as low as 92/50.  Patient had some improvement in blood pressures.  Home blood pressure medications include amlodipine 5 mg daily, Coreg 6.25 mg twice daily, irbesartan 300 mg daily, and Benicar 40 mg daily -Hold home blood pressure medications -Continue IV fluids -Goal MAP greater than 65  Acute metabolic encephalopathy: Patient noted to be acutely altered was not responding family this morning for which EMS was called.  Symptoms most likely multifactorial in the setting of DKA, acute kidney injury secondary to dehydration, ammonia level mildly elevated at 49, possible infection, and/or medication. -Neurochecks  Acute renal failure secondary to severe dehydration: Patient presents with creatinine elevated up to 5.85 with BUN 114.  Previously creatinine was noted to be within normal limits at 0.71 just 10 days ago. -Strict intake and output -IV fluids -Avoid nephrotoxic agent -Continue to monitor kidney function  Pancreatic adenocarcinoma with biliary obstruction: During his recent hospitalization he had a external/internal biliary drain placed by IR.  Noted  to have some leakage around the biliary drain.  Patient had been sent home with home hospice after discharge on 8/11 from the hospital.   The patient's daughter was reported to have wanted treatment at this time for his symptoms.  Given patient's poor overall prognosis that he is on hospice would recommend comfort care measures patient does not appear to be improving. -IR consulted for biliary drain -Palliative care consulted -Continue oxycodone as needed per PEG  Transaminitis and hyperbilirubinemia: Chronic.  Alkaline phosphatase 199, AST 56, ALT 89, and total bilirubin 7.1.  Most levels appear to be trending down, but secondary to patient's pancreatic cancer. -Continue to monitor  Thrombocytosis: Acute.  Platelet count 541.  Suspect reactive in nature. -Continue to monitor  Severe protein calorie malnutrition dysphagia s/p G-tube: Albumin noted to be 2.6 and patient severely cachectic.  G-tube in place, but unclear of its been functioning properly as notes hospice talk about crushing meds for the patient -Will assess to see if G-tube function -Will reassess once able to get in contact with patient's daughter the patient had been on tube feeds or not  DNR: Prior to arrival.  DVT prophylaxis: Heparin Code Status: DNR Family Communication: Attempted to call patient's daughter 4 times, but mailbox is full Disposition Plan: To be determined Consults called: Palliative care, IR Admission status: Inpatient, require more than 2 midnight stay  Norval Morton MD [facility]   If 7PM-7AM, please contact night-coverage   09/15/2020, 7:30 AM

## 2020-09-15 NOTE — ED Provider Notes (Signed)
  Physical Exam  BP (!) 115/52   Pulse 95   Temp 98.2 F (36.8 C) (Oral)   Resp 16   SpO2 100%   Physical Exam  ED Course/Procedures     .Central Line  Date/Time: 09/15/2020 9:13 AM Performed by: Isla Pence, MD Authorized by: Isla Pence, MD   Consent:    Consent obtained:  Emergent situation Universal protocol:    Patient identity confirmed:  Arm band Pre-procedure details:    Indication(s): central venous access and insufficient peripheral access     Hand hygiene: Hand hygiene performed prior to insertion     Sterile barrier technique: All elements of maximal sterile technique followed     Skin preparation:  Chlorhexidine   Skin preparation agent: Skin preparation agent completely dried prior to procedure   Sedation:    Sedation type:  None Anesthesia:    Anesthesia method:  Topical application Procedure details:    Location:  R femoral   Patient position:  Supine   Procedural supplies:  Triple lumen   Catheter size:  7 Fr   Landmarks identified: yes     Ultrasound guidance: no     Number of attempts:  1   Successful placement: yes   Post-procedure details:    Post-procedure:  Dressing applied and line sutured   Assessment:  Blood return through all ports and free fluid flow   Procedure completion:  Tolerated well, no immediate complications  MDM  Pt d/w Dr. Tamala Julian.  Pt requires a central line for access.  Line placed.       Isla Pence, MD 09/15/20 [phone]

## 2020-09-15 NOTE — ED Notes (Signed)
IV team consult placed. Attempted 3-5 times by previous RN for IV. Pt had an infiltrated ultrasound guided IV. Pt only has 1 access at this time. Will continue to monitor. Waiting for IV team.

## 2020-09-15 NOTE — Progress Notes (Signed)
Pharmacy Antibiotic Note  Jeff Jordan is a 65 y.o. male admitted on 09/15/2020 with  intra-abdominal infection .  Pharmacy has been consulted for Cefepime and metronidazole dosing.   WBC elevated, LA 5 > 3.3, SCr 5.85 (BL 0.6)  Plan: -Start Cefepime 1 gm IV Q 24 hours -Vancomycin 1250 mg IV load followed by dosing per levels  -Monitor CBC, renal fx, cultures and clinical progress     Temp (24hrs), Avg:98.4 F (36.9 C), Min:98.2 F (36.8 C), Max:98.5 F (36.9 C)  Recent Labs  Lab 09/15/20 0455 09/15/20 0703  WBC 14.4*  --   CREATININE 5.85*  --   LATICACIDVEN 5.0* 3.3*    Estimated Creatinine Clearance: 10.5 mL/min (A) (by C-G formula based on SCr of 5.85 mg/dL (H)).    No Known Allergies  Antimicrobials this admission: Cefepime 8/20 >>  Vancomycin 8/20 >>   Dose adjustments this admission:   Microbiology results: 8/20 BCx:    Thank you for allowing pharmacy to be a part of this patient's care.  Albertina Parr, PharmD., BCPS, BCCCP Clinical Pharmacist Please refer to [facility] for unit-specific pharmacist

## 2020-09-15 NOTE — ED Provider Notes (Signed)
[facility] EMERGENCY DEPARTMENT Provider Note   CSN: [id number] Arrival date & time: 09/15/20  0212     History Chief Complaint  Patient presents with   Altered Mental Status    Jeff Jordan is a 65 y.o. male.  Patient is a 65 year old male with past medical history of type 2 diabetes and adenocarcinoma of the pancreas.  Patient recently admitted for biliary obstruction with failed stent placement.  He then underwent percutaneous drain followed by internal to external drain placement.  Patient was discharged 9 days ago.  Since that time he has been evaluated by palliative care and hospice and has been made a DNR.  Patient presenting today with altered mental status.  According to family, he has been less verbal and not following commands.  Patient adds little additional history secondary to mental status.  The history is provided by the patient.      Past Medical History:  Diagnosis Date   Biliary obstruction    Essential hypertension    Gastroesophageal reflux    Hepatic steatosis    Mild hyperlipidemia    Type 2 diabetes mellitus ([place])     Patient Active Problem List   Diagnosis Date Noted   Cholangitis    Malnutrition of moderate degree 08/21/2020   Non-intractable vomiting    Pancreatic adenocarcinoma ([place]) 07/27/2020   Pancreatic mass 07/24/2020   Biliary obstruction 07/13/2020   RUQ pain 06/26/2020   Hepatic steatosis 03/30/2020   Type 2 diabetes mellitus with diabetic polyneuropathy, without long-term current use of insulin ([place]) 03/23/2020   Essential hypertension 03/23/2020   Gastroesophageal reflux disease without esophagitis 03/23/2020   Mixed hyperlipidemia 03/23/2020    Past Surgical History:  Procedure Laterality Date   BIOPSY  08/14/2020   Procedure: BIOPSY;  Surgeon: Irving Copas., MD;  Location: [facility];  Service: Gastroenterology;;   CATARACT EXTRACTION Bilateral    ENDOSCOPIC RETROGRADE  CHOLANGIOPANCREATOGRAPHY (ERCP) WITH PROPOFOL N/A 08/14/2020   Procedure: ENDOSCOPIC RETROGRADE CHOLANGIOPANCREATOGRAPHY (ERCP) WITH PROPOFOL;  Surgeon: Irving Copas., MD;  Location: [facility];  Service: Gastroenterology;  Laterality: N/A;  aborted ercp   ESOPHAGOGASTRODUODENOSCOPY N/A 08/14/2020   Procedure: ESOPHAGOGASTRODUODENOSCOPY (EGD);  Surgeon: Irving Copas., MD;  Location: [facility];  Service: Gastroenterology;  Laterality: N/A;   EUS  08/14/2020   Procedure: UPPER ENDOSCOPIC ULTRASOUND (EUS) LINEAR;  Surgeon: Irving Copas., MD;  Location: [facility];  Service: Gastroenterology;;   FINE NEEDLE ASPIRATION  08/14/2020   Procedure: FINE NEEDLE ASPIRATION (FNA) LINEAR;  Surgeon: Irving Copas., MD;  Location: [facility];  Service: Gastroenterology;;   IR BILIARY DRAIN PLACEMENT WITH CHOLANGIOGRAM  08/16/2020   IR CHOLANGIOGRAM EXISTING TUBE  08/23/2020   IR CONVERT BILIARY DRAIN TO INT EXT BILIARY DRAIN  08/17/2020   IR GASTROSTOMY TUBE MOD SED  08/27/2020   NECK SURGERY         Family History  Family history unknown: Yes    Social History   Tobacco Use   Smoking status: Former    Packs/day: 1.00    Years: 12.00    Pack years: 12.00    Types: Cigarettes    Quit date: 01/28/2000    Years since quitting: 20.6   Smokeless tobacco: Never  Vaping Use   Vaping Use: Never used  Substance Use Topics   Alcohol use: Not Currently    Comment: quit 2002 usednto drink alot   Drug use: Not Currently    Home Medications Prior to Admission medications  Medication Sig Start Date End Date Taking? Authorizing Provider  amLODipine (NORVASC) 5 MG tablet Take 5 mg by mouth daily. 06/19/20   [provider]  carvedilol (COREG) 6.25 MG tablet Take 1 tablet (6.25 mg total) by mouth 2 (two) times daily. 09/06/20   Charlynne Cousins, MD  ferrous sulfate 325 (65 FE) MG tablet Take 325 mg by mouth daily with breakfast.    [provider]   haloperidol (HALDOL) 1 MG tablet 1 tab po q HS, and every 4 hours as needed for agitation. 09/13/20   Dayton Scrape A, NP  irbesartan (AVAPRO) 300 MG tablet Take 1 tablet (300 mg total) by mouth daily. 09/06/20   Charlynne Cousins, MD  JARDIANCE 25 MG TABS tablet Take 25 mg by mouth daily. 07/23/20   [provider]  loperamide (IMODIUM) 2 MG capsule Take by mouth as needed for diarrhea or loose stools.    [provider]  metFORMIN (GLUCOPHAGE) 1000 MG tablet Take 1,000 mg by mouth 2 (two) times daily. 07/16/20   [provider]  Nutritional Supplements (FEEDING SUPPLEMENT, OSMOLITE 1.5 CAL,) LIQD Place 1,000 mLs into feeding tube continuous. 09/06/20   Charlynne Cousins, MD  olmesartan (BENICAR) 40 MG tablet Take 40 mg by mouth daily. 06/25/20   [provider]  omeprazole (PRILOSEC) 40 MG capsule Take 40 mg by mouth every morning. 03/01/20   [provider]  ondansetron (ZOFRAN) 4 MG tablet Take 1 tablet (4 mg total) by mouth every 4 (four) hours as needed for nausea. 08/02/20   Dayton Scrape A, NP  Oxycodone HCl 10 MG TABS Take 1 tablet (10 mg total) by mouth every 4 (four) hours as needed. Patient taking differently: Take 10 mg by mouth every 4 (four) hours as needed (pain). 08/02/20   Dayton Scrape A, NP  pioglitazone (ACTOS) 45 MG tablet Take 45 mg by mouth daily. 06/25/20   [provider]  Water For Irrigation, Sterile (FREE WATER) SOLN Place 200 mLs into feeding tube every 4 (four) hours. 09/06/20   Charlynne Cousins, MD    Allergies    Patient has no known allergies.  Review of Systems   Review of Systems  Unable to perform ROS: Acuity of condition   Physical Exam Updated Vital Signs BP (!) 103/49   Pulse 93   Temp 98.2 F (36.8 C) (Oral)   Resp (!) 23   SpO2 100%   Physical Exam Vitals and nursing note reviewed.  Constitutional:      General: He is not in acute distress.    Appearance: He is well-developed. He  is not diaphoretic.     Comments: Patient is a chronically ill-appearing 65 year old male in no acute distress.  He is disoriented and unable to provide additional history.  HENT:     Head: Normocephalic and atraumatic.  Eyes:     General: Scleral icterus present.  Cardiovascular:     Rate and Rhythm: Normal rate and regular rhythm.     Heart sounds: No murmur heard.   No friction rub.  Pulmonary:     Effort: Pulmonary effort is normal. No respiratory distress.     Breath sounds: Normal breath sounds. No wheezing or rales.  Abdominal:     General: Bowel sounds are normal. There is no distension.     Palpations: Abdomen is soft.     Tenderness: There is no abdominal tenderness.  Musculoskeletal:        General: Normal range of  motion.     Cervical back: Normal range of motion and neck supple.  Skin:    General: Skin is warm and dry.     Coloration: Skin is jaundiced.  Neurological:     Mental Status: He is alert.     Coordination: Coordination normal.     Comments: Patient is awake, but appears slow and disoriented.  He is only occasionally able to follow simple commands.    ED Results / Procedures / Treatments   Labs (all labs ordered are listed, but only abnormal results are displayed) Labs Reviewed  CBG MONITORING, ED - Abnormal; Notable for the following components:      Result Value   Glucose-Capillary 564 (*)    All other components within normal limits  COMPREHENSIVE METABOLIC PANEL  CBC WITH DIFFERENTIAL/PLATELET  LACTIC ACID, PLASMA  LACTIC ACID, PLASMA  AMMONIA    EKG None  Radiology No results found.  Procedures Procedures   Medications Ordered in ED Medications  sodium chloride 0.9 % bolus 500 mL (has no administration in time range)    ED Course  I have reviewed the triage vital signs and the nursing notes.  Pertinent labs & imaging results that were available during my care of the patient were reviewed by me and considered in my medical  decision making (see chart for details).    MDM Rules/Calculators/A&P  Patient with newly diagnosed pancreatic cancer who has recently been made a DNR and appears to be under hospice care.  Patient sent here for altered mental status.  The daughter noted him to be less responsive and had twitching of his right arm and hand.  Patient has little additional history secondary to mental status change.  Work-up initiated including laboratory studies.  Patient has returned with a markedly elevated blood sugar, lactate of 14 and elevated anion gap consistent with ketoacidosis.  Patient started on IV fluids and insulin drip.  I spoke with the patient's daughter Lorriane Shire regarding his situation.  She is requesting that he be treated for his ketoacidosis understanding that this is not a cure for his pancreatic cancer.  Patient to be admitted to the hospitalist service for further care.  CRITICAL CARE Performed by: Veryl Speak Total critical care time: 45 minutes Critical care time was exclusive of separately billable procedures and treating other patients. Critical care was necessary to treat or prevent imminent or life-threatening deterioration. Critical care was time spent personally by me on the following activities: development of treatment plan with patient and/or surrogate as well as nursing, discussions with consultants, evaluation of patient's response to treatment, examination of patient, obtaining history from patient or surrogate, ordering and performing treatments and interventions, ordering and review of laboratory studies, ordering and review of radiographic studies, pulse oximetry and re-evaluation of patient's condition.   Final Clinical Impression(s) / ED Diagnoses Final diagnoses:  None    Rx / DC Orders ED Discharge Orders     None        Veryl Speak, MD 09/15/20 [phone]

## 2020-09-16 DIAGNOSIS — E1310 Other specified diabetes mellitus with ketoacidosis without coma: Secondary | ICD-10-CM | POA: Diagnosis not present

## 2020-09-16 DIAGNOSIS — E1110 Type 2 diabetes mellitus with ketoacidosis without coma: Secondary | ICD-10-CM | POA: Diagnosis not present

## 2020-09-16 LAB — BASIC METABOLIC PANEL
Anion gap: 12 (ref 5–15)
BUN: 77 mg/dL — ABNORMAL HIGH (ref 8–23)
CO2: 20 mmol/L — ABNORMAL LOW (ref 22–32)
Calcium: 8.6 mg/dL — ABNORMAL LOW (ref 8.9–10.3)
Chloride: 119 mmol/L — ABNORMAL HIGH (ref 98–111)
Creatinine, Ser: 3.06 mg/dL — ABNORMAL HIGH (ref 0.61–1.24)
GFR, Estimated: 22 mL/min — ABNORMAL LOW (ref >60)
Glucose, Bld: 307 mg/dL — ABNORMAL HIGH (ref 70–99)
Potassium: 3.7 mmol/L (ref 3.5–5.1)
Sodium: 151 mmol/L — ABNORMAL HIGH (ref 135–145)

## 2020-09-16 LAB — CBC
HCT: 31.3 % — ABNORMAL LOW (ref 39.0–52.0)
Hemoglobin: 10.2 g/dL — ABNORMAL LOW (ref 13.0–17.0)
MCH: 30.7 pg (ref 26.0–34.0)
MCHC: 32.6 g/dL (ref 30.0–36.0)
MCV: 94.3 fL (ref 80.0–100.0)
Platelets: 451 10*3/uL — ABNORMAL HIGH (ref 150–400)
RBC: 3.32 MIL/uL — ABNORMAL LOW (ref 4.22–5.81)
RDW: 16.7 % — ABNORMAL HIGH (ref 11.5–15.5)
WBC: 13.4 10*3/uL — ABNORMAL HIGH (ref 4.0–10.5)
nRBC: 0 % (ref 0.0–0.2)

## 2020-09-16 LAB — COMPREHENSIVE METABOLIC PANEL
ALT: 71 U/L — ABNORMAL HIGH (ref 0–44)
AST: 56 U/L — ABNORMAL HIGH (ref 15–41)
Albumin: 2.1 g/dL — ABNORMAL LOW (ref 3.5–5.0)
Alkaline Phosphatase: 174 U/L — ABNORMAL HIGH (ref 38–126)
Anion gap: 10 (ref 5–15)
BUN: 75 mg/dL — ABNORMAL HIGH (ref 8–23)
CO2: 21 mmol/L — ABNORMAL LOW (ref 22–32)
Calcium: 8.5 mg/dL — ABNORMAL LOW (ref 8.9–10.3)
Chloride: 117 mmol/L — ABNORMAL HIGH (ref 98–111)
Creatinine, Ser: 2.76 mg/dL — ABNORMAL HIGH (ref 0.61–1.24)
GFR, Estimated: 25 mL/min — ABNORMAL LOW (ref >60)
Glucose, Bld: 353 mg/dL — ABNORMAL HIGH (ref 70–99)
Potassium: 3.7 mmol/L (ref 3.5–5.1)
Sodium: 148 mmol/L — ABNORMAL HIGH (ref 135–145)
Total Bilirubin: 5.4 mg/dL — ABNORMAL HIGH (ref 0.3–1.2)
Total Protein: 5.7 g/dL — ABNORMAL LOW (ref 6.5–8.1)

## 2020-09-16 LAB — GLUCOSE, CAPILLARY
Glucose-Capillary: 224 mg/dL — ABNORMAL HIGH (ref 70–99)
Glucose-Capillary: 348 mg/dL — ABNORMAL HIGH (ref 70–99)
Glucose-Capillary: 364 mg/dL — ABNORMAL HIGH (ref 70–99)
Glucose-Capillary: 436 mg/dL — ABNORMAL HIGH (ref 70–99)
Glucose-Capillary: 455 mg/dL — ABNORMAL HIGH (ref 70–99)

## 2020-09-16 LAB — MRSA NEXT GEN BY PCR, NASAL: MRSA by PCR Next Gen: NOT DETECTED

## 2020-09-16 MED ORDER — INSULIN GLARGINE-YFGN 100 UNIT/ML ~~LOC~~ SOLN
15.0000 [IU] | Freq: Once | SUBCUTANEOUS | Status: AC
  Administered 2020-09-16: 15 [IU] via SUBCUTANEOUS
  Filled 2020-09-16: qty 0.15

## 2020-09-16 MED ORDER — SODIUM CHLORIDE 0.9 % IV SOLN: INTRAVENOUS | Status: DC

## 2020-09-16 MED ORDER — INSULIN GLARGINE-YFGN 100 UNIT/ML ~~LOC~~ SOLN
25.0000 [IU] | Freq: Every day | SUBCUTANEOUS | Status: DC
  Filled 2020-09-16: qty 0.25

## 2020-09-16 MED ORDER — INSULIN ASPART 100 UNIT/ML IJ SOLN
6.0000 [IU] | Freq: Once | INTRAMUSCULAR | Status: AC
  Administered 2020-09-16: 6 [IU] via SUBCUTANEOUS

## 2020-09-16 MED ORDER — INSULIN ASPART 100 UNIT/ML IJ SOLN
8.0000 [IU] | Freq: Once | INTRAMUSCULAR | Status: AC
  Administered 2020-09-16: 8 [IU] via SUBCUTANEOUS

## 2020-09-16 NOTE — Progress Notes (Signed)
Patients cbg is [phone], Dr Broadus John paged, this number is off sliding scale

## 2020-09-16 NOTE — Progress Notes (Signed)
   09/16/20 1909  Assess: MEWS Score  Temp 98.5 F (36.9 C)  BP 128/61  Pulse Rate (!) 111  ECG Heart Rate (!) 112  Resp 20  SpO2 100 %  O2 Device Room Air  Assess: MEWS Score  MEWS Temp 0  MEWS Systolic 0  MEWS Pulse 2  MEWS RR 0  MEWS LOC 0  MEWS Score 2  MEWS Score Color Yellow  Assess: if the MEWS score is Yellow or Red  Were vital signs taken at a resting state? No  Focused Assessment No change from prior assessment  Does the patient meet 2 or more of the SIRS criteria? No  Does the patient have a confirmed or suspected source of infection? No  Provider and Rapid Response Notified? No  Early Detection of Sepsis Score *See Row Information* Low  MEWS guidelines implemented *See Row Information* No, vital signs rechecked (will be rechecked when PRN Haldol has taken effect)  Escalate  MEWS: Escalate Yellow: discuss with charge nurse/RN and consider discussing with provider and RRT  Notify: Charge Nurse/RN  Name of Charge Nurse/RN Notified Lyda Kalata, RN  Date Charge Nurse/RN Notified 09/16/20  Time Charge Nurse/RN Notified 1909

## 2020-09-16 NOTE — Progress Notes (Signed)
PROGRESS NOTE    Jeff Jordan  [id number] DOB: 1956-01-23 DOA: 09/15/2020 PCP: Georganna Skeans, PA-C  Brief Narrative:Jeff Jordan is a 65 y.o. 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 -Recently hospitalized with malignant biliary obstruction due to pancreatic adenocarcinoma, underwent external biliary drain placement by IR on 7/21 after GI was unable to place stent with ERCP, cultures grew Candida and staph epidermidis which were treated with Eraxis and IV Unasyn, G-tube was placed due to gastric outlet obstruction in IR on 8/1. -He was made DNR, subsequently followed up with his oncologist, he was felt to be too frail for further active treatment and home hospice services were set up by Dr. Anabel Bene. -Brought to the ED due to worsening mental status, blood pressure noted to be low, labs were notable for BUN of 114, creatinine of 5.8 with a bicarb of 14 and blood glucose of 659. -He was started on IV fluids and an insulin drip -Also started on empiric antibiotics for leukocytosis, lactic acidosis -IR was consulted for biliary drain exchange  Assessment & Plan:   DKA type II metabolic acidosis with elevated anion gap secondary to DKA: - Acute.  Patient presents with glucose elevated up to 656 with CO2 14, anion gap 21, venous pH 7.199, and beta hydroxybutyrate 1.88.  The metabolic anion gap likely secondary to DKA and/acute kidney injury -DKA has corrected, CBGs remain elevated -Increase insulin glargine dose, give extra now, continue sliding scale insulin -Tube feeds resumed  SIRS/lactic acidosis  -Likely secondary to DKA, AKI and dehydration  -Will discontinue antibiotics and monitor  Hypotension -Transient, resolved, amlodipine Coreg irbesartan and Benicar on hold  Acute metabolic encephalopathy:  - Symptoms  most likely multifactorial in the setting of DKA, acute kidney injury secondary to dehydration, ammonia level mildly elevated at 49 -Improving, monitor   Acute renal failure secondary to severe dehydration: Patient presents with creatinine elevated up to 5.85 with BUN 114.  Previously creatinine was noted to be within normal limits at 0.71 just 10 days ago. -Continue IV fluids today   Pancreatic adenocarcinoma with biliary obstruction:  -During his recent hospitalization he had a external/internal biliary drain placed by IR.  Noted to have some leakage around the biliary drain.     -Seen by oncology after recent discharge and set up with home hospice -IR consulted for biliary drain -Palliative care consulted -Continue oxycodone as needed per PEG   Severe protein calorie malnutrition dysphagia s/p G-tube: Albumin noted to be 2.6 and patient severely cachectic.   -Tube feeds resumed   DNR: Prior to arrival.   DVT prophylaxis: Heparin Code Status: DNR Family Communication:no family at bedside, called and updated daughter Disposition Plan:  Status is: Inpatient  Remains inpatient appropriate because:Inpatient level of care appropriate due to severity of illness  Dispo: The patient is from: Home              Anticipated d/c is to: Home              Patient currently is not medically stable to d/c.   Difficult to place patient No   Consultants:  Interventional radiology  Procedures:   Antimicrobials:    Subjective: -No events overnight remains extremely cachectic, somnolent,  Objective: Vitals:   09/15/20 1924 09/15/20 2320 09/16/20 0335 09/16/20 0715  BP: (!) 147/62 (!) 169/66 (!) 137/54 (!) 121/48  Pulse: 91 99 90 86  Resp: '13 17 18 13  '$ Temp: 98.2 F (36.8 C) 98.1 F (36.7 C) 98.1 F (36.7 C) 98.8 F (37.1 C)  TempSrc: Axillary Axillary Axillary Oral  SpO2: 100% 100% 99% 100%    Intake/Output Summary (Last 24 hours) at 09/16/2020 1023 Last data filed at  09/16/2020 0955 Gross per 24 hour  Intake 4188.42 ml  Output 2520 ml  Net 1668.42 ml   There were no vitals filed for this visit.  Examination:  General exam: Somnolent elderly male, laying in bed, arousable, mumbling few words, ill appearing CVS: S1S2/RRR Lungs: Decreased breath sounds at the bases Abdomen: Soft, nontender, biliary drain noted, G-tube noted  Extremities: No edema, right groin femoral central line noted a Psychiatry: Flat affect, poor insight and judgment   Data Reviewed:   CBC: Recent Labs  Lab 09/15/20 0455 09/15/20 0759 09/16/20 0340  WBC 14.4*  --  13.4*  NEUTROABS 11.4*  --   --   HGB 10.3* 10.2* 10.2*  HCT 32.4* 30.0* 31.3*  MCV 97.0  --  94.3  PLT 541*  --  [id number]*   Basic Metabolic Panel: Recent Labs  Lab 09/15/20 1232 09/15/20 1802 09/15/20 2050 09/16/20 0030 09/16/20 0340  NA 154* 153* 152* 151* 148*  K 3.7 3.6 3.6 3.7 3.7  CL 122* 121* 120* 119* 117*  CO2 17* 19* 21* 20* 21*  GLUCOSE 101* 198* 190* 307* 353*  BUN 96* 88* 81* 77* 75*  CREATININE 4.22* 3.71* 3.42* 3.06* 2.76*  CALCIUM 8.5* 8.8* 8.7* 8.6* 8.5*   GFR: Estimated Creatinine Clearance: 22.2 mL/min (A) (by C-G formula based on SCr of 2.76 mg/dL (H)). Liver Function Tests: Recent Labs  Lab 09/15/20 0455 09/16/20 0340  AST 56* 56*  ALT 89* 71*  ALKPHOS 199* 174*  BILITOT 7.1* 5.4*  PROT 6.5 5.7*  ALBUMIN 2.6* 2.1*   No results for input(s): LIPASE, AMYLASE in the last 168 hours. Recent Labs  Lab 09/15/20 0455  AMMONIA 49*   Coagulation Profile: No results for input(s): INR, PROTIME in the last 168 hours. Cardiac Enzymes: No results for input(s): CKTOTAL, CKMB, CKMBINDEX, TROPONINI in the last 168 hours. BNP (last 3 results) No results for input(s): PROBNP in the last 8760 hours. HbA1C: No results for input(s): HGBA1C in the last 72 hours. CBG: Recent Labs  Lab 09/15/20 1307 09/15/20 1512 09/15/20 1824 09/15/20 2354 09/16/20 0608  GLUCAP 162* 194* 186*  207* 348*   Lipid Profile: No results for input(s): CHOL, HDL, LDLCALC, TRIG, CHOLHDL, LDLDIRECT in the last 72 hours. Thyroid Function Tests: No results for input(s): TSH, T4TOTAL, FREET4, T3FREE, THYROIDAB in the last 72 hours. Anemia Panel: No results for input(s): VITAMINB12, FOLATE, FERRITIN, TIBC, IRON, RETICCTPCT in the last 72 hours. Urine analysis:    Component Value Date/Time   COLORURINE AMBER (A) 09/15/2020 1204   APPEARANCEUR HAZY (A) 09/15/2020 1204   LABSPEC 1.013 09/15/2020 1204   PHURINE 5.0 09/15/2020 1204   GLUCOSEU >=500 (A) 09/15/2020 1204   HGBUR NEGATIVE 09/15/2020 [place] 09/15/2020 1204   KETONESUR NEGATIVE 09/15/2020 1204   PROTEINUR NEGATIVE 09/15/2020 1204   NITRITE NEGATIVE 09/15/2020 1204   LEUKOCYTESUR NEGATIVE 09/15/2020 1204   Sepsis Labs: '@LABRCNTIP'$ (procalcitonin:4,lacticidven:4)  ) Recent Results (from the past 240 hour(s))  SARS CORONAVIRUS 2 (TAT 6-24 HRS) Nasopharyngeal Nasopharyngeal Swab     Status: None   Collection Time: 09/15/20  8:05 AM   Specimen: Nasopharyngeal Swab  Result Value Ref  Range Status   SARS Coronavirus 2 NEGATIVE NEGATIVE Final    Comment: (NOTE) SARS-CoV-2 target nucleic acids are NOT DETECTED.  The SARS-CoV-2 RNA is generally detectable in upper and lower respiratory specimens during the acute phase of infection. Negative results do not preclude SARS-CoV-2 infection, do not rule out co-infections with other pathogens, and should not be used as the sole basis for treatment or other patient management decisions. Negative results must be combined with clinical observations, patient history, and epidemiological information. The expected result is Negative.  Fact Sheet for Patients: SugarRoll.be  Fact Sheet for Healthcare Providers: https://www.woods-mathews.com/  This test is not yet approved or cleared by the [place] FDA and  has been  authorized for detection and/or diagnosis of SARS-CoV-2 by FDA under an Emergency Use Authorization (EUA). This EUA will remain  in effect (meaning this test can be used) for the duration of the COVID-19 declaration under Se ction 564(b)(1) of the Act, 21 U.S.C. section 360bbb-3(b)(1), unless the authorization is terminated or revoked sooner.  Performed at [facility], [place] [address], [place], [place] [address]   MRSA Next Gen by PCR, Nasal     Status: None   Collection Time: 09/15/20  5:33 PM   Specimen: Nasal Mucosa; Nasal Swab  Result Value Ref Range Status   MRSA by PCR Next Gen NOT DETECTED NOT DETECTED Final    Comment: (NOTE) The GeneXpert MRSA Assay (FDA approved for NASAL specimens only), is one component of a comprehensive MRSA colonization surveillance program. It is not intended to diagnose MRSA infection nor to guide or monitor treatment for MRSA infections. Test performance is not FDA approved in patients less than 55 years old. Performed at [facility], [place] [address], [place], [place] [address]      Scheduled Meds:  chlorhexidine  15 mL Mouth Rinse BID   Chlorhexidine Gluconate Cloth  6 each Topical Daily   free water  200 mL Per Tube Q4H   haloperidol  1 mg Per Tube QHS   heparin  5,000 Units Subcutaneous Q8H   insulin aspart  0-9 Units Subcutaneous Q6H   insulin glargine-yfgn  10 Units Subcutaneous Daily   mouth rinse  15 mL Mouth Rinse q12n4p   pantoprazole sodium  40 mg Per Tube Daily   sodium chloride flush  3 mL Intravenous Q12H   vancomycin variable dose per unstable renal function (pharmacist dosing)   Does not apply See admin instructions   Continuous Infusions:  ceFEPime (MAXIPIME) IV 1 g (09/16/20 1011)   feeding supplement (OSMOLITE 1.5 CAL) 1,000 mL (09/15/20 1837)   metronidazole 500 mg (09/16/20 0338)     LOS: 1 day    Time spent: 60mn  PDomenic Polite MD [facility]   09/16/2020, 10:23 AM

## 2020-09-17 ENCOUNTER — Other Ambulatory Visit: Payer: Medicare HMO

## 2020-09-17 ENCOUNTER — Ambulatory Visit: Payer: Medicare HMO | Admitting: Oncology

## 2020-09-17 DIAGNOSIS — E1110 Type 2 diabetes mellitus with ketoacidosis without coma: Secondary | ICD-10-CM | POA: Diagnosis not present

## 2020-09-17 DIAGNOSIS — E1310 Other specified diabetes mellitus with ketoacidosis without coma: Secondary | ICD-10-CM | POA: Diagnosis not present

## 2020-09-17 LAB — COMPREHENSIVE METABOLIC PANEL
ALT: 61 U/L — ABNORMAL HIGH (ref 0–44)
AST: 51 U/L — ABNORMAL HIGH (ref 15–41)
Albumin: 2.2 g/dL — ABNORMAL LOW (ref 3.5–5.0)
Alkaline Phosphatase: 172 U/L — ABNORMAL HIGH (ref 38–126)
Anion gap: 10 (ref 5–15)
BUN: 50 mg/dL — ABNORMAL HIGH (ref 8–23)
CO2: 17 mmol/L — ABNORMAL LOW (ref 22–32)
Calcium: 8.4 mg/dL — ABNORMAL LOW (ref 8.9–10.3)
Chloride: 124 mmol/L — ABNORMAL HIGH (ref 98–111)
Creatinine, Ser: 1.65 mg/dL — ABNORMAL HIGH (ref 0.61–1.24)
GFR, Estimated: 46 mL/min — ABNORMAL LOW (ref >60)
Glucose, Bld: 414 mg/dL — ABNORMAL HIGH (ref 70–99)
Potassium: 3.5 mmol/L (ref 3.5–5.1)
Sodium: 151 mmol/L — ABNORMAL HIGH (ref 135–145)
Total Bilirubin: 5.7 mg/dL — ABNORMAL HIGH (ref 0.3–1.2)
Total Protein: 5.9 g/dL — ABNORMAL LOW (ref 6.5–8.1)

## 2020-09-17 LAB — GLUCOSE, CAPILLARY
Glucose-Capillary: 331 mg/dL — ABNORMAL HIGH (ref 70–99)
Glucose-Capillary: 326 mg/dL — ABNORMAL HIGH (ref 70–99)
Glucose-Capillary: 275 mg/dL — ABNORMAL HIGH (ref 70–99)

## 2020-09-17 LAB — CBC
HCT: 33.1 % — ABNORMAL LOW (ref 39.0–52.0)
Hemoglobin: 10.8 g/dL — ABNORMAL LOW (ref 13.0–17.0)
MCH: 31.1 pg (ref 26.0–34.0)
MCHC: 32.6 g/dL (ref 30.0–36.0)
MCV: 95.4 fL (ref 80.0–100.0)
Platelets: 427 10*3/uL — ABNORMAL HIGH (ref 150–400)
RBC: 3.47 MIL/uL — ABNORMAL LOW (ref 4.22–5.81)
RDW: 17.1 % — ABNORMAL HIGH (ref 11.5–15.5)
WBC: 14.5 10*3/uL — ABNORMAL HIGH (ref 4.0–10.5)
nRBC: 0 % (ref 0.0–0.2)

## 2020-09-17 MED ORDER — INSULIN GLARGINE-YFGN 100 UNIT/ML ~~LOC~~ SOLN
30.0000 [IU] | Freq: Two times a day (BID) | SUBCUTANEOUS | Status: DC
  Administered 2020-09-17 (×2): 30 [IU] via SUBCUTANEOUS
  Filled 2020-09-17 (×4): qty 0.3

## 2020-09-17 MED ORDER — SODIUM CHLORIDE 0.9% FLUSH
5.0000 mL | Freq: Every day | INTRAVENOUS | Status: DC
  Administered 2020-09-17 – 2020-09-19 (×3): 5 mL

## 2020-09-17 MED ORDER — DEXTROSE 5 % IV SOLN: INTRAVENOUS | Status: DC

## 2020-09-17 MED ORDER — OSMOLITE 1.5 CAL PO LIQD
1000.0000 mL | ORAL | Status: DC
  Administered 2020-09-17 (×2): 1000 mL

## 2020-09-17 MED ORDER — PROSOURCE TF PO LIQD
45.0000 mL | Freq: Three times a day (TID) | ORAL | Status: DC
  Administered 2020-09-17 – 2020-09-21 (×12): 45 mL
  Filled 2020-09-17 (×12): qty 45

## 2020-09-17 NOTE — Plan of Care (Signed)
  Problem: Education: Goal: Knowledge of General Education information will improve Description: Including pain rating scale, medication(s)/side effects and non-pharmacologic comfort measures Outcome: Not Progressing   Problem: Health Behavior/Discharge Planning: Goal: Ability to manage health-related needs will improve Outcome: Not Progressing   Problem: Clinical Measurements: Goal: Ability to maintain clinical measurements within normal limits will improve Outcome: Not Progressing Goal: Diagnostic test results will improve Outcome: Not Progressing   Problem: Activity: Goal: Risk for activity intolerance will decrease Outcome: Not Progressing   Problem: Coping: Goal: Level of anxiety will decrease Outcome: Not Progressing

## 2020-09-17 NOTE — Progress Notes (Signed)
Referring Physician(s): Dr. Tamala Julian, R.   Supervising Physician: Ruthann Cancer  Patient Status:  [facility] - In-pt  Chief Complaint:  Leakage around biliary drain  s/p biliary drain placement 08/16/20 by Dr. Pascal Lux with internalization 08/17/20 by Dr. Laurence Ferrari   Subjective:  Pt laying in bed not in acute distress, RN states that he was agitated this morning and required Haldol.  RN reports the dressing needed to be changed over weekend due to on going leakage.   Allergies: Patient has no known allergies.  Medications: Prior to Admission medications   Medication Sig Start Date End Date Taking? Authorizing Provider  amLODipine (NORVASC) 5 MG tablet Take 5 mg by mouth daily. 06/19/20  Yes [provider]  carvedilol (COREG) 6.25 MG tablet Take 1 tablet (6.25 mg total) by mouth 2 (two) times daily. 09/06/20  Yes Charlynne Cousins, MD  ferrous sulfate 325 (65 FE) MG tablet Take 325 mg by mouth daily with breakfast.   Yes [provider]  haloperidol (HALDOL) 1 MG tablet 1 tab po q HS, and every 4 hours as needed for agitation. Patient taking differently: Take 1 mg by mouth at bedtime. 09/13/20  Yes Dayton Scrape A, NP  irbesartan (AVAPRO) 300 MG tablet Take 1 tablet (300 mg total) by mouth daily. 09/06/20  Yes Charlynne Cousins, MD  JARDIANCE 25 MG TABS tablet Take 25 mg by mouth daily. 07/23/20  Yes [provider]  loperamide (IMODIUM) 2 MG capsule Take 2 mg by mouth as needed for diarrhea or loose stools.   Yes [provider]  metFORMIN (GLUCOPHAGE) 1000 MG tablet Take 1,000 mg by mouth 2 (two) times daily. 07/16/20  Yes [provider]  Nutritional Supplements (FEEDING SUPPLEMENT, OSMOLITE 1.5 CAL,) LIQD Place 1,000 mLs into feeding tube continuous. 09/06/20  Yes Charlynne Cousins, MD  olmesartan (BENICAR) 40 MG tablet Take 40 mg by mouth daily. 06/25/20  Yes [provider]  omeprazole (PRILOSEC) 40 MG capsule Take 40 mg by  mouth every morning. 03/01/20  Yes [provider]  ondansetron (ZOFRAN) 4 MG tablet Take 1 tablet (4 mg total) by mouth every 4 (four) hours as needed for nausea. 08/02/20  Yes Dayton Scrape A, NP  Oxycodone HCl 10 MG TABS Take 1 tablet (10 mg total) by mouth every 4 (four) hours as needed. Patient taking differently: Take 10 mg by mouth every 4 (four) hours as needed (pain). 08/02/20  Yes Melodye Ped, NP  Water For Irrigation, Sterile (FREE WATER) SOLN Place 200 mLs into feeding tube every 4 (four) hours. 09/06/20  Yes Charlynne Cousins, MD     Vital Signs: BP (!) 117/59 (BP Location: Right Arm)   Pulse 99   Temp 98.4 F (36.9 C) (Oral)   Resp 15   SpO2 100%   Physical Exam Vitals reviewed.  Constitutional:      General: He is not in acute distress.    Appearance: He is ill-appearing.  HENT:     Head: Normocephalic and atraumatic.  Eyes:     General: Scleral icterus present.  Pulmonary:     Effort: Pulmonary effort is normal.  Abdominal:     General: Abdomen is flat.     Palpations: Abdomen is soft.  Musculoskeletal:     Comments: Soft mittens on hands.   Skin:    General: Skin is warm and dry.     Comments: Positive RUQ drain to a gravity bag. Site with minimal bile leakage.  Suture and stat lock in place.  20 ml of bile colored fluid noted in the bag. Drain aspirates and flushes well, no leakage noted when drain was flushed.   Neurological:     Mental Status: He is disoriented.    Imaging: No results found.  Labs:  CBC: Recent Labs    09/05/20 0842 09/15/20 0455 09/15/20 0759 09/16/20 0340 09/17/20 0500  WBC 9.3 14.4*  --  13.4* 14.5*  HGB 9.8* 10.3* 10.2* 10.2* 10.8*  HCT 29.4* 32.4* 30.0* 31.3* 33.1*  PLT 359 541*  --  451* 427*    COAGS: Recent Labs    08/15/20 0452 08/16/20 0553 08/27/20 1015 09/02/20 0345  INR 1.5* 1.1 1.2 1.4*    BMP: Recent Labs    09/15/20 2050 09/16/20 0030 09/16/20 0340 09/17/20 0500  NA 152* 151*  148* 151*  K 3.6 3.7 3.7 3.5  CL 120* 119* 117* 124*  CO2 21* 20* 21* 17*  GLUCOSE 190* 307* 353* 414*  BUN 81* 77* 75* 50*  CALCIUM 8.7* 8.6* 8.5* 8.4*  CREATININE 3.42* 3.06* 2.76* 1.65*  GFRNONAA 19* 22* 25* 46*    LIVER FUNCTION TESTS: Recent Labs    09/05/20 0842 09/15/20 0455 09/16/20 0340 09/17/20 0500  BILITOT 7.7* 7.1* 5.4* 5.7*  AST 125* 56* 56* 51*  ALT 100* 89* 71* 61*  ALKPHOS 168* 199* 174* 172*  PROT 6.0* 6.5 5.7* 5.9*  ALBUMIN 2.2* 2.6* 2.1* 2.2*    Assessment and Plan:  65 yo male with biliary obstruction secondary to liver mass, s/p biliary drain placement 08/16/20 by Dr. Pascal Lux with internalization 08/17/20 by Dr. Laurence Ferrari. Patient failed capping trial, was discharged home with home hospice.  Patient presented to [facility] ED due to AMS and hyperglycemia. IR was consulted for leakage around the drain.   Drain intact, flushes and aspirates well - no leakage noted with flushing. Site with minimal bile leakage.  Puncture site unremarkable, no s/s of bleeding or infection.  OP 825 cc (620 cc yesterday)  VSS T bili 5.7 today (5.4 yesterday)   PLAN Discussed on going leakage from the drain site with Dr. Serafina Royals.  Recommends cholangiogram and possible exchange of the internal biliary drain to internal stent.  Informed attending physician above recommendation, palliative meeting for [place] discussion pending today. Will look out for note from palliative care team and see if family decide to proceed.  If family decide to proceed, the procedure will be done tomorrow pending IR scheduled.   Continue with flushing QD, output recording q shift and dressing changes as needed or at least 2-3 times a week.    Further treatment plan per [facility] Appreciate and agree with the plan.  IR to follow.    Electronically Signed: Tera Mater, PA-C 09/17/2020, 10:48 AM   I spent a total of 25 Minutes at the the patient's bedside AND on the patient's hospital floor or unit, greater  than 50% of which was counseling/coordinating care for biliary drain

## 2020-09-17 NOTE — Progress Notes (Signed)
   This pt is currently a pt under our services at home. He was recently admitted to [place] 09/12/20 and returned to hospital on 09/15/20. This hospitalization I a related GIP hospitalization. Our hospice diagnosis is malignant neoplasm of the pancreas.   His agitation has been ongoing the couple days that we had him as well and Dr. Hinton Rao had him on haloperidol at home for these occasions. His mentation would wax and wean as well with some visit he was alert and oriented and other visit he would be confused and think he was in the 1920's .   Pt has been taking oxycodone at home and on 09/12/20 still had 68 tablet in home. Just FYI.   Please reach out if you have questions and we can help. Webb Silversmith RN [phone]

## 2020-09-17 NOTE — Progress Notes (Signed)
Initial Nutrition Assessment  DOCUMENTATION CODES:  Severe malnutrition in context of chronic illness  INTERVENTION:  Obtain updated weight.  Advance TF to Osmolite 1.5 @ 55 ml/hr Add 45 ml Prosource TF TID, each provides 40 kcals and 11g protein Free water flushes of 200 ml every 4 hours (1000 ml) Provides 2100 kcals, 115g protein and 2005 ml H2O  NUTRITION DIAGNOSIS:  Severe Malnutrition related to chronic illness, cancer and cancer related treatments as evidenced by severe fat depletion, severe muscle depletion.  GOAL:  Patient will meet greater than or equal to 90% of their needs  MONITOR:  TF tolerance, Labs, Weight trends, I & O's  REASON FOR ASSESSMENT:  Malnutrition Screening Tool    ASSESSMENT:  65 y.o. 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 Recently hospitalized with malignant biliary obstruction due to pancreatic adenocarcinoma, underwent external biliary drain placement by IR on 7/21 after GI was unable to place stent with ERCP, cultures grew Candida and staph epidermidis which were treated with Eraxis and IV Unasyn, G-tube was placed due to gastric outlet obstruction in IR on 8/1. Admitted with DKA.  Current TF order: Osmolite 1.5 @ 40 ml/hr  Current TF provides 1440 kcal, 60 grams of protein, and 729 ml of free water (1929 ml water with free water flushes).  Pt not interested in interview with RD. Pt familiar to RD as RD saw him during his last admission   Per Epic, pt has lost ~34 lbs (20.5%) in the 1 week during prior admission. Unsure of the accuracy of this weight change. Pt does not have weight for this admission. RD to order.  Biliary tube:  8/20: 620 ml 8/21: 825 ml  Medications: reviewed; FWF 200 ml q 4 hrs, Haldol at bedtime and PRN (given twice today), SSI, Semglee, Protonix, D5 @  50 ml/hr  Labs: reviewed; Na 151 (H), CBG 224-455 (H) HbA1c: 5.3% (08/26/2020)  NUTRITION - FOCUSED PHYSICAL EXAM: Flowsheet Row Most Recent Value  Orbital Region Severe depletion  Upper Arm Region Moderate depletion  Thoracic and Lumbar Region Severe depletion  Buccal Region Severe depletion  Temple Region Severe depletion  Clavicle Bone Region Severe depletion  Clavicle and Acromion Bone Region Severe depletion  Scapular Bone Region Severe depletion  Dorsal Hand Severe depletion  Patellar Region Moderate depletion  Anterior Thigh Region Moderate depletion  Posterior Calf Region Moderate depletion  Edema (RD Assessment) Mild  [BLE]  Hair Reviewed  Eyes Reviewed  Mouth Reviewed  Skin Reviewed  Nails Reviewed   Diet Order:   Diet Order             Diet NPO time specified  Diet effective now                  EDUCATION NEEDS:  No education needs have been identified at this time  Skin:  Skin Assessment: Reviewed RN Assessment  Last BM:  09/16/20 - Type 6, medium  Height:  Ht Readings from Last 1 Encounters:  08/14/20 '5\' 10"'$  (1.778 m)   Weight:  Wt Readings from Last 1 Encounters:  09/11/20 59.6 kg   BMI:  There is no height or weight on file to calculate BMI.  Estimated Nutritional Needs:  Kcal:  2050-2250 Protein:  105-120 grams Fluid:  >2 L  Derrel Nip, RD, LDN (she/her/hers) Registered Dietitian I After-Hours/Weekend Pager # in Institute

## 2020-09-17 NOTE — Progress Notes (Signed)
PROGRESS NOTE    Jeff Jordan  [id number] DOB: 10-05-1955 DOA: 09/15/2020 PCP: Georganna Skeans, PA-C  Brief Narrative:Jeff Jordan is a 65 y.o. 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 -Recently hospitalized with malignant biliary obstruction due to pancreatic adenocarcinoma, underwent external biliary drain placement by IR on 7/21 after GI was unable to place stent with ERCP, cultures grew Candida and staph epidermidis which were treated with Eraxis and IV Unasyn, G-tube was placed due to gastric outlet obstruction in IR on 8/1. -He was made DNR, subsequently followed up with his oncologist, he was felt to be too frail for further active treatment and home hospice services were set up by Dr. Anabel Bene. -Brought to the ED due to worsening mental status, blood pressure noted to be low, labs were notable for BUN of 114, creatinine of 5.8 with a bicarb of 14 and blood glucose of 659. -He was started on IV fluids and an insulin drip -Also started on empiric antibiotics for leukocytosis, lactic acidosis -IR was consulted for biliary drain exchange  Assessment & Plan:   DKA type II metabolic acidosis with elevated anion gap secondary to DKA: - Acute.  Patient presented with glucose elevated up to 656 with CO2 14, anion gap 21, venous pH 7.199, and beta hydroxybutyrate 1.88.  The metabolic anion gap likely secondary to DKA and/acute kidney injury -DKA has corrected, CBGs remain elevated -Increase insulin dose, changed to twice daily -Tube feeds resumed  SIRS/lactic acidosis  -Likely secondary to DKA, AKI and dehydration  -Antibiotics discontinued, monitor  Hypotension -Transient, resolved, amlodipine Coreg irbesartan and Benicar on hold  Acute metabolic encephalopathy:  - Symptoms most likely multifactorial in the  setting of DKA, acute kidney injury secondary to dehydration, ammonia level mildly elevated at 49 -Improving, but with intermittent agitation   Acute renal failure secondary to severe dehydration: Patient presents with creatinine elevated up to 5.85 with BUN 114.  Previously creatinine was noted to be within normal limits at 0.71 just 10 days ago. -Continue IV fluids, change to D5 water in the setting of worsening hypernatremia   Pancreatic adenocarcinoma with biliary obstruction:  -During his recent hospitalization he had a external/internal biliary drain placed by IR.  Noted to have some leakage around the biliary drain.     -Seen by oncology after recent discharge and set up with home hospice -IR consulted for biliary drain -Palliative care consulted for goals of care, prognosis is very poor, family requests treatment for all known oncological issues, recommended consideration of comfort focused care -Continue oxycodone as needed per PEG   Severe protein calorie malnutrition dysphagia s/p G-tube: Albumin noted to be 2.6 and patient severely cachectic.   -Tube feeds resumed   DVT prophylaxis: Heparin Code Status: DNR Family Communication:no family at bedside, called and updated daughter yesterday Disposition Plan:  Status is: Inpatient  Remains inpatient appropriate because:Inpatient level of care appropriate due to severity of illness  Dispo: The patient is from: Home              Anticipated d/c is to: Home              Patient currently is not medically stable to d/c.   Difficult to place patient No   Consultants:  Interventional radiology  Procedures:   Antimicrobials:    Subjective: -Some agitation overnight, CBGs remain elevated, sodium worsening,  drowsy this morning, tolerating tube feeds  Objective: Vitals:   09/16/20 2000 09/16/20 2323 09/17/20 0307 09/17/20 0803  BP: (!) 122/50 (!) 141/64 (!) 114/57 (!) 117/59  Pulse: (!) 105 (!) 107 100 99  Resp: '20 19 18  15  '$ Temp: 98.5 F (36.9 C) 98.6 F (37 C)  98.4 F (36.9 C)  TempSrc: Axillary Axillary  Oral  SpO2: 99% 97% 98% 100%    Intake/Output Summary (Last 24 hours) at 09/17/2020 1115 Last data filed at 09/17/2020 [id number] Gross per 24 hour  Intake 3800.61 ml  Output 2775 ml  Net 1025.61 ml   There were no vitals filed for this visit.  Examination:  General exam: Elderly chronically ill male, appears much older than stated age, somnolent but arousable, oriented to self only, mumbles few words, ill-appearing CVS: S1-S2, regular rhythm, Lungs: Clear anteriorly, decreased breath sounds at the bases Abdomen: Soft, nontender, biliary drain noted, G-tube noted Extremities: No edema, right groin femoral central line noted Psych: Flat affect, poor insight and judgment    Data Reviewed:   CBC: Recent Labs  Lab 09/15/20 0455 09/15/20 0759 09/16/20 0340 09/17/20 0500  WBC 14.4*  --  13.4* 14.5*  NEUTROABS 11.4*  --   --   --   HGB 10.3* 10.2* 10.2* 10.8*  HCT 32.4* 30.0* 31.3* 33.1*  MCV 97.0  --  94.3 95.4  PLT 541*  --  451* [id number]*   Basic Metabolic Panel: Recent Labs  Lab 09/15/20 1802 09/15/20 2050 09/16/20 0030 09/16/20 0340 09/17/20 0500  NA 153* 152* 151* 148* 151*  K 3.6 3.6 3.7 3.7 3.5  CL 121* 120* 119* 117* 124*  CO2 19* 21* 20* 21* 17*  GLUCOSE 198* 190* 307* 353* 414*  BUN 88* 81* 77* 75* 50*  CREATININE 3.71* 3.42* 3.06* 2.76* 1.65*  CALCIUM 8.8* 8.7* 8.6* 8.5* 8.4*   GFR: Estimated Creatinine Clearance: 37.1 mL/min (A) (by C-G formula based on SCr of 1.65 mg/dL (H)). Liver Function Tests: Recent Labs  Lab 09/15/20 0455 09/16/20 0340 09/17/20 0500  AST 56* 56* 51*  ALT 89* 71* 61*  ALKPHOS 199* 174* 172*  BILITOT 7.1* 5.4* 5.7*  PROT 6.5 5.7* 5.9*  ALBUMIN 2.6* 2.1* 2.2*   No results for input(s): LIPASE, AMYLASE in the last 168 hours. Recent Labs  Lab 09/15/20 0455  AMMONIA 49*   Coagulation Profile: No results for input(s): INR, PROTIME in the  last 168 hours. Cardiac Enzymes: No results for input(s): CKTOTAL, CKMB, CKMBINDEX, TROPONINI in the last 168 hours. BNP (last 3 results) No results for input(s): PROBNP in the last 8760 hours. HbA1C: No results for input(s): HGBA1C in the last 72 hours. CBG: Recent Labs  Lab 09/16/20 1126 09/16/20 1256 09/16/20 1615 09/16/20 2346 09/17/20 0526  GLUCAP 455* 436* 364* 224* 331*   Lipid Profile: No results for input(s): CHOL, HDL, LDLCALC, TRIG, CHOLHDL, LDLDIRECT in the last 72 hours. Thyroid Function Tests: No results for input(s): TSH, T4TOTAL, FREET4, T3FREE, THYROIDAB in the last 72 hours. Anemia Panel: No results for input(s): VITAMINB12, FOLATE, FERRITIN, TIBC, IRON, RETICCTPCT in the last 72 hours. Urine analysis:    Component Value Date/Time   COLORURINE AMBER (A) 09/15/2020 1204   APPEARANCEUR HAZY (A) 09/15/2020 1204   LABSPEC 1.013 09/15/2020 1204   PHURINE 5.0 09/15/2020 1204   GLUCOSEU >=500 (A) 09/15/2020 [place] 09/15/2020 [place] 09/15/2020 [place] 09/15/2020 1204   PROTEINUR NEGATIVE 09/15/2020 1204  NITRITE NEGATIVE 09/15/2020 [place] 09/15/2020 1204   Sepsis Labs: '@LABRCNTIP'$ (procalcitonin:4,lacticidven:4)  ) Recent Results (from the past 240 hour(s))  SARS CORONAVIRUS 2 (TAT 6-24 HRS) Nasopharyngeal Nasopharyngeal Swab     Status: None   Collection Time: 09/15/20  8:05 AM   Specimen: Nasopharyngeal Swab  Result Value Ref Range Status   SARS Coronavirus 2 NEGATIVE NEGATIVE Final    Comment: (NOTE) SARS-CoV-2 target nucleic acids are NOT DETECTED.  The SARS-CoV-2 RNA is generally detectable in upper and lower respiratory specimens during the acute phase of infection. Negative results do not preclude SARS-CoV-2 infection, do not rule out co-infections with other pathogens, and should not be used as the sole basis for treatment or other patient management decisions. Negative  results must be combined with clinical observations, patient history, and epidemiological information. The expected result is Negative.  Fact Sheet for Patients: SugarRoll.be  Fact Sheet for Healthcare Providers: https://www.woods-mathews.com/  This test is not yet approved or cleared by the [place] FDA and  has been authorized for detection and/or diagnosis of SARS-CoV-2 by FDA under an Emergency Use Authorization (EUA). This EUA will remain  in effect (meaning this test can be used) for the duration of the COVID-19 declaration under Se ction 564(b)(1) of the Act, 21 U.S.C. section 360bbb-3(b)(1), unless the authorization is terminated or revoked sooner.  Performed at [facility], [place] [address], [place], [place] [address]   MRSA Next Gen by PCR, Nasal     Status: None   Collection Time: 09/15/20  5:33 PM   Specimen: Nasal Mucosa; Nasal Swab  Result Value Ref Range Status   MRSA by PCR Next Gen NOT DETECTED NOT DETECTED Final    Comment: (NOTE) The GeneXpert MRSA Assay (FDA approved for NASAL specimens only), is one component of a comprehensive MRSA colonization surveillance program. It is not intended to diagnose MRSA infection nor to guide or monitor treatment for MRSA infections. Test performance is not FDA approved in patients less than 15 years old. Performed at [facility], [place] [address] [place], [place] [address]   Blood culture (routine x 2)     Status: None (Preliminary result)   Collection Time: 09/15/20  6:02 PM   Specimen: BLOOD  Result Value Ref Range Status   Specimen Description BLOOD RIGHT ANTECUBITAL  Final   Special Requests   Final    BOTTLES DRAWN AEROBIC AND ANAEROBIC Blood Culture adequate volume   Culture   Final    NO GROWTH 2 DAYS Performed at [facility], [place] [address], [place], [place] [address]    Report Status PENDING  Incomplete  Blood culture (routine x 2)     Status:  None (Preliminary result)   Collection Time: 09/15/20  6:04 PM   Specimen: BLOOD  Result Value Ref Range Status   Specimen Description BLOOD LEFT ANTECUBITAL  Final   Special Requests   Final    BOTTLES DRAWN AEROBIC AND ANAEROBIC Blood Culture results may not be optimal due to an inadequate volume of blood received in culture bottles   Culture   Final    NO GROWTH 2 DAYS Performed at [facility], [place] [address], [place], [place] [address]    Report Status PENDING  Incomplete     Scheduled Meds:  chlorhexidine  15 mL Mouth Rinse BID   Chlorhexidine Gluconate Cloth  6 each Topical Daily   free water  200 mL Per Tube Q4H  haloperidol  1 mg Per Tube QHS   heparin  5,000 Units Subcutaneous Q8H   insulin aspart  0-9 Units Subcutaneous Q6H   insulin glargine-yfgn  30 Units Subcutaneous BID   mouth rinse  15 mL Mouth Rinse q12n4p   pantoprazole sodium  40 mg Per Tube Daily   sodium chloride flush  3 mL Intravenous Q12H   sodium chloride flush  5 mL Intracatheter Daily   Continuous Infusions:  dextrose 50 mL/hr at 09/17/20 0826   feeding supplement (OSMOLITE 1.5 CAL) 1,000 mL (09/17/20 0826)     LOS: 2 days    Time spent: 83mn  PDomenic Polite MD [facility]   09/17/2020, 11:15 AM

## 2020-09-17 NOTE — Progress Notes (Signed)
Pt pulling at leads, clothing, trying to exit bed. Staff tried reorienting and redirection. RN was able to instill Haladol in tube before pt swung at this RN and NT. RN was not able to administer any other medications (Protonix and Semglee). Pt is adamant that he does not want staff in the room. Pt redirected that he is here because his sugars were 700s. Pt states again that he does not want his insulin. Pt will sit calmly at the end of the bed, so long as staff are not within 4 to 5 feet of him. Provider Fanny Bien MD notified.   Will try repositioning in the bed and administration of remaining meds once Haldol kicks in.

## 2020-09-18 DIAGNOSIS — C259 Malignant neoplasm of pancreas, unspecified: Secondary | ICD-10-CM | POA: Diagnosis not present

## 2020-09-18 DIAGNOSIS — E1310 Other specified diabetes mellitus with ketoacidosis without coma: Secondary | ICD-10-CM | POA: Diagnosis not present

## 2020-09-18 DIAGNOSIS — K831 Obstruction of bile duct: Secondary | ICD-10-CM | POA: Diagnosis not present

## 2020-09-18 DIAGNOSIS — Z7189 Other specified counseling: Secondary | ICD-10-CM

## 2020-09-18 DIAGNOSIS — E1110 Type 2 diabetes mellitus with ketoacidosis without coma: Secondary | ICD-10-CM | POA: Diagnosis not present

## 2020-09-18 LAB — GLUCOSE, CAPILLARY
Glucose-Capillary: 221 mg/dL — ABNORMAL HIGH (ref 70–99)
Glucose-Capillary: 261 mg/dL — ABNORMAL HIGH (ref 70–99)
Glucose-Capillary: 283 mg/dL — ABNORMAL HIGH (ref 70–99)
Glucose-Capillary: 332 mg/dL — ABNORMAL HIGH (ref 70–99)

## 2020-09-18 LAB — COMPREHENSIVE METABOLIC PANEL
ALT: 52 U/L — ABNORMAL HIGH (ref 0–44)
AST: 45 U/L — ABNORMAL HIGH (ref 15–41)
Albumin: 2.1 g/dL — ABNORMAL LOW (ref 3.5–5.0)
Alkaline Phosphatase: 155 U/L — ABNORMAL HIGH (ref 38–126)
Anion gap: 6 (ref 5–15)
BUN: 33 mg/dL — ABNORMAL HIGH (ref 8–23)
CO2: 21 mmol/L — ABNORMAL LOW (ref 22–32)
Calcium: 8.6 mg/dL — ABNORMAL LOW (ref 8.9–10.3)
Chloride: 128 mmol/L — ABNORMAL HIGH (ref 98–111)
Creatinine, Ser: 1.14 mg/dL (ref 0.61–1.24)
GFR, Estimated: >60 mL/min (ref >60)
Glucose, Bld: 265 mg/dL — ABNORMAL HIGH (ref 70–99)
Potassium: 3.2 mmol/L — ABNORMAL LOW (ref 3.5–5.1)
Sodium: 155 mmol/L — ABNORMAL HIGH (ref 135–145)
Total Bilirubin: 5.1 mg/dL — ABNORMAL HIGH (ref 0.3–1.2)
Total Protein: 5.7 g/dL — ABNORMAL LOW (ref 6.5–8.1)

## 2020-09-18 LAB — CBC
HCT: 30.9 % — ABNORMAL LOW (ref 39.0–52.0)
Hemoglobin: 9.8 g/dL — ABNORMAL LOW (ref 13.0–17.0)
MCH: 30.7 pg (ref 26.0–34.0)
MCHC: 31.7 g/dL (ref 30.0–36.0)
MCV: 96.9 fL (ref 80.0–100.0)
Platelets: 358 10*3/uL (ref 150–400)
RBC: 3.19 MIL/uL — ABNORMAL LOW (ref 4.22–5.81)
RDW: 17 % — ABNORMAL HIGH (ref 11.5–15.5)
WBC: 13.3 10*3/uL — ABNORMAL HIGH (ref 4.0–10.5)
nRBC: 0 % (ref 0.0–0.2)

## 2020-09-18 MED ORDER — INSULIN GLARGINE-YFGN 100 UNIT/ML ~~LOC~~ SOLN
45.0000 [IU] | Freq: Two times a day (BID) | SUBCUTANEOUS | Status: DC
  Administered 2020-09-18 – 2020-09-20 (×5): 45 [IU] via SUBCUTANEOUS
  Filled 2020-09-18 (×7): qty 0.45

## 2020-09-18 MED ORDER — OSMOLITE 1.5 CAL PO LIQD
1000.0000 mL | ORAL | Status: DC
  Administered 2020-09-18: 1000 mL

## 2020-09-18 MED ORDER — FREE WATER
400.0000 mL | Status: DC
  Administered 2020-09-18 – 2020-09-21 (×19): 400 mL

## 2020-09-18 NOTE — TOC Progression Note (Addendum)
Transition of Care [facility]) - Progression Note    Patient Details  Name: Jeff Jordan MRN: [id number] Date of Birth: 1955/12/20  [facility] CM/SW Contact  Zenon Mayo, RN Phone Number: 09/18/2020, 12:51 PM  Clinical Narrative:    NCM spoke with [facility] with [place], she states this patient is active with them for home hospice.  We would just need to let them know when he is ready to be dc home.  Cheri with [facility] spoke with daughter yesterday and confirmed they still want patient to go home with hospice per [facility].    14:40 NCM spoke with Lorriane Shire the daughter, she states the plan is for patient to return home with hospice, she states he will need the safest transport home which is the ambulance.   She states she has been living with him since he was dc from the hospital.  NCM informed her that Palliative will be contacting her to set up a meeting with her. Informed her that her vm was full she states she will clear it out and that we can feel free to text her also.        Expected Discharge Plan and Services                                                 Social Determinants of Health (SDOH) Interventions    Readmission Risk Interventions No flowsheet data found.

## 2020-09-18 NOTE — Care Management Important Message (Signed)
Important Message  Patient Details  Name: Jeff Jordan MRN: [id number] Date of Birth: March 21, 1955   Medicare Important Message Given:  Yes     Davelyn Gwinn [address] 09/18/2020, 3:43 PM

## 2020-09-18 NOTE — Plan of Care (Signed)
  Problem: Education: Goal: Knowledge of General Education information will improve Description: Including pain rating scale, medication(s)/side effects and non-pharmacologic comfort measures Outcome: Not Progressing   Problem: Health Behavior/Discharge Planning: Goal: Ability to manage health-related needs will improve Outcome: Not Progressing   Problem: Clinical Measurements: Goal: Ability to maintain clinical measurements within normal limits will improve Outcome: Not Progressing Goal: Diagnostic test results will improve Outcome: Not Progressing   Problem: Activity: Goal: Risk for activity intolerance will decrease Outcome: Not Progressing   Problem: Coping: Goal: Level of anxiety will decrease Outcome: Not Progressing

## 2020-09-18 NOTE — Progress Notes (Signed)
PROGRESS NOTE    Jeff Jordan  [id number] DOB: [birth year] DOA: 09/15/2020 PCP: Georganna Skeans, PA-C  Brief Narrative:Jeff Jordan is a 65 y.o. 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 -Recently hospitalized with malignant biliary obstruction due to pancreatic adenocarcinoma, underwent external biliary drain placement by IR on 7/21 after GI was unable to place stent with ERCP, cultures grew Candida and staph epidermidis which were treated with Eraxis and IV Unasyn, G-tube was placed due to gastric outlet obstruction in IR on 8/1. -He was made DNR, subsequently followed up with his oncologist, he was felt to be too frail for further active treatment and home hospice services were set up by Dr. Anabel Bene. -Brought to the ED due to worsening mental status, blood pressure noted to be low, labs were notable for BUN of 114, creatinine of 5.8 with a bicarb of 14 and blood glucose of 659. -He was started on IV fluids and an insulin drip -Also started on empiric antibiotics for leukocytosis, lactic acidosis -IR was consulted for biliary drain exchange  Assessment & Plan:   DKA type II metabolic acidosis with elevated anion gap secondary to DKA: - Acute.  Patient presented with glucose elevated up to 656 with CO2 14, anion gap 21, venous pH 7.199, and beta hydroxybutyrate 1.88.  The metabolic anion gap likely secondary to DKA and/acute kidney injury -DKA has corrected, CBGs remain elevated -Tube feeds resumed, increase insulin glargine dose -Palliative team consulted for goals of care for today  SIRS/lactic acidosis  -Likely secondary to DKA, AKI and dehydration  -Antibiotics discontinued, monitor  Hypernatremia -Continue dextrose infusion, will not increase rate further on account of uncontrolled hyperglycemia -Increase  free water to 400 mL Q4  Hypotension -Transient, resolved, amlodipine Coreg irbesartan and Benicar on hold  Acute metabolic encephalopathy:  - Symptoms most likely multifactorial in the setting of DKA, acute kidney injury secondary to dehydration, ammonia level mildly elevated at 49 -Improving, but with intermittent agitation requiring Haldol   Acute renal failure secondary to severe dehydration:  -On admission creatinine elevated up to 5.85 with BUN 114.  Previously was normal -Continue IV fluids, changed to D5 water in the setting of worsening hypernatremia   Pancreatic adenocarcinoma with biliary obstruction:  -During his recent hospitalization he had a external/internal biliary drain placed by IR.  Noted to have some leakage around the biliary drain.     -Seen by oncology after recent discharge and set up with home hospice -Palliative care consulted for goals of care, prognosis is very poor, family requests treatment for all non oncological issues, made multiple attempts to reach daughter, recommended comfort/symptom focused care -Continue oxycodone PRN per PEG -Palliative team to discuss with family today -IR following as well, due to have biliary drain exchanged if goal is for continued active management after palliative meeting   Severe protein calorie malnutrition dysphagia s/p G-tube: Albumin noted to be 2.6 and patient severely cachectic.   -Tube feeds resumed   DVT prophylaxis: Heparin Code Status: DNR Family Communication:no family at bedside, called and updated daughter 8/21, made multiple attempts yesterday Disposition Plan:  Status is: Inpatient  Remains inpatient appropriate because:Inpatient level of care appropriate due to severity of illness  Dispo: The patient is from: Home              Anticipated d/c is to: Home  Patient currently is not medically stable to d/c.   Difficult to place patient No   Consultants:  Interventional  radiology Palliative medicine  Procedures:   Antimicrobials:    Subjective: -Agitation yesterday and overnight required Haldol, CBGs elevated but improving, sodium trending up, creatinine improving  Objective: Vitals:   09/18/20 0330 09/18/20 0437 09/18/20 0709 09/18/20 1049  BP: (!) 132/54  (!) 142/60 (!) 133/58  Pulse: (!) 103  96 92  Resp: '19  17 15  '$ Temp: 98.1 F (36.7 C)  98.1 F (36.7 C) 98.3 F (36.8 C)  TempSrc: Oral  Oral Axillary  SpO2: 100%  100% 99%  Weight:  59.5 kg      Intake/Output Summary (Last 24 hours) at 09/18/2020 1103 Last data filed at 09/18/2020 [id number] Gross per 24 hour  Intake 1555.95 ml  Output 1700 ml  Net -144.05 ml   Filed Weights   09/18/20 0437  Weight: 59.5 kg    Examination:  General exam: Elderly chronically ill male, appears much older than stated age, somnolent but arousable, oriented to self only, mumbles a few words, extremely ill-appearing CVS: S1-S2, regular rate rhythm Lungs: Decreased breath sounds to bases, poor air movement Abdomen: Soft, nontender, biliary drain noted, G-tube noted Extremities: No edema, right groin femoral central line noted Psych: Flat affect, poor insight and judgment    Data Reviewed:   CBC: Recent Labs  Lab 09/15/20 0455 09/15/20 0759 09/16/20 0340 09/17/20 0500 09/18/20 0530  WBC 14.4*  --  13.4* 14.5* 13.3*  NEUTROABS 11.4*  --   --   --   --   HGB 10.3* 10.2* 10.2* 10.8* 9.8*  HCT 32.4* 30.0* 31.3* 33.1* 30.9*  MCV 97.0  --  94.3 95.4 96.9  PLT 541*  --  451* 427* [id number]   Basic Metabolic Panel: Recent Labs  Lab 09/15/20 2050 09/16/20 0030 09/16/20 0340 09/17/20 0500 09/18/20 0530  NA 152* 151* 148* 151* 155*  K 3.6 3.7 3.7 3.5 3.2*  CL 120* 119* 117* 124* 128*  CO2 21* 20* 21* 17* 21*  GLUCOSE 190* 307* 353* 414* 265*  BUN 81* 77* 75* 50* 33*  CREATININE 3.42* 3.06* 2.76* 1.65* 1.14  CALCIUM 8.7* 8.6* 8.5* 8.4* 8.6*   GFR: Estimated Creatinine Clearance: 53.6 mL/min (by  C-G formula based on SCr of 1.14 mg/dL). Liver Function Tests: Recent Labs  Lab 09/15/20 0455 09/16/20 0340 09/17/20 0500 09/18/20 0530  AST 56* 56* 51* 45*  ALT 89* 71* 61* 52*  ALKPHOS 199* 174* 172* 155*  BILITOT 7.1* 5.4* 5.7* 5.1*  PROT 6.5 5.7* 5.9* 5.7*  ALBUMIN 2.6* 2.1* 2.2* 2.1*   No results for input(s): LIPASE, AMYLASE in the last 168 hours. Recent Labs  Lab 09/15/20 0455  AMMONIA 49*   Coagulation Profile: No results for input(s): INR, PROTIME in the last 168 hours. Cardiac Enzymes: No results for input(s): CKTOTAL, CKMB, CKMBINDEX, TROPONINI in the last 168 hours. BNP (last 3 results) No results for input(s): PROBNP in the last 8760 hours. HbA1C: No results for input(s): HGBA1C in the last 72 hours. CBG: Recent Labs  Lab 09/17/20 0526 09/17/20 1204 09/17/20 1631 09/18/20 0017 09/18/20 0536  GLUCAP 331* 326* 275* 332* 221*   Lipid Profile: No results for input(s): CHOL, HDL, LDLCALC, TRIG, CHOLHDL, LDLDIRECT in the last 72 hours. Thyroid Function Tests: No results for input(s): TSH, T4TOTAL, FREET4, T3FREE, THYROIDAB in the last 72 hours. Anemia Panel: No results for input(s): VITAMINB12, FOLATE, FERRITIN, TIBC, IRON, RETICCTPCT in  the last 72 hours. Urine analysis:    Component Value Date/Time   COLORURINE AMBER (A) 09/15/2020 1204   APPEARANCEUR HAZY (A) 09/15/2020 1204   LABSPEC 1.013 09/15/2020 1204   PHURINE 5.0 09/15/2020 1204   GLUCOSEU >=500 (A) 09/15/2020 1204   HGBUR NEGATIVE 09/15/2020 [place] 09/15/2020 1204   KETONESUR NEGATIVE 09/15/2020 1204   PROTEINUR NEGATIVE 09/15/2020 1204   NITRITE NEGATIVE 09/15/2020 1204   LEUKOCYTESUR NEGATIVE 09/15/2020 1204   Sepsis Labs: '@LABRCNTIP'$ (procalcitonin:4,lacticidven:4)  ) Recent Results (from the past 240 hour(s))  SARS CORONAVIRUS 2 (TAT 6-24 HRS) Nasopharyngeal Nasopharyngeal Swab     Status: None   Collection Time: 09/15/20  8:05 AM   Specimen: Nasopharyngeal  Swab  Result Value Ref Range Status   SARS Coronavirus 2 NEGATIVE NEGATIVE Final    Comment: (NOTE) SARS-CoV-2 target nucleic acids are NOT DETECTED.  The SARS-CoV-2 RNA is generally detectable in upper and lower respiratory specimens during the acute phase of infection. Negative results do not preclude SARS-CoV-2 infection, do not rule out co-infections with other pathogens, and should not be used as the sole basis for treatment or other patient management decisions. Negative results must be combined with clinical observations, patient history, and epidemiological information. The expected result is Negative.  Fact Sheet for Patients: SugarRoll.be  Fact Sheet for Healthcare Providers: https://www.woods-mathews.com/  This test is not yet approved or cleared by the [place] FDA and  has been authorized for detection and/or diagnosis of SARS-CoV-2 by FDA under an Emergency Use Authorization (EUA). This EUA will remain  in effect (meaning this test can be used) for the duration of the COVID-19 declaration under Se ction 564(b)(1) of the Act, 21 U.S.C. section 360bbb-3(b)(1), unless the authorization is terminated or revoked sooner.  Performed at [facility], [place] [address], [place], [place] [address]   MRSA Next Gen by PCR, Nasal     Status: None   Collection Time: 09/15/20  5:33 PM   Specimen: Nasal Mucosa; Nasal Swab  Result Value Ref Range Status   MRSA by PCR Next Gen NOT DETECTED NOT DETECTED Final    Comment: (NOTE) The GeneXpert MRSA Assay (FDA approved for NASAL specimens only), is one component of a comprehensive MRSA colonization surveillance program. It is not intended to diagnose MRSA infection nor to guide or monitor treatment for MRSA infections. Test performance is not FDA approved in patients less than 28 years old. Performed at [facility], [place] [address], [place], [place] [address]   Blood culture  (routine x 2)     Status: None (Preliminary result)   Collection Time: 09/15/20  6:02 PM   Specimen: BLOOD  Result Value Ref Range Status   Specimen Description BLOOD RIGHT ANTECUBITAL  Final   Special Requests   Final    BOTTLES DRAWN AEROBIC AND ANAEROBIC Blood Culture adequate volume   Culture   Final    NO GROWTH 3 DAYS Performed at [facility], [place] [address] [place], [place] [address]    Report Status PENDING  Incomplete  Blood culture (routine x 2)     Status: None (Preliminary result)   Collection Time: 09/15/20  6:04 PM   Specimen: BLOOD  Result Value Ref Range Status   Specimen Description BLOOD LEFT ANTECUBITAL  Final   Special Requests   Final    BOTTLES DRAWN AEROBIC AND ANAEROBIC Blood Culture results may not be optimal due to an inadequate volume of blood received  in culture bottles   Culture   Final    NO GROWTH 3 DAYS Performed at [facility], [place] [address] [place], [place] [address]    Report Status PENDING  Incomplete     Scheduled Meds:  chlorhexidine  15 mL Mouth Rinse BID   Chlorhexidine Gluconate Cloth  6 each Topical Daily   feeding supplement (PROSource TF)  45 mL Per Tube TID   free water  400 mL Per Tube Q4H   haloperidol  1 mg Per Tube QHS   heparin  5,000 Units Subcutaneous Q8H   insulin aspart  0-9 Units Subcutaneous Q6H   insulin glargine-yfgn  45 Units Subcutaneous BID   mouth rinse  15 mL Mouth Rinse q12n4p   pantoprazole sodium  40 mg Per Tube Daily   sodium chloride flush  3 mL Intravenous Q12H   sodium chloride flush  5 mL Intracatheter Daily   Continuous Infusions:  dextrose 50 mL/hr at 09/18/20 0431   feeding supplement (OSMOLITE 1.5 CAL)       LOS: 3 days    Time spent: 80mn  PDomenic Polite MD [facility]   09/18/2020, 11:03 AM

## 2020-09-18 NOTE — Consult Note (Signed)
Consultation Note Date: 09/18/2020   Patient Name: Jeff Jordan  DOB: 1955/09/09  MRN: [id number]  Age / Sex: 65 y.o., male  PCP: Nolen Mu Referring Physician: Domenic Polite, MD  Reason for Consultation: Establishing goals of care "pancreatic cancer coming in with DKA, acute renal failure"  HPI/Patient Profile: 65 y.o. male with recently diagnosed pancreatic adenocarcinoma with obstruction of the bilary tree and past medical history of hypertension and diabetes mellitus type 2. He presented to [facility] emergency department  09/15/2020 with altered mental status.  He was recently hospitalized with malignant biliary obstruction due to pancreatic adenocarcinoma and underwent external biliary drain placement by IR on 7/21 after GI was unable to place stent with ERCP. On 8/1, PEG placed due to gastric outlet obstruction.  In the ED, labs notable for BUN of 114, creatinine of 5.8 with a bicarb of 14 and blood glucose of 659. He was started on an insulin drip and admitted to [facility] with DKA type II.   Of note, patient was recently enrolled in home services with [facility]. Palliative Medicine was asked to get involved to help clarify goals of care.   Clinical Assessment and Goals of Care: I have reviewed medical records including EPIC notes, labs and imaging, and examined the patient. He is confused. He is able to verbalize simple words and phrases at times. He has bilateral soft mittens on due to pulling at tubes/lines and some agitated behavior (swinging) toward staff.   I spoke with his daughter Lorriane Shire by phone  to discuss diagnosis, prognosis, [place], EOL wishes, disposition, and options. I introduced Palliative Medicine as specialized medical care for people living with serious illness. It focuses on providing relief from the symptoms and stress of a serious illness.   We discussed a brief life review  of the patient. He lives in [place]. He is divorced. He has 1 daughter Lorriane Shire). 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 As far as functional and nutritional status, he has had a significant decline in a few short weeks. He is non-ambulatory and dependent for all ADLs. He is tube feed dependent. He previously lived alone, but Lorriane Shire has been staying with him since his cancer diagnosis.   We discussed his current illness and what it means in the larger context of his ongoing co-morbidities.  Natural disease trajectory of advanced cancer was discussed. Lorriane Shire reports that they were told by the outpatient oncologist in [place] that his cancer was stage 4 and his prognosis was likely only 1 month. She understands the cancer will ultimately "take him",  but does not "want him to die from something other than cancer".   I attempted to elicit values and goals of care important to the patient. Lorriane Shire seems to indicate she wants her father to spend his final days at home.   The difference between full scope medical intervention and comfort care was considered. Discussed that a comfort path means stopping full scope medical interventions, allowing a natural course to occur.  Discussed that the goal is comfort and dignity rather than cure/prolonging life. Encouraged Lorriane Shire to keep in mind the concept of quality of life - at this point she feels her father still has good quality of life to some extent.   I did express concern to Lorriane Shire that Mr. Wilmot's acute medical issues were related to his underlying cancer diagnosis and would continue to be recurrent issues. Continuing the cycle of "treat the treatable" will lead to rehospitalization and we may reach a point where it is not possible for Mr. Hubby to spend his final days at home.   Discussed the importance of continued conversation with family and the medical providers regarding overall plan of care and treatment options, ensuring decisions  are within the context of the patients values and GOCs.  Primary decision maker: Daughter Kendarius Whisenhunt    SUMMARY OF RECOMMENDATIONS   Continue current interventions, treat the treatable Goal of care is to return home with hospice PMT will continue to follow  Code Status/Advance Care Planning: DNR  Symptom Management:  Agree with Haldol 1 mg daily at bedtime Agree with Haldol 1 mg every 4 hours as needed for agitation  Palliative Prophylaxis:  Oral Care and Turn Reposition  Psycho-social/Spiritual:  Created space and opportunity for family to express thoughts and feelings regarding patient's current medical situation.  Emotional support provided   Prognosis:  Weeks to months  Discharge Planning: Home with Hospice      Primary Diagnoses: Present on Admission:  DKA, type 2 ([place])  SIRS (systemic inflammatory response syndrome) (HCC)  Acute renal failure (ARF) (HCC)  Transient hypotension  Pancreatic adenocarcinoma (HCC)  Biliary obstruction  Thrombocytosis  Transaminitis  Hyperbilirubinemia  Severe protein-calorie malnutrition Altamease Oiler: less than 60% of standard weight) ([place])   I have reviewed the medical record, interviewed the patient and family, and examined the patient. The following aspects are pertinent.  Past Medical History:  Diagnosis Date   Biliary obstruction    Essential hypertension    Gastroesophageal reflux    Hepatic steatosis    Mild hyperlipidemia    Type 2 diabetes mellitus ([place])      Family History  Family history unknown: Yes   Scheduled Meds:  chlorhexidine  15 mL Mouth Rinse BID   Chlorhexidine Gluconate Cloth  6 each Topical Daily   feeding supplement (PROSource TF)  45 mL Per Tube TID   free water  400 mL Per Tube Q4H   haloperidol  1 mg Per Tube QHS   heparin  5,000 Units Subcutaneous Q8H   insulin aspart  0-9 Units Subcutaneous Q6H   insulin glargine-yfgn  45 Units Subcutaneous BID   mouth rinse  15 mL Mouth Rinse q12n4p    pantoprazole sodium  40 mg Per Tube Daily   sodium chloride flush  3 mL Intravenous Q12H   sodium chloride flush  5 mL Intracatheter Daily   Continuous Infusions:  dextrose 50 mL/hr at 09/18/20 1725   feeding supplement (OSMOLITE 1.5 CAL) 1,000 mL (09/18/20 1140)   PRN Meds:.acetaminophen **OR** acetaminophen, albuterol, dextrose, haloperidol **AND** haloperidol, loperamide, ondansetron **OR** ondansetron (ZOFRAN) IV, oxyCODONE   No Known Allergies Review of Systems  Unable to perform ROS: Other   Physical Exam Vitals reviewed.  Constitutional:      General: He is not in acute distress.    Appearance: He is ill-appearing.  Eyes:     Comments: Yellow eyes  Pulmonary:     Effort: Pulmonary effort is normal.  Abdominal:  Comments: Tube feeds via PEG  Neurological:     Mental Status: He is easily aroused. He is confused.     Motor: Weakness present.     Comments: Oriented to self only    Vital Signs: BP (!) 127/56 (BP Location: Right Arm)   Pulse 94   Temp 98.2 F (36.8 C) (Axillary)   Resp 20   Wt 59.5 kg   SpO2 100%   BMI 18.82 kg/m  Pain Scale: Faces POSS *See Group Information*: 1-Acceptable,Awake and alert Pain Score: Asleep   SpO2: SpO2: 100 % O2 Device:SpO2: 100 % O2 Flow Rate: .   IO: Intake/output summary:  Intake/Output Summary (Last 24 hours) at 09/18/2020 1808 Last data filed at 09/18/2020 1757 Gross per 24 hour  Intake 1183.81 ml  Output 2505 ml  Net -1321.19 ml    LBM: Last BM Date: 09/17/20 Baseline Weight: Weight: 59.5 kg Most recent weight: Weight: 59.5 kg      Palliative Assessment/Data: PPS 30%      Time In: 1740 Time Out: 1830 Time Total: 50 minutes Greater than 50%  of this time was spent counseling and coordinating care related to the above assessment and plan.  Signed by: Lavena Bullion, NP   Please contact Palliative Medicine Team phone at [phone] for questions and concerns.  For individual provider: See  Shea Evans

## 2020-09-18 NOTE — TOC Initial Note (Signed)
Transition of Care [facility]) - Initial/Assessment Note    Patient Details  Name: Jeff Jordan MRN: [id number] Date of Birth: [birth year]  [facility] CM/SW Contact:    Zenon Mayo, RN Phone Number: 09/18/2020, 2:46 PM  Clinical Narrative:                 NCM spoke with [facility] with [place], she states this patient is active with them for home hospice.  We would just need to let them know when he is ready to be dc home.  Cheri with [facility] spoke with daughter yesterday and confirmed they still want patient to go home with hospice per [facility].     14:40 NCM spoke with Lorriane Shire the daughter, she states the plan is for patient to return home with hospice, she states he will need the safest transport home which is the ambulance.   She states she has been living with him since he was dc from the hospital.  NCM informed her that Palliative will be contacting her to set up a meeting with her. Informed her that her vm was full she states she will clear it out and that we can feel free to text her also.    Expected Discharge Plan: Home w Hospice Care Barriers to Discharge: Continued Medical Work up   Patient Goals and CMS Choice Patient states their goals for this hospitalization and ongoing recovery are:: home with hospice      Expected Discharge Plan and Services Expected Discharge Plan: [place] In-house Referral: Hospice / Palliative Care Discharge Planning Services: CM Consult Post Acute Care Choice: Hospice Living arrangements for the past 2 months: Single Family Home                   DME Agency: NA       HH Arranged: RN [place] Agency: [place] Date [place]: 09/18/20 Time HH Agency Contacted: 1445 Representative spoke with at [place]: [place]  Prior Living Arrangements/Services Living arrangements for the past 2 months: [place] Lives with:: Adult Children Patient language  and need for interpreter reviewed:: Yes        Need for Family Participation in Patient Care: Yes (Comment) Care giver support system in place?: Yes (comment) Current home services: Hospice Criminal Activity/Legal Involvement Pertinent to Current Situation/Hospitalization: No - Comment as needed  Activities of Daily Living      Permission Sought/Granted                  Emotional Assessment Appearance:: Appears stated age Attitude/Demeanor/Rapport: Engaged Affect (typically observed): Appropriate Orientation: : Oriented to Self, Oriented to Place Alcohol / Substance Use: Not Applicable Psych Involvement: No (comment)  Admission diagnosis:  DKA, type 2 ([place]) [E11.10] Malignant neoplasm of pancreas, unspecified location of malignancy ([place]) [C25.9] Diabetic ketoacidosis without coma associated with other specified diabetes mellitus ([place]) [E13.10] Patient Active Problem List   Diagnosis Date Noted   DKA, type 2 ([place]) 09/15/2020   SIRS (systemic inflammatory response syndrome) ([place]) 09/15/2020   Acute renal failure (ARF) ([place]) 09/15/2020   Transient hypotension 09/15/2020   Thrombocytosis 09/15/2020   Transaminitis 09/15/2020   Hyperbilirubinemia 09/15/2020   S/P percutaneous endoscopic gastrostomy (PEG) tube placement ([place]) 09/15/2020   Cholangitis    Severe protein-calorie malnutrition Altamease Oiler: less than 60% of standard weight) ([place]) 08/21/2020   Non-intractable vomiting    Pancreatic adenocarcinoma ([place]) 07/27/2020   Pancreatic mass  07/24/2020   Biliary obstruction 07/13/2020   RUQ pain 06/26/2020   Hepatic steatosis 03/30/2020   Type 2 diabetes mellitus with diabetic polyneuropathy, without long-term current use of insulin ([place]) 03/23/2020   Essential hypertension 03/23/2020   Gastroesophageal reflux disease without esophagitis 03/23/2020   Mixed hyperlipidemia 03/23/2020   PCP:  Georganna Skeans, PA-C Pharmacy[facility]   [id number]: [phone]: [phone]    Social Determinants of Health (SDOH) Interventions    Readmission Risk Interventions Readmission Risk Prevention Plan 09/18/2020  Transportation Screening Complete  HRI or Home Care Consult Complete  Social Work Consult for RRio LajasPlanning/Counseling Complete  Medication Review (Press photographer Complete  Some recent data might be hidden

## 2020-09-19 DIAGNOSIS — N179 Acute kidney failure, unspecified: Secondary | ICD-10-CM | POA: Diagnosis not present

## 2020-09-19 DIAGNOSIS — E43 Unspecified severe protein-calorie malnutrition: Secondary | ICD-10-CM | POA: Diagnosis not present

## 2020-09-19 DIAGNOSIS — K831 Obstruction of bile duct: Secondary | ICD-10-CM | POA: Diagnosis not present

## 2020-09-19 DIAGNOSIS — E876 Hypokalemia: Secondary | ICD-10-CM

## 2020-09-19 DIAGNOSIS — Z7189 Other specified counseling: Secondary | ICD-10-CM

## 2020-09-19 DIAGNOSIS — C259 Malignant neoplasm of pancreas, unspecified: Secondary | ICD-10-CM | POA: Diagnosis not present

## 2020-09-19 DIAGNOSIS — E1110 Type 2 diabetes mellitus with ketoacidosis without coma: Secondary | ICD-10-CM | POA: Diagnosis not present

## 2020-09-19 LAB — BASIC METABOLIC PANEL
Anion gap: 6 (ref 5–15)
BUN: 27 mg/dL — ABNORMAL HIGH (ref 8–23)
CO2: 21 mmol/L — ABNORMAL LOW (ref 22–32)
Calcium: 8.5 mg/dL — ABNORMAL LOW (ref 8.9–10.3)
Chloride: 126 mmol/L — ABNORMAL HIGH (ref 98–111)
Creatinine, Ser: 0.92 mg/dL (ref 0.61–1.24)
GFR, Estimated: >60 mL/min (ref >60)
Glucose, Bld: 89 mg/dL (ref 70–99)
Potassium: 3 mmol/L — ABNORMAL LOW (ref 3.5–5.1)
Sodium: 153 mmol/L — ABNORMAL HIGH (ref 135–145)

## 2020-09-19 LAB — CBC
HCT: 32.1 % — ABNORMAL LOW (ref 39.0–52.0)
Hemoglobin: 10 g/dL — ABNORMAL LOW (ref 13.0–17.0)
MCH: 30.7 pg (ref 26.0–34.0)
MCHC: 31.2 g/dL (ref 30.0–36.0)
MCV: 98.5 fL (ref 80.0–100.0)
Platelets: 289 10*3/uL (ref 150–400)
RBC: 3.26 MIL/uL — ABNORMAL LOW (ref 4.22–5.81)
RDW: 17.1 % — ABNORMAL HIGH (ref 11.5–15.5)
WBC: 14.2 10*3/uL — ABNORMAL HIGH (ref 4.0–10.5)
nRBC: 0 % (ref 0.0–0.2)

## 2020-09-19 LAB — GLUCOSE, CAPILLARY
Glucose-Capillary: 102 mg/dL — ABNORMAL HIGH (ref 70–99)
Glucose-Capillary: 121 mg/dL — ABNORMAL HIGH (ref 70–99)
Glucose-Capillary: 187 mg/dL — ABNORMAL HIGH (ref 70–99)
Glucose-Capillary: 187 mg/dL — ABNORMAL HIGH (ref 70–99)
Glucose-Capillary: 73 mg/dL (ref 70–99)

## 2020-09-19 MED ORDER — OSMOLITE 1.5 CAL PO LIQD
1000.0000 mL | ORAL | Status: DC
  Administered 2020-09-19 – 2020-09-20 (×3): 1000 mL
  Filled 2020-09-19 (×3): qty 1000

## 2020-09-19 MED ORDER — POTASSIUM CHLORIDE 10 MEQ/100ML IV SOLN
10.0000 meq | INTRAVENOUS | Status: AC
  Administered 2020-09-19 (×4): 10 meq via INTRAVENOUS
  Filled 2020-09-19 (×4): qty 100

## 2020-09-19 NOTE — Progress Notes (Signed)
Pt taken off NPO to clear liquids. Pt drank several large gulps of water. Several minutes later, approximately 20ccs of emesis came back up. [place], mucousy.

## 2020-09-19 NOTE — Progress Notes (Signed)
Inpatient Diabetes Program Recommendations  AACE/ADA: New Consensus Statement on Inpatient Glycemic Control   Target Ranges:  Prepandial:   less than 140 mg/dL      Peak postprandial:   less than 180 mg/dL (1-2 hours)      Critically ill patients:  140 - 180 mg/dL   Results for GIRARD, DOBIS (MRN [id number]) as of 09/19/2020 10:00  Ref. Range 09/18/2020 05:36 09/18/2020 11:25 09/18/2020 18:35 09/19/2020 00:05 09/19/2020 05:00  Glucose-Capillary Latest Ref Range: 70 - 99 mg/dL 221 (H) 283 (H) 261 (H) 121 (H) 73    Review of Glycemic Control  Current orders for Inpatient glycemic control: Semglee 45 units BID, Novolog 0-9 units Q6H; Osmolite @ 55 ml/hr  Inpatient Diabetes Program Recommendations:    Insulin: Please consider decreasing Semglee to 40 units BID.  Thanks, Barnie Alderman, RN, MSN, CDE Diabetes Coordinator Inpatient Diabetes Program [phone] (Team Pager from 8am to 5pm)'

## 2020-09-19 NOTE — Progress Notes (Addendum)
PROGRESS NOTE    Jeff Jordan  [id number] DOB: 05-25-1955 DOA: 09/15/2020 PCP: Georganna Skeans, PA-C   Brief Narrative:  Jeff Jordan is a 65 y.o. 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  In the previous admission patient was treated for Candida and staph epidermidis which were treated with Eraxis and IV Unasyn, G-tube was placed due to gastric outlet obstruction in IR on 8/1.  Patient was then considered for home hospice due to frailty of the patient.  Patient was on home hospice when he started having worsening mental status low blood pressure and was noted to have acute kidney injury with DKA.  Patient was then admitted to hospital for further evaluation and treatment.  He was given IV fluids insulin drip and initially was on empiric antibiotic which was subsequently discontinued.  IR was then consulted for biliary drain exchange due to some leakage.  Assessment & Plan:   DKA type II with anion gap metabolic acidosis  Initial blood glucose level of up to 656 with CO2 14, anion gap 21, venous pH 7.199, and beta hydroxybutyrate 1.88.  DKA has corrected at this time.  On sliding scale insulin and glargine.  On tube feeds.  We will continue to monitor closely.   Significant hypokalemia.  We will continue to replenish through IV.  Check levels in a.m.  Check magnesium levels in a.m.  Systemic inflammatory response syndrome/lactic acidosis. Likely multifactorial from DKA, AKI and dehydration.  Not on antibiotics.  Hypernatremia Likely secondary to lack of free water.  Continue dextrose and water flushes.    Hypotension Resolved.  Antihypertensives including amlodipine Coreg irbesartan and Benicar on hold.  Acute metabolic encephalopathy:  Likely multifactorial from DKA dehydration.  Requiring  intermittent Haldol.  Continue to treat underlying issues.    Acute renal failure secondary to severe dehydration:  Resolved after IV fluids.  Currently at 0.9.  Creatinine on presentation was 5.8.     Pancreatic adenocarcinoma with biliary obstruction and obstructive jaundice.:  Continue biliary drain.  Likely need extensive due to leakage.  We will monitor CMP.  Ethics/ goals of care.  Palliative care on board.  Prognosis is poor but family requesting treatment for known oncological issues.  Currently n.p.o. for IR cholangiogram exchange.  Severe protein calorie malnutrition/dysphagia s/p G-tube:  Continue tube feeds  DVT prophylaxis: Heparin subcu  Code Status: DNR  Family Communication: None today.  Disposition Plan:  Status is: Inpatient  Remains inpatient appropriate because:Inpatient level of care appropriate due to severity of illness, electrolyte imbalance,  Dispo: The patient is from: Home              Anticipated d/c is to: Home with hospice              Patient currently is not medically stable to d/c.   Difficult to place patient No  Consultants:  Interventional radiology Palliative medicine  Procedures:  None  Antimicrobials:  None  Subjective: Today, patient was seen and examined at bedside.  Patient appears to be more comfortable today.  Denies overt pain, nausea or vomiting but has mild chest discomfort.  Objective: Vitals:   09/18/20 1619 09/18/20 1940 09/18/20 2346 09/19/20 0404  BP: (!) 127/56 (!) 124/54 (!) 147/64 (!) 130/57  Pulse: 94 94 99 91  Resp: '20 14 16 15  '$ Temp: 98.2  F (36.8 C) 98.7 F (37.1 C) 98.8 F (37.1 C) 97.7 F (36.5 C)  TempSrc: Axillary Oral Axillary Oral  SpO2: 100% 99% 98% 100%  Weight:    62 kg    Intake/Output Summary (Last 24 hours) at 09/19/2020 0709 Last data filed at 09/19/2020 0500 Gross per 24 hour  Intake 6702.61 ml  Output 2355 ml  Net 4347.61 ml    Filed Weights   09/18/20 0437 09/19/20 0404   Weight: 59.5 kg 62 kg    Physical examination: General: Chronically ill, older than stated age,  not in obvious distress HENT icterus noted.  Oral mucosa is moist.  Chest: Decreased breath sounds bilaterally.   CVS: S1 &S2 heard. No murmur.  Regular rate and rhythm. Abdomen: Soft, nontender, nondistended.  G-tube in place, biliary drain in place. Extremities: No cyanosis, clubbing or edema.  Peripheral pulses are palpable.  Right groin femoral central line Psych: Alert, awake and communicative, flat affect CNS:  No cranial nerve deficits.  Power equal in all extremities.   Skin: Warm and dry.  No rashes noted.   Data Review  Following data were reviewed.  CBC: Recent Labs  Lab 09/15/20 0455 09/15/20 0759 09/16/20 0340 09/17/20 0500 09/18/20 0530 09/19/20 0500  WBC 14.4*  --  13.4* 14.5* 13.3* 14.2*  NEUTROABS 11.4*  --   --   --   --   --   HGB 10.3* 10.2* 10.2* 10.8* 9.8* 10.0*  HCT 32.4* 30.0* 31.3* 33.1* 30.9* 32.1*  MCV 97.0  --  94.3 95.4 96.9 98.5  PLT 541*  --  451* 427* 358 [id number]    Basic Metabolic Panel: Recent Labs  Lab 09/16/20 0030 09/16/20 0340 09/17/20 0500 09/18/20 0530 09/19/20 0500  NA 151* 148* 151* 155* 153*  K 3.7 3.7 3.5 3.2* 3.0*  CL 119* 117* 124* 128* 126*  CO2 20* 21* 17* 21* 21*  GLUCOSE 307* 353* 414* 265* 89  BUN 77* 75* 50* 33* 27*  CREATININE 3.06* 2.76* 1.65* 1.14 0.92  CALCIUM 8.6* 8.5* 8.4* 8.6* 8.5*    GFR: Estimated Creatinine Clearance: 68.3 mL/min (by C-G formula based on SCr of 0.92 mg/dL). Liver Function Tests: Recent Labs  Lab 09/15/20 0455 09/16/20 0340 09/17/20 0500 09/18/20 0530  AST 56* 56* 51* 45*  ALT 89* 71* 61* 52*  ALKPHOS 199* 174* 172* 155*  BILITOT 7.1* 5.4* 5.7* 5.1*  PROT 6.5 5.7* 5.9* 5.7*  ALBUMIN 2.6* 2.1* 2.2* 2.1*    No results for input(s): LIPASE, AMYLASE in the last 168 hours. Recent Labs  Lab 09/15/20 0455  AMMONIA 49*    Coagulation Profile: No results for input(s): INR,  PROTIME in the last 168 hours. Cardiac Enzymes: No results for input(s): CKTOTAL, CKMB, CKMBINDEX, TROPONINI in the last 168 hours. BNP (last 3 results) No results for input(s): PROBNP in the last 8760 hours. HbA1C: No results for input(s): HGBA1C in the last 72 hours. CBG: Recent Labs  Lab 09/18/20 0536 09/18/20 1125 09/18/20 1835 09/19/20 0005 09/19/20 0500  GLUCAP 221* 283* 261* 121* 73    Lipid Profile: No results for input(s): CHOL, HDL, LDLCALC, TRIG, CHOLHDL, LDLDIRECT in the last 72 hours. Thyroid Function Tests: No results for input(s): TSH, T4TOTAL, FREET4, T3FREE, THYROIDAB in the last 72 hours. Anemia Panel: No results for input(s): VITAMINB12, FOLATE, FERRITIN, TIBC, IRON, RETICCTPCT in the last 72 hours. Urine analysis:    Component Value Date/Time   COLORURINE AMBER (A) 09/15/2020 1204   APPEARANCEUR HAZY (A) 09/15/2020  1204   LABSPEC 1.013 09/15/2020 1204   PHURINE 5.0 09/15/2020 1204   GLUCOSEU >=500 (A) 09/15/2020 1204   HGBUR NEGATIVE 09/15/2020 [place] 09/15/2020 [place] 09/15/2020 [place] 09/15/2020 1204   NITRITE NEGATIVE 09/15/2020 1204   LEUKOCYTESUR NEGATIVE 09/15/2020 1204   Sepsis Labs: '@LABRCNTIP'$ (procalcitonin:4,lacticidven:4)  ) Recent Results (from the past 240 hour(s))  SARS CORONAVIRUS 2 (TAT 6-24 HRS) Nasopharyngeal Nasopharyngeal Swab     Status: None   Collection Time: 09/15/20  8:05 AM   Specimen: Nasopharyngeal Swab  Result Value Ref Range Status   SARS Coronavirus 2 NEGATIVE NEGATIVE Final    Comment: (NOTE) SARS-CoV-2 target nucleic acids are NOT DETECTED.  The SARS-CoV-2 RNA is generally detectable in upper and lower respiratory specimens during the acute phase of infection. Negative results do not preclude SARS-CoV-2 infection, do not rule out co-infections with other pathogens, and should not be used as the sole basis for treatment or other patient management  decisions. Negative results must be combined with clinical observations, patient history, and epidemiological information. The expected result is Negative.  Fact Sheet for Patients: SugarRoll.be  Fact Sheet for Healthcare Providers: https://www.woods-mathews.com/  This test is not yet approved or cleared by the [place] FDA and  has been authorized for detection and/or diagnosis of SARS-CoV-2 by FDA under an Emergency Use Authorization (EUA). This EUA will remain  in effect (meaning this test can be used) for the duration of the COVID-19 declaration under Se ction 564(b)(1) of the Act, 21 U.S.C. section 360bbb-3(b)(1), unless the authorization is terminated or revoked sooner.  Performed at [facility], [place] [address] [place], [place] [address]   MRSA Next Gen by PCR, Nasal     Status: None   Collection Time: 09/15/20  5:33 PM   Specimen: Nasal Mucosa; Nasal Swab  Result Value Ref Range Status   MRSA by PCR Next Gen NOT DETECTED NOT DETECTED Final    Comment: (NOTE) The GeneXpert MRSA Assay (FDA approved for NASAL specimens only), is one component of a comprehensive MRSA colonization surveillance program. It is not intended to diagnose MRSA infection nor to guide or monitor treatment for MRSA infections. Test performance is not FDA approved in patients less than 74 years old. Performed at [facility], [place] [address] [place], [place] [address]   Blood culture (routine x 2)     Status: None (Preliminary result)   Collection Time: 09/15/20  6:02 PM   Specimen: BLOOD  Result Value Ref Range Status   Specimen Description BLOOD RIGHT ANTECUBITAL  Final   Special Requests   Final    BOTTLES DRAWN AEROBIC AND ANAEROBIC Blood Culture adequate volume   Culture   Final    NO GROWTH 3 DAYS Performed at [facility], [place] [address], [place], [place] [address]    Report Status PENDING  Incomplete  Blood culture  (routine x 2)     Status: None (Preliminary result)   Collection Time: 09/15/20  6:04 PM   Specimen: BLOOD  Result Value Ref Range Status   Specimen Description BLOOD LEFT ANTECUBITAL  Final   Special Requests   Final    BOTTLES DRAWN AEROBIC AND ANAEROBIC Blood Culture results may not be optimal due to an inadequate volume of blood received in culture bottles   Culture   Final    NO GROWTH 3 DAYS Performed at [facility], [place]  [address], [place], [place] [address]    Report Status PENDING  Incomplete     Scheduled Meds:  chlorhexidine  15 mL Mouth Rinse BID   Chlorhexidine Gluconate Cloth  6 each Topical Daily   feeding supplement (PROSource TF)  45 mL Per Tube TID   free water  400 mL Per Tube Q4H   haloperidol  1 mg Per Tube QHS   heparin  5,000 Units Subcutaneous Q8H   insulin aspart  0-9 Units Subcutaneous Q6H   insulin glargine-yfgn  45 Units Subcutaneous BID   mouth rinse  15 mL Mouth Rinse q12n4p   pantoprazole sodium  40 mg Per Tube Daily   sodium chloride flush  3 mL Intravenous Q12H   sodium chloride flush  5 mL Intracatheter Daily   Continuous Infusions:  dextrose 50 mL/hr at 09/19/20 0500   feeding supplement (OSMOLITE 1.5 CAL) 1,000 mL (09/18/20 1140)     LOS: 4 days    Flora Lipps, MD [facility] 09/19/2020, 7:09 AM

## 2020-09-19 NOTE — Progress Notes (Addendum)
Daily Progress Note   Patient Name: Jeff Jordan       Date: 09/19/2020 DOB: May 24, 1955  Age: 65 y.o. MRN#: [id number] Attending Physician: Jeff Lipps, MD Primary Care Physician: Jeff Jordan Admit Date: 09/15/2020  Reason for Consultation/Follow-up: goals of care  Subjective: I went to see patient at bedside. Daughter Jeff Jordan is present in the room. Patient's mental status is improved today compared to yesterday.   I spent time reviewing with [facility] patient's hospital course and current medical condition. Discussed that his blood sugars have improved, the acute kidney injury has improved, and his mental status has improved.   Discussed at length that most of his acute issues are related to the underlying cancer. Explained that we will likely not be able to resolve all of his issues, for example the hypernatremia. If the goal of care continues as "treat the treatable", Jeff Jordan would spend additional time in the hospital rather than at home with family. Discussed shifting the goal of care to medically optimize him as much as possible, with the understanding that all issues may not be resolved in the setting of advanced cancer. Jeff Jordan verbalizes understanding and is agreeable to this plan.   Additional discussion was had around the issue of artificial feeding. Provided education that despite artifical feeding, Jeff Jordan body was in a state of malnutrition secondary to advanced cancer. Explained that artificial feeding was not without risks, and there would likely come a point when his body would no longer tolerate tube feeds. Also discussed option to let him start a diet, and option to run tubes feeds only at night in the home setting.   Bedside RN joined me in the room to discuss  the issue of replacing the biliary drain. RN explained that leakage around the drain is minimal and that drain replacement would involve an invasive procedure as well as follow-up appointments to check the drain. Jeff Jordan agrees that replacing the drain would place unnecessary burden on Jeff Jordan.   Length of Stay: 4    Physical Exam Vitals reviewed.  Constitutional:      General: He is not in acute distress.    Appearance: He is ill-appearing.  Eyes:     Comments: Yellow eyes  Pulmonary:     Effort: Pulmonary effort is normal.  Abdominal:  Comments: PEG with tube feeds Biliary drain  Neurological:     Mental Status: He is alert.            Vital Signs: BP 130/64   Pulse 94   Temp 97.8 F (36.6 C) (Axillary)   Resp 18   Wt 62 kg   SpO2 100%   BMI 19.61 kg/m  SpO2: SpO2: 100 % O2 Device: O2 Device: Room Air O2 Flow Rate:      LBM: Last BM Date: 09/19/20 Baseline Weight: Weight: 59.5 kg Most recent weight: Weight: 62 kg       Palliative Assessment/Data: PPS 30%       Palliative Care Assessment & Plan   HPI/Patient Profile: 65 y.o. male with recently diagnosed pancreatic adenocarcinoma with obstruction of the bilary tree and past medical history of hypertension and diabetes mellitus type 2. He presented to [facility] emergency department  09/15/2020 with altered mental status.  He was recently hospitalized with malignant biliary obstruction due to pancreatic adenocarcinoma and underwent external biliary drain placement by IR on 7/21 after GI was unable to place stent with ERCP. On 8/1, PEG placed due to gastric outlet obstruction.  In the ED, labs notable for BUN of 114, creatinine of 5.8 with a bicarb of 14 and blood glucose of 659. He was started on an insulin drip and admitted to [facility] with DKA type II.    Of note, patient was recently enrolled in home services with [place]. Palliative Medicine was asked to get involved to help clarify goals of care.      Assessment: - DKA type II - hypernatremia - AKI - hypotension - acute metabolic encephalopathy - pancreatic adenocarcinoma with biliary obstruction - severe protein calorie malnutrition  Recommendations/Plan: Goal of care is to medically optimize enough to return home with hospice Daughter declines replacement of biliary drain Would recommend returning to bolus tube feeds at home  Please call [phone] if we can be of additional assistance or need to actively re-engage with this patient and family   Goals of Care and Additional Recommendations: Limitations on Scope of Treatment: No Surgical Procedures and No Tracheostomy  Code Status: DNR/DNI  Prognosis:  Weeks to months  Discharge Planning: Home with Hospice    Thank you for allowing the Palliative Medicine Team to assist in the care of this patient.   Total Time 65 minutes Prolonged Time Billed  yes       Greater than 50%  of this time was spent counseling and coordinating care related to the above assessment and plan.  Jeff Bullion, NP  Please contact Palliative Medicine Team phone at [phone] for questions and concerns.

## 2020-09-19 NOTE — Plan of Care (Signed)
  Problem: Education: Goal: Knowledge of General Education information will improve Description: Including pain rating scale, medication(s)/side effects and non-pharmacologic comfort measures Outcome: Not Progressing   Problem: Health Behavior/Discharge Planning: Goal: Ability to manage health-related needs will improve Outcome: Not Progressing   Problem: Clinical Measurements: Goal: Ability to maintain clinical measurements within normal limits will improve Outcome: Not Progressing Goal: Diagnostic test results will improve Outcome: Not Progressing   Problem: Activity: Goal: Risk for activity intolerance will decrease Outcome: Not Progressing   Problem: Coping: Goal: Level of anxiety will decrease Outcome: Not Progressing

## 2020-09-19 NOTE — Progress Notes (Signed)
Nutrition Follow-up  DOCUMENTATION CODES:   Severe malnutrition in context of chronic illness  INTERVENTION:   Continue tube feeds via PEG: - Osmolite 1.5 @ 55 ml/hr (1320 ml/day) - ProSource TF 45 ml TID - Free water flushes per MD, currently 400 ml q 4 hours  Tube feeding regimen provides 2100 kcal, 116 grams of protein, and 1006 ml of H2O.  Total free water with flushes: 3406 ml  NUTRITION DIAGNOSIS:   Severe Malnutrition related to chronic illness, cancer and cancer related treatments as evidenced by severe fat depletion, severe muscle depletion.  Ongoing, being addressed via TF  GOAL:   Patient will meet greater than or equal to 90% of their needs  Met via TF  MONITOR:   TF tolerance, Labs, Weight trends, I & O's  REASON FOR ASSESSMENT:   Malnutrition Screening Tool    ASSESSMENT:   65 y.o. 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 Recently hospitalized with malignant biliary obstruction due to pancreatic adenocarcinoma, underwent external biliary drain placement by IR on 7/21 after GI was unable to place stent with ERCP, cultures grew Candida and staph epidermidis which were treated with Eraxis and IV Unasyn, G-tube was placed due to gastric outlet obstruction in IR on 8/1. Admitted with DKA.  Palliative Medicine team following for [place]. Per note from yesterday, plan is to continue current interventions and treat the treatable. Goal is for pt to d/c home with hospice. Pt remains NPO with tube feeds infusing via PEG.  Per review of notes, IR had attempted to add J-arm to PEG on date of initial placement (8/01) but was unsuccessful due to inability to cannulate the pylorus.  Pt tolerating current tube feeds and having bowel movements. Spoke with RN who noted some dried drainage that resembled tube  feeding around pt's PEG when dressing was changed this morning. Per RN, PEG does not appear to be actively leaking.  Current TF: Osmolite 1.5 @ 55 ml/hr, ProSource TF 45 ml TID, free water flushes of 400 ml q 4 hours  Medications reviewed and include: SSI q 6 hours, semglee 45 units BID, protonix IVF: D5 @ 50 ml/hr  Labs reviewed: sodium 153, potassium 3.0, BUN 27, elevated LFTs, hemoglobin 10.0 CBG's: 73-261 x 24 hours  UOP: 1600 ml x 24 hours Biliary drain: 755 ml x 24 hours I/O's: +9.0 L since admit  Diet Order:   Diet Order             Diet NPO time specified  Diet effective now                   EDUCATION NEEDS:   No education needs have been identified at this time  Skin:  Skin Assessment: Reviewed RN Assessment  Last BM:  8//24/22 large type 6  Height:   Ht Readings from Last 1 Encounters:  08/14/20 $RemoveB'5\' 10"'keSUFANb$  (1.778 m)    Weight:   Wt Readings from Last 1 Encounters:  09/19/20 62 kg    BMI:  Body mass index is 19.61 kg/m.  Estimated Nutritional Needs:   Kcal:  2050-2250  Protein:  105-120 grams  Fluid:  >2 L    Gustavus Bryant, MS, RD, LDN Inpatient Clinical Dietitian Please see AMiON for contact information.

## 2020-09-20 DIAGNOSIS — E1110 Type 2 diabetes mellitus with ketoacidosis without coma: Secondary | ICD-10-CM | POA: Diagnosis not present

## 2020-09-20 DIAGNOSIS — N179 Acute kidney failure, unspecified: Secondary | ICD-10-CM | POA: Diagnosis not present

## 2020-09-20 DIAGNOSIS — E876 Hypokalemia: Secondary | ICD-10-CM | POA: Diagnosis not present

## 2020-09-20 DIAGNOSIS — K831 Obstruction of bile duct: Secondary | ICD-10-CM | POA: Diagnosis not present

## 2020-09-20 LAB — GLUCOSE, CAPILLARY
Glucose-Capillary: 101 mg/dL — ABNORMAL HIGH (ref 70–99)
Glucose-Capillary: 125 mg/dL — ABNORMAL HIGH (ref 70–99)
Glucose-Capillary: 137 mg/dL — ABNORMAL HIGH (ref 70–99)
Glucose-Capillary: 62 mg/dL — ABNORMAL LOW (ref 70–99)
Glucose-Capillary: 64 mg/dL — ABNORMAL LOW (ref 70–99)
Glucose-Capillary: 67 mg/dL — ABNORMAL LOW (ref 70–99)
Glucose-Capillary: 80 mg/dL (ref 70–99)

## 2020-09-20 LAB — CBC
HCT: 29.9 % — ABNORMAL LOW (ref 39.0–52.0)
Hemoglobin: 9.6 g/dL — ABNORMAL LOW (ref 13.0–17.0)
MCH: 30.7 pg (ref 26.0–34.0)
MCHC: 32.1 g/dL (ref 30.0–36.0)
MCV: 95.5 fL (ref 80.0–100.0)
Platelets: 234 10*3/uL (ref 150–400)
RBC: 3.13 MIL/uL — ABNORMAL LOW (ref 4.22–5.81)
RDW: 17.2 % — ABNORMAL HIGH (ref 11.5–15.5)
WBC: 14.5 10*3/uL — ABNORMAL HIGH (ref 4.0–10.5)
nRBC: 0 % (ref 0.0–0.2)

## 2020-09-20 LAB — COMPREHENSIVE METABOLIC PANEL
ALT: 40 U/L (ref 0–44)
AST: 41 U/L (ref 15–41)
Albumin: 2 g/dL — ABNORMAL LOW (ref 3.5–5.0)
Alkaline Phosphatase: 131 U/L — ABNORMAL HIGH (ref 38–126)
Anion gap: 6 (ref 5–15)
BUN: 18 mg/dL (ref 8–23)
CO2: 21 mmol/L — ABNORMAL LOW (ref 22–32)
Calcium: 8.2 mg/dL — ABNORMAL LOW (ref 8.9–10.3)
Chloride: 116 mmol/L — ABNORMAL HIGH (ref 98–111)
Creatinine, Ser: 0.72 mg/dL (ref 0.61–1.24)
GFR, Estimated: >60 mL/min (ref >60)
Glucose, Bld: 96 mg/dL (ref 70–99)
Potassium: 3.3 mmol/L — ABNORMAL LOW (ref 3.5–5.1)
Sodium: 143 mmol/L (ref 135–145)
Total Bilirubin: 4.4 mg/dL — ABNORMAL HIGH (ref 0.3–1.2)
Total Protein: 5.5 g/dL — ABNORMAL LOW (ref 6.5–8.1)

## 2020-09-20 LAB — CULTURE, BLOOD (ROUTINE X 2)
Culture: NO GROWTH
Culture: NO GROWTH
Special Requests: ADEQUATE

## 2020-09-20 LAB — MAGNESIUM: Magnesium: 1.9 mg/dL (ref 1.7–2.4)

## 2020-09-20 MED ORDER — POTASSIUM CHLORIDE 10 MEQ/100ML IV SOLN
10.0000 meq | INTRAVENOUS | Status: AC
  Administered 2020-09-20 (×3): 10 meq via INTRAVENOUS
  Filled 2020-09-20 (×3): qty 100

## 2020-09-20 MED ORDER — SODIUM CHLORIDE 0.9 % IV SOLN
12.5000 mg | Freq: Once | INTRAVENOUS | Status: DC | PRN
  Filled 2020-09-20: qty 0.5

## 2020-09-20 MED ORDER — SODIUM CHLORIDE 0.9 % IV SOLN
12.5000 mg | Freq: Four times a day (QID) | INTRAVENOUS | Status: DC | PRN
  Administered 2020-09-20: 12.5 mg via INTRAVENOUS
  Filled 2020-09-20: qty 0.5

## 2020-09-20 NOTE — Plan of Care (Signed)
  Problem: Nutrition: Goal: Adequate nutrition will be maintained Outcome: Progressing   Problem: Coping: Goal: Level of anxiety will decrease Outcome: Progressing   Problem: Elimination: Goal: Will not experience complications related to urinary retention Outcome: Progressing   Problem: Pain Managment: Goal: General experience of comfort will improve Outcome: Progressing   

## 2020-09-20 NOTE — Progress Notes (Signed)
Inpatient Diabetes Program Recommendations  AACE/ADA: New Consensus Statement on Inpatient Glycemic Control   Target Ranges:  Prepandial:   less than 140 mg/dL      Peak postprandial:   less than 180 mg/dL (1-2 hours)      Critically ill patients:  140 - 180 mg/dL   Results for MASSEY, WULF (MRN [id number]) as of 09/20/2020 10:16  Ref. Range 09/19/2020 05:00 09/19/2020 12:05 09/19/2020 18:30 09/19/2020 21:26 09/20/2020 00:19 09/20/2020 05:33 09/20/2020 05:37  Glucose-Capillary Latest Ref Range: 70 - 99 mg/dL 73 102 (H) 187 (H) 187 (H) 137 (H) 62 (L) 64 (L)   Review of Glycemic Control  Current orders for Inpatient glycemic control: Semglee 45 units BID, Novolog 0-9 units Q6H; Osmolite @ 55 ml/hr   Inpatient Diabetes Program Recommendations:     Insulin: Please consider decreasing Semglee to 40 units BID.   Thanks, Barnie Alderman, RN, MSN, CDE Diabetes Coordinator Inpatient Diabetes Program [phone] (Team Pager from 8am to 5pm)'

## 2020-09-20 NOTE — TOC Transition Note (Signed)
Transition of Care [facility]) - CM/SW Discharge Note   Patient Details  Name: Jeff Jordan MRN: [id number] Date of Birth: 1955/12/30  Transition of Care [facility]) CM/SW Contact:  Zenon Mayo, RN Phone Number: 09/20/2020, 4:35 PM   Clinical Narrative:    Patient is for dc tomorrow home with hospice, will need ambulance transport address has been confirmed.  [facility] is supplying the hospital bed and the [facility] and transport chair.  Patient was on bolus feeds pta , hospice feels this would be beneficial if this is continued at dc.     Final next level of care: Home w Hospice Care Barriers to Discharge: Continued Medical Work up   Patient Goals and CMS Choice Patient states their goals for this hospitalization and ongoing recovery are:: home with hospice      Discharge Placement                       Discharge Plan and Services In-house Referral: Hospice / Palliative Care Discharge Planning Services: CM Consult Post Acute Care Choice: Hospice          DME Arranged:  ([facility] to supply DME) DME Agency: NA       HH Arranged: RN [place] Agency: [place] Date [place]: 09/18/20 Time [place]: [place] Representative spoke with at [place]: [place] ([place]) Interventions     Readmission Risk Interventions Readmission Risk Prevention Plan 09/18/2020  Transportation Screening Complete  HRI or Home Care Consult Complete  Social Work Consult for [place] Planning/Counseling Complete  Medication Review Press photographer) Complete  Some recent data might be hidden

## 2020-09-20 NOTE — Progress Notes (Signed)
Pt evening cbg reading 70 , MD notified

## 2020-09-20 NOTE — Progress Notes (Signed)
    This pt is active with [place]. I have spoke to the pt's daughter and she continues to want the pt to return home under her care with hospice services. She is willing to learn to give insulin and check BS at home. She is willing to learn to give meds via G-Tube. our nurses will be able to do teaching in home to assist with the daughter being successful in these activities. Prior to the hospitalization the pt was up moving at times around home with assistance. He is much weaker now and daughter is able to recognize this. I have ordered a low lying hospital bed with full rails with over bed table and BSC along with transport chair for pt to have at home. The pt was on bolus feeds prior to coming into the hospital and this would be optimal if they could continue these at discharge.  The daughter reported they were doing 3 cans two times a day.    Please reach out with questions or concerns. Thank you for assistance in helping up care for this pt.   Webb Silversmith RN BSN [facility] [phone]

## 2020-09-20 NOTE — Progress Notes (Signed)
PROGRESS NOTE    Jeff Jordan  [id number] DOB: 11/05/1955 DOA: 09/15/2020 PCP: Georganna Skeans, PA-C   Brief Narrative:  Jeff Jordan is a 65 y.o. 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  In the previous admission patient was treated for Candida and staph epidermidis which were treated with Eraxis and IV Unasyn, G-tube was placed due to gastric outlet obstruction in IR on 8/1.  Patient was then considered for home hospice due to frailty of the patient.  Patient was on home hospice when he started having worsening mental status low blood pressure and was noted to have acute kidney injury with DKA.  Patient was then admitted to hospital for further evaluation and treatment.  He was given IV fluids insulin drip and initially was on empiric antibiotic which was subsequently discontinued.    Assessment & Plan:   DKA type II with anion gap metabolic acidosis  Initial blood glucose level of up to 656 with CO2 14, anion gap 21, venous pH 7.199, and beta hydroxybutyrate 1.88.  DKA has corrected at this time.  On sliding scale insulin and glargine.  On tube feeds.  We will continue to monitor closely.  Vomiting and diarrhea.  On tube feeds.  Will provide symptomatic care.   Significant hypokalemia.  Improved with replacement.  Potassium was 3.3 today.  We will continue to replenish.  Magnesium 1.9.  Systemic inflammatory response syndrome/lactic acidosis. Likely multifactorial from DKA, AKI and dehydration.  Not on antibiotics.  Hypernatremia Likely secondary to lack of free water.  Improved with dextrose and water flushes.  Sodium of 143 today.  Will discontinue dextrose.  Hypotension Resolved.  Antihypertensives including amlodipine Coreg irbesartan and Benicar on hold.  Will resume amlodipine and  Coreg for now and then closely monitor  Acute metabolic encephalopathy:  Likely multifactorial from DKA dehydration.  Improved  Acute renal failure secondary to severe dehydration:  Resolved after IV fluids.  Currently at 0.7.  Creatinine on presentation was 5.8.     Pancreatic adenocarcinoma with biliary obstruction and obstructive jaundice.:  Continue biliary drain.  Family has no plans for exchanging it at this time.  Ethics/ goals of care.  Palliative care on board.  Plan to proceed with current level of care no escalation.  Severe protein calorie malnutrition/dysphagia s/p G-tube:  Continue tube feeds  DVT prophylaxis: Heparin subcu  Code Status: DNR  Family Communication: None today.  Disposition Plan:  Status is: Inpatient  Remains inpatient appropriate because:Inpatient level of care appropriate due to severity of illness, electrolyte imbalance, PEG tube feeding,  Dispo: The patient is from: Home              Anticipated d/c is to: Home with hospice              Patient currently is not medically stable to d/c.  Likely home tomorrow.   Difficult to place patient No  Consultants:  Interventional radiology Palliative medicine  Procedures:  None  Antimicrobials:  None  Subjective: Today, patient was seen and examined at bedside.  Nursing staff reported that he did have few episodes of vomiting and continues to have loose stools.   Objective: Vitals:   09/19/20 1608 09/19/20 1950 09/19/20 2233 09/20/20 0313  BP: 130/64 (!) 144/54 (!) 148/63 (!) 156/65  Pulse: 94 92 100 99  Resp:  $'18 20 18 20  'G$ Temp: 97.8 F (36.6 C) 98.4 F (36.9 C) 98.3 F (36.8 C) 99.4 F (37.4 C)  TempSrc: Axillary Oral Oral Oral  SpO2: 100% 99% 100% 100%  Weight:    62.9 kg    Intake/Output Summary (Last 24 hours) at 09/20/2020 1101 Last data filed at 09/20/2020 0448 Gross per 24 hour  Intake 2290.93 ml  Output 2075 ml  Net 215.93 ml    Filed Weights   09/18/20 0437 09/19/20  0404 09/20/20 0313  Weight: 59.5 kg 62 kg 62.9 kg    Physical examination: General: Chronically ill, not in obvious distress HENT icterus noted.  Oral mucosa is moist.  Chest: Decreased breath sounds bilaterally.  No obvious crackles or wheezes CVS: S1 &S2 heard. No murmur.  Regular rate and rhythm. Abdomen: Soft, nontender, nondistended.  G-tube in place, biliary drain in place. Extremities: No cyanosis, clubbing or edema.  Peripheral pulses are palpable.  Right groin central line in place Psych: Alert, awake and communicative, flat affect CNS:  No cranial nerve deficits.  Power equal in all extremities.   Skin: Warm and dry.  No rashes noted.   Data Review  Following data were reviewed.  CBC: Recent Labs  Lab 09/15/20 0455 09/15/20 0759 09/16/20 0340 09/17/20 0500 09/18/20 0530 09/19/20 0500 09/20/20 0644  WBC 14.4*  --  13.4* 14.5* 13.3* 14.2* 14.5*  NEUTROABS 11.4*  --   --   --   --   --   --   HGB 10.3*   < > 10.2* 10.8* 9.8* 10.0* 9.6*  HCT 32.4*   < > 31.3* 33.1* 30.9* 32.1* 29.9*  MCV 97.0  --  94.3 95.4 96.9 98.5 95.5  PLT 541*  --  451* 427* 358 289 234   < > = values in this interval not displayed.    Basic Metabolic Panel: Recent Labs  Lab 09/16/20 0340 09/17/20 0500 09/18/20 0530 09/19/20 0500 09/20/20 0644  NA 148* 151* 155* 153* 143  K 3.7 3.5 3.2* 3.0* 3.3*  CL 117* 124* 128* 126* 116*  CO2 21* 17* 21* 21* 21*  GLUCOSE 353* 414* 265* 89 96  BUN 75* 50* 33* 27* 18  CREATININE 2.76* 1.65* 1.14 0.92 0.72  CALCIUM 8.5* 8.4* 8.6* 8.5* 8.2*  MG  --   --   --   --  1.9    GFR: Estimated Creatinine Clearance: 79.7 mL/min (by C-G formula based on SCr of 0.72 mg/dL). Liver Function Tests: Recent Labs  Lab 09/15/20 0455 09/16/20 0340 09/17/20 0500 09/18/20 0530 09/20/20 0644  AST 56* 56* 51* 45* 41  ALT 89* 71* 61* 52* 40  ALKPHOS 199* 174* 172* 155* 131*  BILITOT 7.1* 5.4* 5.7* 5.1* 4.4*  PROT 6.5 5.7* 5.9* 5.7* 5.5*  ALBUMIN 2.6* 2.1*  2.2* 2.1* 2.0*    No results for input(s): LIPASE, AMYLASE in the last 168 hours. Recent Labs  Lab 09/15/20 0455  AMMONIA 49*    Coagulation Profile: No results for input(s): INR, PROTIME in the last 168 hours. Cardiac Enzymes: No results for input(s): CKTOTAL, CKMB, CKMBINDEX, TROPONINI in the last 168 hours. BNP (last 3 results) No results for input(s): PROBNP in the last 8760 hours. HbA1C: No results for input(s): HGBA1C in the last 72 hours. CBG: Recent Labs  Lab 09/19/20 1830 09/19/20 2126 09/20/20 0019 09/20/20 0533 09/20/20 0537  GLUCAP 187* 187* 137* 62* 64*    Lipid Profile: No results for input(s): CHOL, HDL, LDLCALC, TRIG, CHOLHDL,  LDLDIRECT in the last 72 hours. Thyroid Function Tests: No results for input(s): TSH, T4TOTAL, FREET4, T3FREE, THYROIDAB in the last 72 hours. Anemia Panel: No results for input(s): VITAMINB12, FOLATE, FERRITIN, TIBC, IRON, RETICCTPCT in the last 72 hours. Urine analysis:    Component Value Date/Time   COLORURINE AMBER (A) 09/15/2020 1204   APPEARANCEUR HAZY (A) 09/15/2020 1204   LABSPEC 1.013 09/15/2020 1204   PHURINE 5.0 09/15/2020 1204   GLUCOSEU >=500 (A) 09/15/2020 1204   HGBUR NEGATIVE 09/15/2020 [place] 09/15/2020 1204   KETONESUR NEGATIVE 09/15/2020 1204   PROTEINUR NEGATIVE 09/15/2020 1204   NITRITE NEGATIVE 09/15/2020 1204   LEUKOCYTESUR NEGATIVE 09/15/2020 1204   Sepsis Labs: '@LABRCNTIP'$ (procalcitonin:4,lacticidven:4)  ) Recent Results (from the past 240 hour(s))  SARS CORONAVIRUS 2 (TAT 6-24 HRS) Nasopharyngeal Nasopharyngeal Swab     Status: None   Collection Time: 09/15/20  8:05 AM   Specimen: Nasopharyngeal Swab  Result Value Ref Range Status   SARS Coronavirus 2 NEGATIVE NEGATIVE Final    Comment: (NOTE) SARS-CoV-2 target nucleic acids are NOT DETECTED.  The SARS-CoV-2 RNA is generally detectable in upper and lower respiratory specimens during the acute phase of infection.  Negative results do not preclude SARS-CoV-2 infection, do not rule out co-infections with other pathogens, and should not be used as the sole basis for treatment or other patient management decisions. Negative results must be combined with clinical observations, patient history, and epidemiological information. The expected result is Negative.  Fact Sheet for Patients: SugarRoll.be  Fact Sheet for Healthcare Providers: https://www.woods-mathews.com/  This test is not yet approved or cleared by the [place] FDA and  has been authorized for detection and/or diagnosis of SARS-CoV-2 by FDA under an Emergency Use Authorization (EUA). This EUA will remain  in effect (meaning this test can be used) for the duration of the COVID-19 declaration under Se ction 564(b)(1) of the Act, 21 U.S.C. section 360bbb-3(b)(1), unless the authorization is terminated or revoked sooner.  Performed at [facility], [place] [address] [place], [place] [address]   MRSA Next Gen by PCR, Nasal     Status: None   Collection Time: 09/15/20  5:33 PM   Specimen: Nasal Mucosa; Nasal Swab  Result Value Ref Range Status   MRSA by PCR Next Gen NOT DETECTED NOT DETECTED Final    Comment: (NOTE) The GeneXpert MRSA Assay (FDA approved for NASAL specimens only), is one component of a comprehensive MRSA colonization surveillance program. It is not intended to diagnose MRSA infection nor to guide or monitor treatment for MRSA infections. Test performance is not FDA approved in patients less than 6 years old. Performed at [facility], [place] [address] [place], [place] [address]   Blood culture (routine x 2)     Status: None   Collection Time: 09/15/20  6:02 PM   Specimen: BLOOD  Result Value Ref Range Status   Specimen Description BLOOD RIGHT ANTECUBITAL  Final   Special Requests   Final    BOTTLES DRAWN AEROBIC AND ANAEROBIC Blood Culture adequate volume    Culture   Final    NO GROWTH 5 DAYS Performed at [facility], [place] [address] [place], [place] [address]    Report Status 09/20/2020 FINAL  Final  Blood culture (routine x 2)     Status: None   Collection Time: 09/15/20  6:04 PM   Specimen: BLOOD  Result Value Ref Range Status   Specimen Description BLOOD LEFT  ANTECUBITAL  Final   Special Requests   Final    BOTTLES DRAWN AEROBIC AND ANAEROBIC Blood Culture results may not be optimal due to an inadequate volume of blood received in culture bottles   Culture   Final    NO GROWTH 5 DAYS Performed at [facility], [place] [address], [place], [place] [address]    Report Status 09/20/2020 FINAL  Final     Scheduled Meds:  chlorhexidine  15 mL Mouth Rinse BID   Chlorhexidine Gluconate Cloth  6 each Topical Daily   feeding supplement (PROSource TF)  45 mL Per Tube TID   free water  400 mL Per Tube Q4H   haloperidol  1 mg Per Tube QHS   heparin  5,000 Units Subcutaneous Q8H   insulin aspart  0-9 Units Subcutaneous Q6H   insulin glargine-yfgn  45 Units Subcutaneous BID   mouth rinse  15 mL Mouth Rinse q12n4p   pantoprazole sodium  40 mg Per Tube Daily   sodium chloride flush  3 mL Intravenous Q12H   sodium chloride flush  5 mL Intracatheter Daily   Continuous Infusions:  dextrose 50 mL/hr at 09/19/20 2103   feeding supplement (OSMOLITE 1.5 CAL) 1,000 mL (09/19/20 1831)     LOS: 5 days    Flora Lipps, MD [facility] 09/20/2020, 11:01 AM

## 2020-09-21 ENCOUNTER — Telehealth: Payer: Self-pay

## 2020-09-21 DIAGNOSIS — K831 Obstruction of bile duct: Secondary | ICD-10-CM | POA: Diagnosis not present

## 2020-09-21 DIAGNOSIS — E1110 Type 2 diabetes mellitus with ketoacidosis without coma: Secondary | ICD-10-CM | POA: Diagnosis not present

## 2020-09-21 DIAGNOSIS — N179 Acute kidney failure, unspecified: Secondary | ICD-10-CM | POA: Diagnosis not present

## 2020-09-21 LAB — GLUCOSE, CAPILLARY
Glucose-Capillary: 103 mg/dL — ABNORMAL HIGH (ref 70–99)
Glucose-Capillary: 179 mg/dL — ABNORMAL HIGH (ref 70–99)

## 2020-09-21 MED ORDER — ALBUTEROL SULFATE HFA 108 (90 BASE) MCG/ACT IN AERS: 2.0000 | INHALATION_SPRAY | Freq: Four times a day (QID) | RESPIRATORY_TRACT | 2 refills | Status: AC | PRN

## 2020-09-21 MED ORDER — INSULIN ASPART 100 UNIT/ML IJ SOLN: 0.0000 [IU] | Freq: Four times a day (QID) | INTRAMUSCULAR | 11 refills | Status: AC

## 2020-09-21 MED ORDER — INSULIN GLARGINE-YFGN 100 UNIT/ML ~~LOC~~ SOLN
40.0000 [IU] | Freq: Two times a day (BID) | SUBCUTANEOUS | Status: DC
  Administered 2020-09-21: 40 [IU] via SUBCUTANEOUS
  Filled 2020-09-21 (×2): qty 0.4

## 2020-09-21 MED ORDER — PANTOPRAZOLE SODIUM 40 MG PO PACK: 40.0000 mg | PACK | Freq: Every day | ORAL | 0 refills | Status: AC

## 2020-09-21 MED ORDER — CHLORHEXIDINE GLUCONATE 0.12 % MT SOLN: 15.0000 mL | Freq: Two times a day (BID) | OROMUCOSAL | 0 refills | Status: AC

## 2020-09-21 MED ORDER — INSULIN GLARGINE-YFGN 100 UNIT/ML ~~LOC~~ SOLN: 40.0000 [IU] | Freq: Two times a day (BID) | SUBCUTANEOUS | 11 refills | Status: AC

## 2020-09-21 MED ORDER — ONDANSETRON HCL 4 MG PO TABS: 4.0000 mg | ORAL_TABLET | Freq: Four times a day (QID) | ORAL | 0 refills | Status: AC | PRN

## 2020-09-21 MED ORDER — FREE WATER
200.0000 mL | Status: DC
  Administered 2020-09-21 (×2): 200 mL

## 2020-09-21 MED ORDER — PROMETHAZINE HCL 12.5 MG PO TABS: 12.5000 mg | ORAL_TABLET | Freq: Four times a day (QID) | ORAL | 0 refills | Status: AC | PRN

## 2020-09-21 NOTE — Telephone Encounter (Signed)
-----   Message from Derwood Kaplan, MD sent at 09/21/2020  2:24 PM EDT ----- Regarding: RE: Admitted to [facility] w/DKA, tube feeding questions - call from Dr Rocky Link That may have been my fault, I had told them to stop the insulin since he was also on so many meds. If he tolerates it better, I agree with bolus feeds,.  ----- Message ----- From: Dairl Ponder, RN Sent: 09/21/2020   2:06 PM EDT To: Derwood Kaplan, MD Subject: Admitted to [facility] w/DKA, tube feeding questio#  Dr Rocky Link called to make you aware that pt was taken to [place] admitted with DKA. 1. She states, "There was a misadventure with his insulin & Hospice. Dr Rocky Link wanted you to be aware, in case you receive a call. While inpatient they stopped all of his oral diabetic medication, & just treated with insulin. He will be discharged home on Lantus or Levemir. 2. Tube feedings  - Pt had been taking bolus feeds 50 mL TID @ home, & tolerating it fine. When he was in hospital they switched him to continuous feeds and he stayed nauseated. Dietary recommends pt continue continuous feedings at home, but Hospice recommends that he goes back to the bolus feeds.

## 2020-09-21 NOTE — Discharge Summary (Signed)
Physician Discharge Summary  Jeff Jordan [id number] DOB: March 09, 1955 DOA: 09/15/2020  PCP: Georganna Skeans, PA-C  Admit date: 09/15/2020 Discharge date: 09/21/2020  Admitted From: Home hospice  Discharge disposition: Home hospice  Recommendations for Outpatient Follow-Up:   Follow up with hospice care provider   Discharge Diagnosis:   Principal Problem:   DKA, type 2 ([place]) Active Problems:   Pancreatic adenocarcinoma ([place])   Biliary obstruction   Severe protein-calorie malnutrition [place] Oiler: less than 60% of standard weight) ([place])   SIRS (systemic inflammatory response syndrome) ([facility])   Acute renal failure (ARF) ([facility])   Transient hypotension   Thrombocytosis   Transaminitis   Hyperbilirubinemia   S/P percutaneous endoscopic gastrostomy (PEG) tube placement [facility])   Discharge Condition: Stable for home hospice  Diet recommendation: Clear liquids, tube feeding  Wound care: None.  Code status: DNR   History of Present Illness:   Jeff Jordan is a 65 y.o. 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  In the previous admission, patient was treated for Candida and staph epidermidis which were treated with Eraxis and IV Unasyn, G-tube was placed due to gastric outlet obstruction on IR on 8/1.  Patient was then considered for home hospice due to frailty of the patient.  Patient was on home hospice when he started having worsening mental status low blood pressure and was noted to have acute kidney injury with DKA.  Patient was then admitted to hospital for further evaluation and treatment.  He was given IV fluids insulin drip and initially was on empiric antibiotic which was subsequently discontinued.    Hospital Course:   Following conditions were addressed during hospitalization as  listed below,  DKA type II with anion gap metabolic acidosis  Initial blood glucose level of up to 656 with CO2 14, anion gap 21, venous pH 7.199, and beta hydroxybutyrate 1.88.  DKA has corrected at this time.  On sliding scale insulin and glargine.  On tube feeds.   Patient will be prescribed sliding scale insulin and long-acting insulin on discharge.   Vomiting and diarrhea.  On tube feeds.  Continue antiemetics.  Continue loperamide.    Significant hypokalemia.  Improved after replacement.   Systemic inflammatory response syndrome/lactic acidosis. Likely multifactorial from DKA, AKI and dehydration.  .  Blood cultures negative in 5 days.   Hypernatremia Required D5 water.  Now improved   Hypotension Resolved.  Antihypertensives including amlodipine Coreg irbesartan and Benicar on hold.  Will be resumed on amlodipine and Coreg on discharge.  Acute metabolic encephalopathy:  Likely multifactorial from DKA dehydration.  Stable.   Acute renal failure secondary to severe dehydration:  Resolved after IV fluids.  Currently at 0.7.  Creatinine on presentation was 5.8.     Pancreatic adenocarcinoma with biliary obstruction and obstructive jaundice.:  Continue biliary drain. No plans for exchanging it at this time after discussing with the family.   Ethics/ goals of care.  Palliative care the patient during hospitalization.  Patient is to continue home hospice on discharge.    Severe protein calorie malnutrition/dysphagia s/p G-tube:  Continue tube feeds, seen by nutritionist during hospitalization  Disposition.  At this time, patient is stable for disposition home with home hospice set up  Medical Consultants:   Palliative care IR  Procedures:    None Subjective:   Today, patient  was seen and examined at bedside.  Nursing staff reported that he had some issues with nausea and vomiting.  Currently denies vomiting.  Discharge Exam:   Vitals:   09/21/20 0821 09/21/20 0832   BP: (!) 141/59   Pulse: 98   Resp: (!) 26 20  Temp: 98.3 F (36.8 C)   SpO2:     Vitals:   09/21/20 0346 09/21/20 0544 09/21/20 0821 09/21/20 0832  BP: (!) 149/62  (!) 141/59   Pulse: (!) 105  98   Resp: 15  (!) 26 20  Temp: 99.3 F (37.4 C)  98.3 F (36.8 C)   TempSrc: Oral  Oral   SpO2: 100%     Weight:  64.7 kg     General: Alert awake, not in obvious distress, chronically ill, HENT: pupils equally reacting to light, icterus noted.  Oral mucosa is moist.  Chest:   Diminished breath sounds bilaterally. No crackles or wheezes.  CVS: S1 &S2 heard. No murmur.  Regular rate and rhythm. Abdomen: Soft, nontender, nondistended.  Bowel sounds are heard.  G-tube in place, biliary drain in place Extremities: No cyanosis, clubbing or edema.  Peripheral pulses are palpable. Psych: Alert, awake and communicative. CNS:  No cranial nerve deficits.  Moves all extremities. Skin: Warm and dry.  No rashes noted.  The results of significant diagnostics from this hospitalization (including imaging, microbiology, ancillary and laboratory) are listed below for reference.     Diagnostic Studies:   No results found.   Labs:   Basic Metabolic Panel: Recent Labs  Lab 09/16/20 0340 09/17/20 0500 09/18/20 0530 09/19/20 0500 09/20/20 0644  NA 148* 151* 155* 153* 143  K 3.7 3.5 3.2* 3.0* 3.3*  CL 117* 124* 128* 126* 116*  CO2 21* 17* 21* 21* 21*  GLUCOSE 353* 414* 265* 89 96  BUN 75* 50* 33* 27* 18  CREATININE 2.76* 1.65* 1.14 0.92 0.72  CALCIUM 8.5* 8.4* 8.6* 8.5* 8.2*  MG  --   --   --   --  1.9   GFR Estimated Creatinine Clearance: 82 mL/min (by C-G formula based on SCr of 0.72 mg/dL). Liver Function Tests: Recent Labs  Lab 09/15/20 0455 09/16/20 0340 09/17/20 0500 09/18/20 0530 09/20/20 0644  AST 56* 56* 51* 45* 41  ALT 89* 71* 61* 52* 40  ALKPHOS 199* 174* 172* 155* 131*  BILITOT 7.1* 5.4* 5.7* 5.1* 4.4*  PROT 6.5 5.7* 5.9* 5.7* 5.5*  ALBUMIN 2.6* 2.1* 2.2* 2.1* 2.0*    No results for input(s): LIPASE, AMYLASE in the last 168 hours. Recent Labs  Lab 09/15/20 0455  AMMONIA 49*   Coagulation profile No results for input(s): INR, PROTIME in the last 168 hours.  CBC: Recent Labs  Lab 09/15/20 0455 09/15/20 0759 09/16/20 0340 09/17/20 0500 09/18/20 0530 09/19/20 0500 09/20/20 0644  WBC 14.4*  --  13.4* 14.5* 13.3* 14.2* 14.5*  NEUTROABS 11.4*  --   --   --   --   --   --   HGB 10.3*   < > 10.2* 10.8* 9.8* 10.0* 9.6*  HCT 32.4*   < > 31.3* 33.1* 30.9* 32.1* 29.9*  MCV 97.0  --  94.3 95.4 96.9 98.5 95.5  PLT 541*  --  451* 427* 358 289 234   < > = values in this interval not displayed.   Cardiac Enzymes: No results for input(s): CKTOTAL, CKMB, CKMBINDEX, TROPONINI in the last 168 hours. BNP: Invalid input(s): POCBNP CBG: Recent Labs  Lab 09/20/20  1214 09/20/20 1750 09/20/20 1822 09/20/20 2337 09/21/20 0550  GLUCAP 80 67* 125* 101* 103*   D-Dimer No results for input(s): DDIMER in the last 72 hours. Hgb A1c No results for input(s): HGBA1C in the last 72 hours. Lipid Profile No results for input(s): CHOL, HDL, LDLCALC, TRIG, CHOLHDL, LDLDIRECT in the last 72 hours. Thyroid function studies No results for input(s): TSH, T4TOTAL, T3FREE, THYROIDAB in the last 72 hours.  Invalid input(s): FREET3 Anemia work up No results for input(s): VITAMINB12, FOLATE, FERRITIN, TIBC, IRON, RETICCTPCT in the last 72 hours. Microbiology Recent Results (from the past 240 hour(s))  SARS CORONAVIRUS 2 (TAT 6-24 HRS) Nasopharyngeal Nasopharyngeal Swab     Status: None   Collection Time: 09/15/20  8:05 AM   Specimen: Nasopharyngeal Swab  Result Value Ref Range Status   SARS Coronavirus 2 NEGATIVE NEGATIVE Final    Comment: (NOTE) SARS-CoV-2 target nucleic acids are NOT DETECTED.  The SARS-CoV-2 RNA is generally detectable in upper and lower respiratory specimens during the acute phase of infection. Negative results do not preclude SARS-CoV-2  infection, do not rule out co-infections with other pathogens, and should not be used as the sole basis for treatment or other patient management decisions. Negative results must be combined with clinical observations, patient history, and epidemiological information. The expected result is Negative.  Fact Sheet for Patients: SugarRoll.be  Fact Sheet for Healthcare Providers: https://www.woods-mathews.com/  This test is not yet approved or cleared by the [place] FDA and  has been authorized for detection and/or diagnosis of SARS-CoV-2 by FDA under an Emergency Use Authorization (EUA). This EUA will remain  in effect (meaning this test can be used) for the duration of the COVID-19 declaration under Se ction 564(b)(1) of the Act, 21 U.S.C. section 360bbb-3(b)(1), unless the authorization is terminated or revoked sooner.  Performed at [facility], [place] [address] [place], [place] [address]   MRSA Next Gen by PCR, Nasal     Status: None   Collection Time: 09/15/20  5:33 PM   Specimen: Nasal Mucosa; Nasal Swab  Result Value Ref Range Status   MRSA by PCR Next Gen NOT DETECTED NOT DETECTED Final    Comment: (NOTE) The GeneXpert MRSA Assay (FDA approved for NASAL specimens only), is one component of a comprehensive MRSA colonization surveillance program. It is not intended to diagnose MRSA infection nor to guide or monitor treatment for MRSA infections. Test performance is not FDA approved in patients less than 40 years old. Performed at [facility], [place] [address], [place], [place] [address]   Blood culture (routine x 2)     Status: None   Collection Time: 09/15/20  6:02 PM   Specimen: BLOOD  Result Value Ref Range Status   Specimen Description BLOOD RIGHT ANTECUBITAL  Final   Special Requests   Final    BOTTLES DRAWN AEROBIC AND ANAEROBIC Blood Culture adequate volume   Culture   Final    NO GROWTH 5 DAYS Performed  at [facility], [place] [address], [place], [place] [address]    Report Status 09/20/2020 FINAL  Final  Blood culture (routine x 2)     Status: None   Collection Time: 09/15/20  6:04 PM   Specimen: BLOOD  Result Value Ref Range Status   Specimen Description BLOOD LEFT ANTECUBITAL  Final   Special Requests   Final    BOTTLES DRAWN AEROBIC AND ANAEROBIC Blood Culture results may not be optimal due to  an inadequate volume of blood received in culture bottles   Culture   Final    NO GROWTH 5 DAYS Performed at [facility], [place] [address], [place], [place] [address]    Report Status 09/20/2020 FINAL  Final     Discharge Instructions:   Discharge Instructions     Diet clear liquid   Complete by: As directed    Discharge instructions   Complete by: As directed    Follow-up with the hospice care provider.      Allergies as of 09/21/2020   No Known Allergies      Medication List     STOP taking these medications    ferrous sulfate 325 (65 FE) MG tablet   irbesartan 300 MG tablet Commonly known as: AVAPRO   Jardiance 25 MG Tabs tablet Generic drug: empagliflozin   metFORMIN 1000 MG tablet Commonly known as: GLUCOPHAGE   olmesartan 40 MG tablet Commonly known as: BENICAR   omeprazole 40 MG capsule Commonly known as: PRILOSEC       TAKE these medications    albuterol 108 (90 Base) MCG/ACT inhaler Commonly known as: VENTOLIN HFA Inhale 2 puffs into the lungs every 6 (six) hours as needed for wheezing or shortness of breath.   amLODipine 5 MG tablet Commonly known as: NORVASC Take 5 mg by mouth daily.   carvedilol 6.25 MG tablet Commonly known as: COREG Take 1 tablet (6.25 mg total) by mouth 2 (two) times daily.   chlorhexidine 0.12 % solution Commonly known as: PERIDEX 15 mLs by Mouth Rinse route 2 (two) times daily.   feeding supplement (OSMOLITE 1.5 CAL) Liqd Place 1,000 mLs into feeding tube continuous.   free water Soln Place 200 mLs  into feeding tube every 4 (four) hours.   haloperidol 1 MG tablet Commonly known as: HALDOL 1 tab po q HS, and every 4 hours as needed for agitation. What changed:  how much to take how to take this when to take this additional instructions   insulin aspart 100 UNIT/ML injection Commonly known as: novoLOG Inject 0-9 Units into the skin every 6 (six) hours.   insulin glargine-yfgn 100 UNIT/ML injection Commonly known as: SEMGLEE Inject 0.4 mLs (40 Units total) into the skin 2 (two) times daily.   loperamide 2 MG capsule Commonly known as: IMODIUM Take 2 mg by mouth as needed for diarrhea or loose stools.   ondansetron 4 MG tablet Commonly known as: ZOFRAN Place 1 tablet (4 mg total) into feeding tube every 6 (six) hours as needed for nausea or vomiting. What changed:  how to take this when to take this reasons to take this   Oxycodone HCl 10 MG Tabs Take 1 tablet (10 mg total) by mouth every 4 (four) hours as needed. What changed: reasons to take this   pantoprazole sodium 40 mg Pack Commonly known as: PROTONIX Place 20 mLs (40 mg total) into feeding tube daily. Start taking on: September 22, 2020   promethazine 12.5 MG tablet Commonly known as: PHENERGAN Take 1 tablet (12.5 mg total) by mouth every 6 (six) hours as needed for nausea or vomiting.        Time coordinating discharge: 39 minutes  Signed:  Trinidi Toppins  [facility] 09/21/2020, 12:54 PM

## 2020-09-21 NOTE — Progress Notes (Signed)
   I have spoke to Dominica pt's daughter she is aware pt is ready for d/c home. She is in agreement for him to come home. She will need him to come home by ambulance which will be set up by transitions of care dept. The pt's daughter Lorriane Shire will call us when the pt gets home so that we can have our nurse come out and go over d/c instructions begin teaching of feedings recommended and medications management.   Webb Silversmith RN [phone]

## 2020-09-21 NOTE — Progress Notes (Signed)
Nutrition Follow-up  DOCUMENTATION CODES:   Severe malnutrition in context of chronic illness  INTERVENTION:   Continue continuous tube feeds via PEG: - Osmolite 1.5 @ 55 ml/hr (1320 ml/day) - ProSource TF 45 ml TID - Free water flushes of 200 ml q 4 hours   Tube feeding regimen provides 2100 kcal, 116 grams of protein, and 1006 ml of H2O.   Total free water with flushes: 2206 ml   Bolus tube feeding recommendations: - 355 ml (1.5 cartons) Osmolite 1.5 cal formula QID (total of 6 cartons daily) - ProSource TF 45 ml BID - Free water flushes of 200 ml q 4 hours  Bolus tube feeding regimen provides 2210 kcal, 111 grams of protein, and 1086 ml of H2O.  Total free water with flushes: 2286 ml  NUTRITION DIAGNOSIS:   Severe Malnutrition related to chronic illness, cancer and cancer related treatments as evidenced by severe fat depletion, severe muscle depletion.  Ongoing, being addressed via TF  GOAL:   Patient will meet greater than or equal to 90% of their needs  Met via TF  MONITOR:   TF tolerance, Labs, Weight trends, I & O's  REASON FOR ASSESSMENT:   Malnutrition Screening Tool    ASSESSMENT:   65 y.o. 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 Recently hospitalized with malignant biliary obstruction due to pancreatic adenocarcinoma, underwent external biliary drain placement by IR on 7/21 after GI was unable to place stent with ERCP, cultures grew Candida and staph epidermidis which were treated with Eraxis and IV Unasyn, G-tube was placed due to gastric outlet obstruction in IR on 8/1. Admitted with DKA.  8/24 - diet advanced to clear liquids  Case Manager requested that RD transition pt to bolus tube feeding regimen prior to discharge home with hospice.  Discussed pt with RN. Pt with  uncontrolled nausea. Noted pt with an episode of emesis this morning described as brown, mucous, green. Pt currently on a clear liquid diet but refusing POs per RN due to uncontrolled nausea.  Discussed pt with RN and MD. Will continue on continuous tube feeds today rather than transitioning to bolus given emesis and and nausea. Plan is for pt to discharge home on hospice today with continuous tube feeds in place. RD to leave bolus tube feeding recommendations.  Admit weight: 59.5 kg Current weight: 64.7 kg  Pt's weight is up compared to admit weight. Pt with non-pitting edema to LUE and BLE per nursing documentation. RD to decrease free water flushes as pt's sodium today was 143.  Current TF: Osmolite 1.5 @ 55 ml/hr, ProSource TF 45 ml TID, free water flushes of 400 ml q 4 hours  Medications reviewed and include: SSI q 6 hours, semglee 40 units BID, protonix  Labs reviewed (8/25): potassium 3.3, hemoglobin 9.6 CBG's: 67-125 x 24 hours  UOP: 950 ml x 24 hours Biliary drain: 300 ml x 24 hours I/O's: +7.7 L since admit  Diet Order:   Diet Order             Diet clear liquid Room service appropriate? Yes; Fluid consistency: Thin  Diet effective now                   EDUCATION NEEDS:   No education needs have been identified at this time  Skin:  Skin Assessment: Reviewed RN Assessment  Last  BM:  09/20/20 medium type 7  Height:   Ht Readings from Last 1 Encounters:  08/14/20 _0  (1.778 m)    Weight:   Wt Readings from Last 1 Encounters:  09/21/20 64.7 kg    BMI:  Body mass index is 20.47 kg/m.  Estimated Nutritional Needs:   Kcal:  2050-2250  Protein:  105-120 grams  Fluid:  >2 L    Gustavus Bryant, MS, RD, LDN Inpatient Clinical Dietitian Please see AMiON for contact information.

## 2020-09-21 NOTE — Progress Notes (Signed)
Palliative Medicine RN Note: Rec'd a call from pt's RN Sarah. Pt is only tolerating continuous feeding, not bolus feeds. Otherwise, pt is stable for d/c.   If Jeff Jordan cannot tolerate bolus feeds, PMT recommends continuous feeds at home to avoid GI symptoms. Hospice has already been set up and should be available to support the family with this.  [name], RN, BSN, [facility] Palliative Medicine Team 09/21/2020 11:14 AM Office [phone]

## 2020-10-27 DEATH — deceased

## 2022-06-21 IMAGING — XA IR PERC PLACEMENT GASTROSTOMY
7 series · 12 of 24 positions shown · non-contrast
Comparison: none

INDICATION: 64-year-old male with metastatic pancreatic cancer with possible
duodenal obstruction.

[Series 2: fl - angio · 2 of 33 frames shown (1 of 6)]
[frame 17/33]
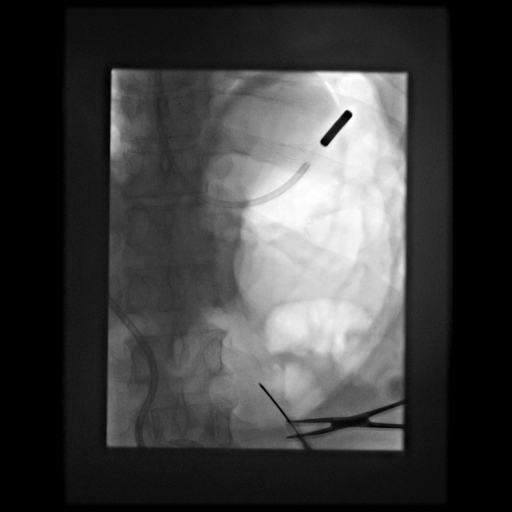
[frame 33/33]
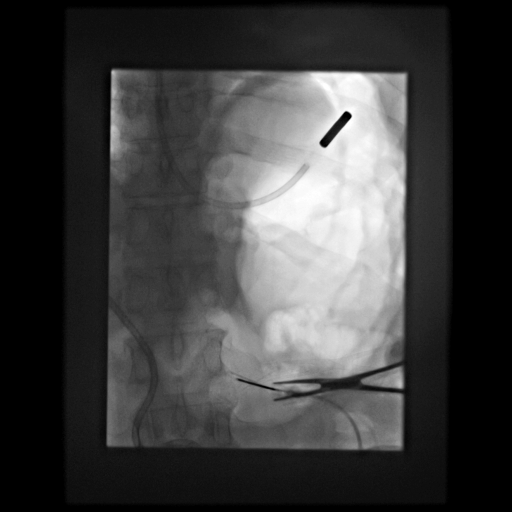

[Series 3: fl - angio · 1 of 14 frames shown (2 of 6)]
[frame 8/14]
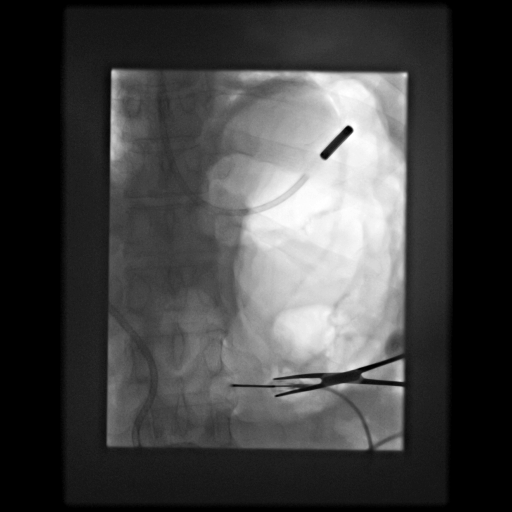

[Series 4: fl - angio · 2 of 15 frames shown (3 of 6)]
[frame 3/15]
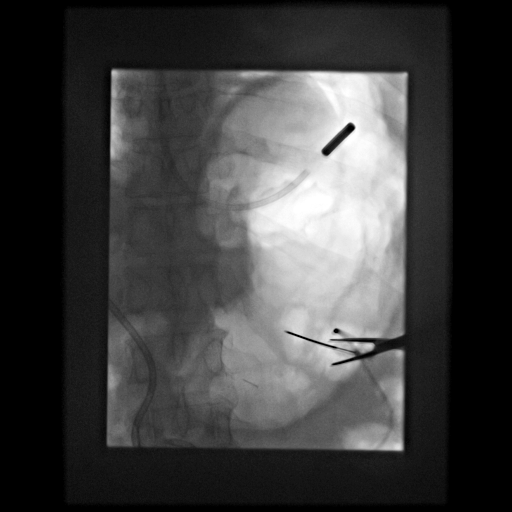
[frame 11/15]
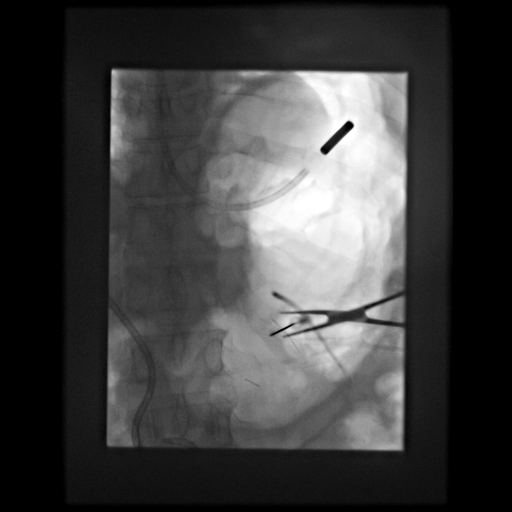

[Series 5: fl - angio · 2 of 31 frames shown (4 of 6)]
[frame 5/31]
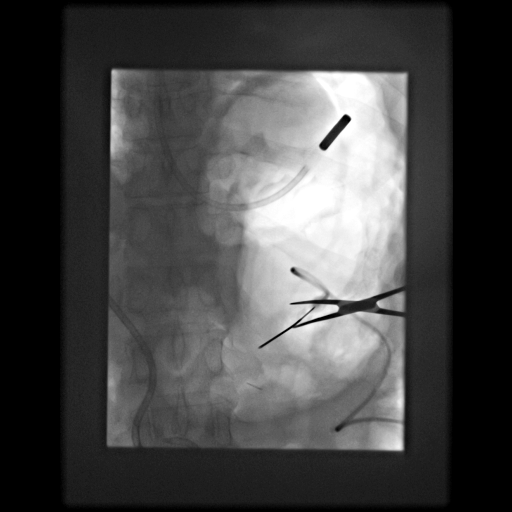
[frame 28/31]
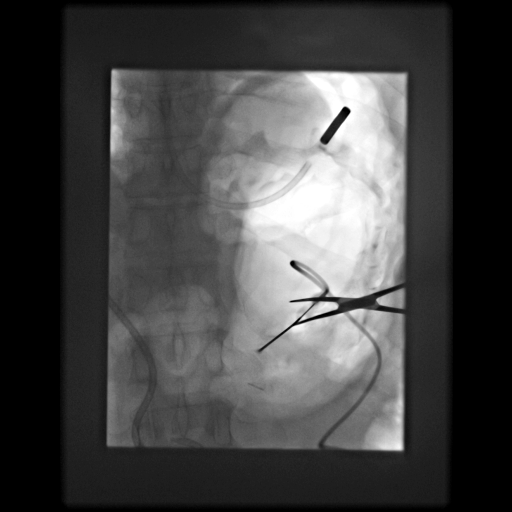

[Series 6: fl - angio · 1 of 8 frames shown (5 of 6)]
[frame 5/8]
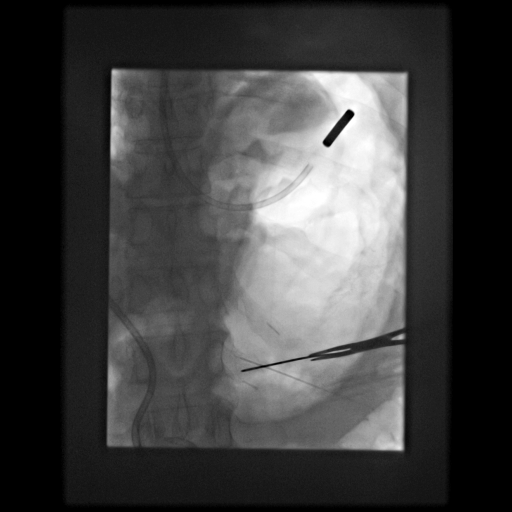

[Series 7: fl - angio · 2 of 12 frames shown (6 of 6)]
[frame 2/12]
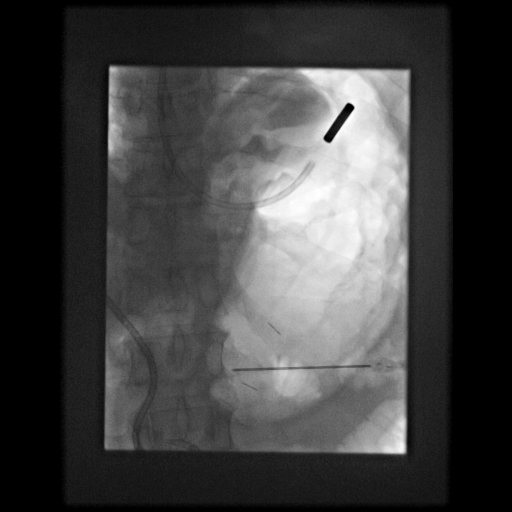
[frame 11/12]
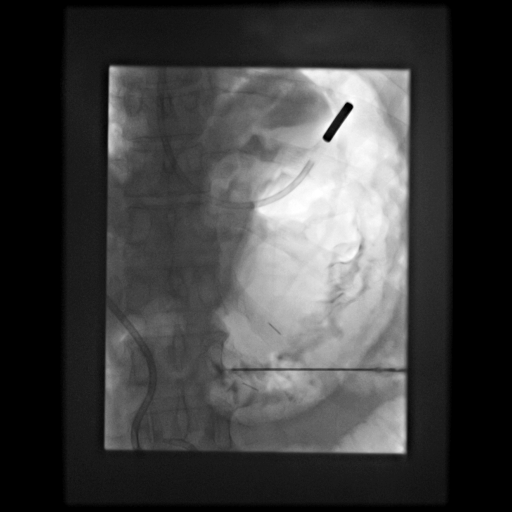

[Series 300: ir gastrostomy tube mod sed · 2 of 4 slices shown]
[im 2/4]
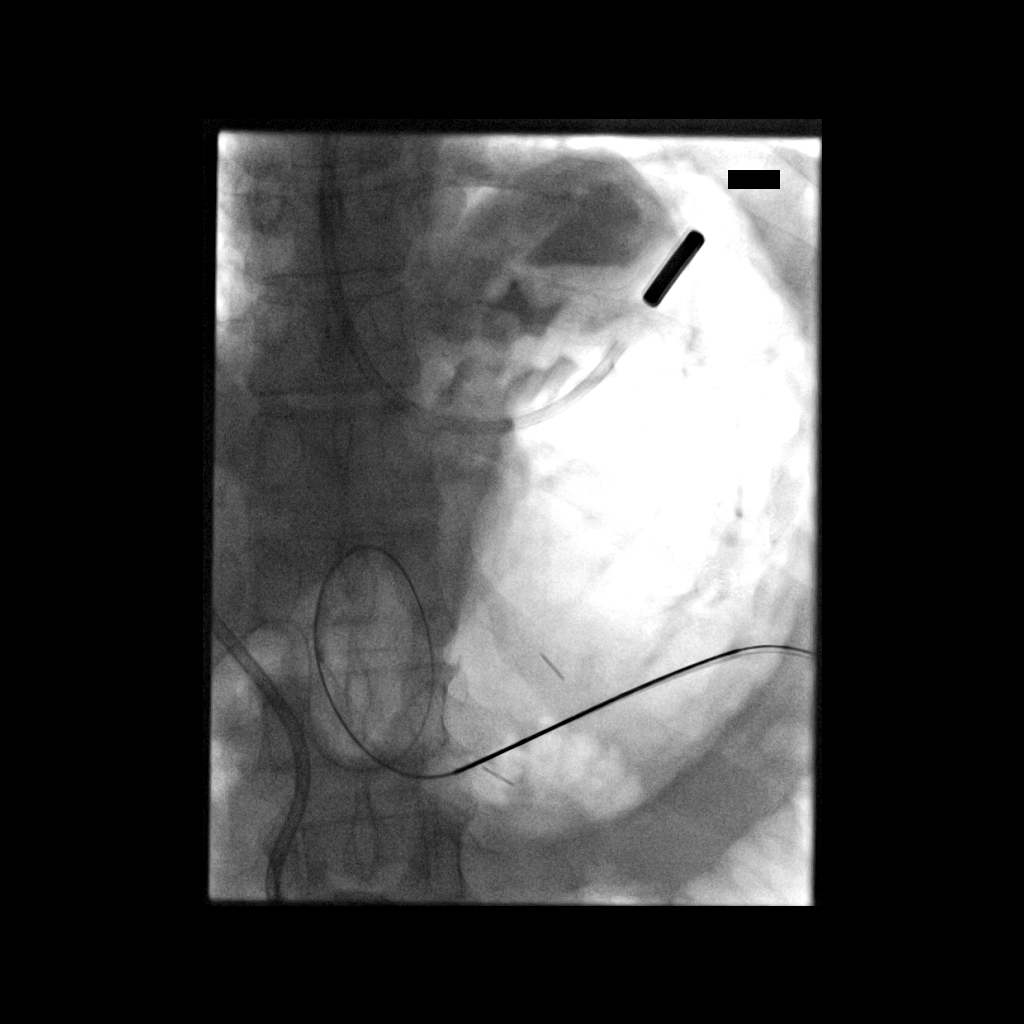
[im 4/4]
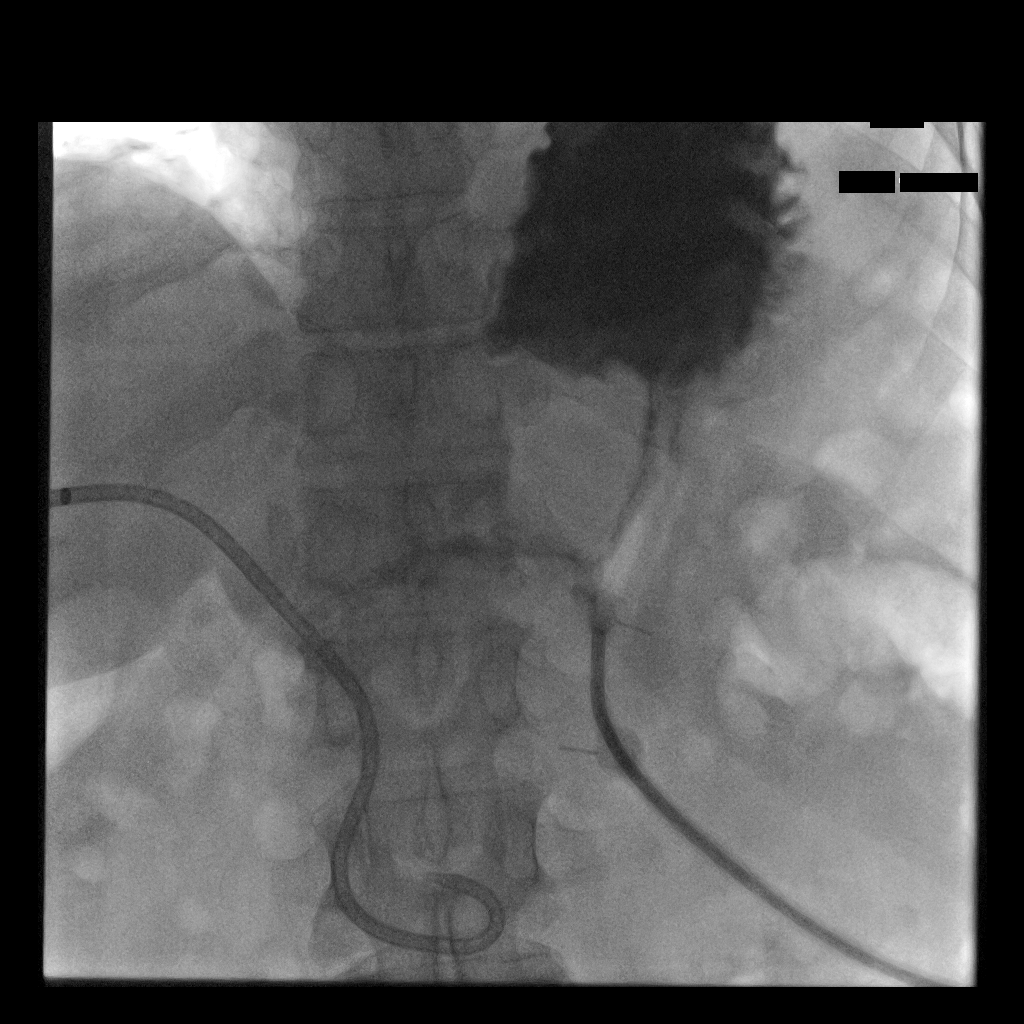

[12 of 24 positions shown; findings below may reference images not displayed]

EXAM:
PERC PLACEMENT GASTROSTOMY

MEDICATIONS:
Vancomycin 1 IV; Antibiotics were administered within 1 hour of the
procedure.

ANESTHESIA/SEDATION:
The patient was continuously monitored during the procedure by the
interventional radiology nurse under my direct supervision.

Versed 2 mg IV; dilaudid 1 mg IV

Moderate Sedation Time:  24

CONTRAST:  60mL OMNIPAQUE IOHEXOL 300 MG/ML SOLN - administered into
the gastric lumen.

FLUOROSCOPY TIME:  Fluoroscopy Time: 4 minutes 12 seconds (58 mGy).

COMPLICATIONS:
None immediate.

PROCEDURE:
Informed written consent was obtained from the patient after a
thorough discussion of the procedural risks, benefits and
alternatives. All questions were addressed. Maximal Sterile Barrier
Technique was utilized including caps, mask, sterile gowns, sterile
gloves, sterile drape, hand hygiene and skin antiseptic. A timeout
was performed prior to the initiation of the procedure.

The patient was placed on the procedure table in the supine
position. Pre-procedure abdominal film confirmed visualization of
the transverse colon. The patient was prepped and draped in usual
sterile fashion. The stomach was insufflated with air via the
indwelling nasogastric tube. Under fluoroscopy, a puncture site was
selected and local analgesia achieved with 1% lidocaine infiltrated
subcutaneously.

Under fluoroscopic guidance, a gastropexy needle was passed into the
stomach and the T-bar suture was released. Entry into the stomach
was confirmed with fluoroscopy, aspiration of air, and injection of
contrast material. This was repeated with an additional gastropexy
suture (for a total of 2 fasteners). At the center of these
gastropexy sutures, a dermatotomy was performed. An 18 gauge needle
was passed into the stomach at the site of this dermatotomy, and
position within the gastric lumen again confirmed under fluoroscopy
using aspiration of air and contrast injection. An Amplatz guidewire
was passed through this needle and intraluminal placement within the
stomach was confirmed by fluoroscopy. The needle was removed. Over
the guidewire, the percutaneous tract was dilated using a 10 mm
non-compliant balloon. The balloon was deflated, then pushed into
the gastric lumen followed in concert by the 20 Fr gastrostomy tube.

The retention balloon of the percutaneous gastrostomy tube was
inflated with 20 mL of sterile water. The tube was withdrawn until
the retention balloon was at the edge of the gastric lumen. The
external bumper was brought to the

abdominal wall. Contrast was injected through the gastrostomy tube,
confirming intraluminal positioning. Attempted was made it jejunal
tube placement, however the pylorus was unable be engaged.

The patient tolerated the procedure well without any immediate
post-procedural complications.
IMPRESSION: Technically successful placement of 20 Fr gastrostomy tube.
Unsuccessful attempted jejunostomy tube placement due to inability
to cannulate the pylorus.

PLAN:
Attempt at jejunal arm placement at a later date could be pursued
upon request as clinically indicated.
